# Patient Record
Sex: Female | Born: 1980 | Hispanic: No | Smoking: Never smoker
Health system: Southern US, Community
[De-identification: ages and names within clinical notes are randomized; demographics above are authoritative.]

## PROBLEM LIST (undated history)

## (undated) ENCOUNTER — Inpatient Hospital Stay (HOSPITAL_COMMUNITY): Payer: Self-pay

## (undated) DIAGNOSIS — O99119 Other diseases of the blood and blood-forming organs and certain disorders involving the immune mechanism complicating pregnancy, unspecified trimester: Secondary | ICD-10-CM

## (undated) DIAGNOSIS — D696 Thrombocytopenia, unspecified: Secondary | ICD-10-CM

## (undated) DIAGNOSIS — F419 Anxiety disorder, unspecified: Secondary | ICD-10-CM

## (undated) DIAGNOSIS — F32A Depression, unspecified: Secondary | ICD-10-CM

## (undated) DIAGNOSIS — F329 Major depressive disorder, single episode, unspecified: Secondary | ICD-10-CM

## (undated) DIAGNOSIS — D509 Iron deficiency anemia, unspecified: Secondary | ICD-10-CM

## (undated) DIAGNOSIS — O09529 Supervision of elderly multigravida, unspecified trimester: Secondary | ICD-10-CM

## (undated) DIAGNOSIS — O09299 Supervision of pregnancy with other poor reproductive or obstetric history, unspecified trimester: Secondary | ICD-10-CM

## (undated) DIAGNOSIS — Z22330 Carrier of Group B streptococcus: Secondary | ICD-10-CM

## (undated) DIAGNOSIS — Z3480 Encounter for supervision of other normal pregnancy, unspecified trimester: Secondary | ICD-10-CM

## (undated) DIAGNOSIS — K219 Gastro-esophageal reflux disease without esophagitis: Secondary | ICD-10-CM

## (undated) DIAGNOSIS — D649 Anemia, unspecified: Secondary | ICD-10-CM

## (undated) HISTORY — DX: Thrombocytopenia, unspecified: D69.6

## (undated) HISTORY — DX: Supervision of pregnancy with other poor reproductive or obstetric history, unspecified trimester: O09.299

## (undated) HISTORY — DX: Carrier of group B Streptococcus: Z22.330

## (undated) HISTORY — DX: Supervision of elderly multigravida, unspecified trimester: O09.529

## (undated) HISTORY — DX: Iron deficiency anemia, unspecified: D50.9

## (undated) HISTORY — DX: Encounter for supervision of other normal pregnancy, unspecified trimester: Z34.80

## (undated) HISTORY — DX: Other diseases of the blood and blood-forming organs and certain disorders involving the immune mechanism complicating pregnancy, unspecified trimester: O99.119

## (undated) HISTORY — PX: ESOPHAGUS SURGERY: SHX626

---

## 2002-09-03 ENCOUNTER — Encounter: Admission: RE | Admit: 2002-09-03 | Discharge: 2002-09-03 | Payer: Self-pay | Admitting: Family Medicine

## 2002-09-06 ENCOUNTER — Encounter: Admission: RE | Admit: 2002-09-06 | Discharge: 2002-09-06 | Payer: Self-pay | Admitting: Family Medicine

## 2002-09-24 ENCOUNTER — Encounter: Admission: RE | Admit: 2002-09-24 | Discharge: 2002-09-24 | Payer: Self-pay | Admitting: Family Medicine

## 2002-10-22 ENCOUNTER — Encounter: Admission: RE | Admit: 2002-10-22 | Discharge: 2002-10-22 | Payer: Self-pay | Admitting: Sports Medicine

## 2002-12-17 ENCOUNTER — Encounter: Admission: RE | Admit: 2002-12-17 | Discharge: 2002-12-17 | Payer: Self-pay | Admitting: Family Medicine

## 2003-01-25 ENCOUNTER — Ambulatory Visit (HOSPITAL_COMMUNITY): Admission: RE | Admit: 2003-01-25 | Discharge: 2003-01-25 | Payer: Self-pay | Admitting: Gastroenterology

## 2003-03-07 ENCOUNTER — Encounter: Admission: RE | Admit: 2003-03-07 | Discharge: 2003-03-07 | Payer: Self-pay | Admitting: Gastroenterology

## 2003-04-23 ENCOUNTER — Ambulatory Visit (HOSPITAL_COMMUNITY): Admission: RE | Admit: 2003-04-23 | Discharge: 2003-04-23 | Payer: Self-pay | Admitting: Gastroenterology

## 2003-04-26 ENCOUNTER — Encounter: Admission: RE | Admit: 2003-04-26 | Discharge: 2003-04-26 | Payer: Self-pay | Admitting: Family Medicine

## 2003-04-29 ENCOUNTER — Encounter: Admission: RE | Admit: 2003-04-29 | Discharge: 2003-04-29 | Payer: Self-pay | Admitting: Family Medicine

## 2003-05-10 ENCOUNTER — Ambulatory Visit (HOSPITAL_COMMUNITY): Admission: RE | Admit: 2003-05-10 | Discharge: 2003-05-10 | Payer: Self-pay | Admitting: Gastroenterology

## 2003-09-27 ENCOUNTER — Inpatient Hospital Stay (HOSPITAL_COMMUNITY): Admission: RE | Admit: 2003-09-27 | Discharge: 2003-09-29 | Payer: Self-pay | Admitting: General Surgery

## 2004-02-10 ENCOUNTER — Ambulatory Visit: Payer: Self-pay | Admitting: Family Medicine

## 2004-03-18 ENCOUNTER — Ambulatory Visit: Payer: Self-pay | Admitting: Family Medicine

## 2004-03-24 ENCOUNTER — Encounter: Admission: RE | Admit: 2004-03-24 | Discharge: 2004-03-24 | Payer: Self-pay | Admitting: Family Medicine

## 2004-09-07 ENCOUNTER — Emergency Department (HOSPITAL_COMMUNITY): Admission: EM | Admit: 2004-09-07 | Discharge: 2004-09-08 | Payer: Self-pay | Admitting: Emergency Medicine

## 2004-09-18 ENCOUNTER — Encounter: Admission: RE | Admit: 2004-09-18 | Discharge: 2004-09-18 | Payer: Self-pay | Admitting: Family Medicine

## 2005-03-01 ENCOUNTER — Ambulatory Visit: Payer: Self-pay | Admitting: Family Medicine

## 2005-03-23 ENCOUNTER — Encounter: Admission: RE | Admit: 2005-03-23 | Discharge: 2005-03-23 | Payer: Self-pay | Admitting: Internal Medicine

## 2005-04-05 ENCOUNTER — Ambulatory Visit: Payer: Self-pay | Admitting: Family Medicine

## 2006-05-12 DIAGNOSIS — N6029 Fibroadenosis of unspecified breast: Secondary | ICD-10-CM | POA: Insufficient documentation

## 2006-05-12 DIAGNOSIS — K219 Gastro-esophageal reflux disease without esophagitis: Secondary | ICD-10-CM | POA: Insufficient documentation

## 2008-02-05 ENCOUNTER — Ambulatory Visit: Payer: Self-pay | Admitting: Family Medicine

## 2008-02-06 ENCOUNTER — Telehealth: Payer: Self-pay | Admitting: Family Medicine

## 2009-04-24 ENCOUNTER — Ambulatory Visit (HOSPITAL_COMMUNITY): Admission: RE | Admit: 2009-04-24 | Discharge: 2009-04-24 | Payer: Self-pay | Admitting: General Surgery

## 2009-05-07 ENCOUNTER — Ambulatory Visit (HOSPITAL_COMMUNITY): Admission: RE | Admit: 2009-05-07 | Discharge: 2009-05-07 | Payer: Self-pay | Admitting: Gastroenterology

## 2009-05-26 ENCOUNTER — Encounter: Admission: RE | Admit: 2009-05-26 | Discharge: 2009-05-26 | Payer: Self-pay | Admitting: Gastroenterology

## 2009-09-21 ENCOUNTER — Emergency Department (HOSPITAL_COMMUNITY): Admission: EM | Admit: 2009-09-21 | Discharge: 2009-09-22 | Payer: Self-pay | Admitting: Emergency Medicine

## 2010-07-31 NOTE — Op Note (Signed)
[name], [name]                         ACCOUNT NO.:  [id number]   MEDICAL RECORD NO.:  [id number]                   PATIENT TYPE:  OBV   LOCATION:  0442                                 FACILITY:  [facility]   PHYSICIAN:  Sandria Bales. Ezzard Standing, M.D.               DATE OF BIRTH:  1980-03-31   DATE OF PROCEDURE:  09/26/2003  DATE OF DISCHARGE:                                 OPERATIVE REPORT   PREOPERATIVE DIAGNOSIS:  Achalasia, status post Heller myotomy.   POSTOPERATIVE DIAGNOSIS:  Achalasia, status post Heller myotomy.   PROCEDURE:  Esophagogastroscopy.   SURGEON:  Sandria Bales. Ezzard Standing, M.D.   ANESTHESIA:  General endotracheal.   INDICATIONS FOR PROCEDURE:  Hannah Orozco is a 30 year old Hispanic female  who is a patient of Dr. Avel Peace.  He has done a laparoscopic Heller  myotomy.  I am doing an upper endoscopy to document the adequacy of a  myotomy and to make sure there is no mucosal injury or leak.  With Dr.  Abbey Chatters manning the endoscope with patient under general anesthesia under  laparoscopy, I passed a flexible Olympus endoscope without difficulty.  I  found the Z line at about 31-32 cm.  The mucosal divided in the stomach and  appears to be approximately 2 cm beyond the Z line down to about 34 cm.  Dr.  Abbey Chatters did some of the dissection while I was doing the endoscopy.  At  the end, I then insufflated the esophagus and stomach while he flooded the  upper abdomen, and there was no bubbling or evidence of leak.   Her stomach was otherwise normal.  Her distal esophagus actually looked  fairly normal with minimal, if any, dilatation.  With the anastomosis wide  open, I did take some photos of this.   Dr. Abbey Chatters will dictated the laparoscopic Heller myotomy.                                               Sandria Bales. Ezzard Standing, M.D.    DHN/MEDQ  D:  09/26/2003  T:  09/26/2003  Job:  [id number]

## 2010-07-31 NOTE — Op Note (Signed)
NAME:  Hannah Orozco, FETTEROLF                         ACCOUNT NO.:  [id number]   MEDICAL RECORD NO.:  [id number]                   PATIENT TYPE:   LOCATION:                                       FACILITY:   PHYSICIAN:  John C. Madilyn Fireman, M.D.                 DATE OF BIRTH:   DATE OF PROCEDURE:  01/25/2003  DATE OF DISCHARGE:                                 OPERATIVE REPORT   PROCEDURE:  Esophagogastroduodenoscopy with esophageal dilatation.   INDICATIONS:  Solid food __________ and liquid dysphagia.   DESCRIPTION OF PROCEDURE:  The patient was placed in the left lateral  decubitus position and placed on the pulse monitor with continuous low-flow  oxygen delivered by nasal cannula.  She was sedated with 75 mcg IV fentanyl  and 8 mg IV Versed.  The video endoscope was advanced under direct vision  into the oropharynx and esophagus.  The esophagus was straight and of normal  caliber, the squamocolumnar line at 38 cm. The GE junction area did not  yield readily to gentle pressure with the scope and there was some  resistance to passage of the scope beyond it.  But eventual increased force  of the scope popped through the GE junction into the stomach.  I withdrew  back into the distal esophagus and carefully inspected the area but did not  see any abnormalities.  There was no friability or visible ring or  stricture.  The stomach was re-entered and a small amount of liquid  secretions were suctioned from the fundus.  Retroflexed view of the cardia  was unremarkable.  The fundus, body, antrum, and pylorus all appeared  normal.  Duodenum was entered and both bulb and second portion were well  inspected and appeared to be within normal limits.   Savary guide wire was placed through the endoscope channel and the scope  withdrawn.  Savary dilators of 16 and 17 mm were passed over the guide wire  with mild resistance to the second dilator and no blood seen on either  dilator.  The last dilators were  removed together with wire.  The patient  returned to the recovery room in stable condition.  She tolerated the  procedure well and there were no immediate complications.   IMPRESSION:  Possible increased resistance at the gastroesophageal junction,  significance unclear, status post empiric dilatation to 17 mm.   PLAN:  Advance diet and observe response to dilatation.                                               John C. Madilyn Fireman, M.D.    JCH/MEDQ  D:  01/25/2003  T:  01/25/2003  Job:  [id number]   cc:   Deniece Portela A. Sheffield Slider, M.D.  [address]  [place]  [place] [address]  Fax: [phone]

## 2010-07-31 NOTE — Op Note (Signed)
Hannah Orozco, Hannah Orozco                         ACCOUNT NO.:  [id number]   MEDICAL RECORD NO.:  [id number]                   PATIENT TYPE:  OBV   LOCATION:  0442                                 FACILITY:  [facility]   PHYSICIAN:  Adolph Pollack, M.D.            DATE OF BIRTH:  03/26/1980   DATE OF PROCEDURE:  09/26/2003  DATE OF DISCHARGE:                                 OPERATIVE REPORT   PREOPERATIVE DIAGNOSIS:  Achalasia.   POSTOPERATIVE DIAGNOSIS:  Achalasia.   PROCEDURES:  1. Laparoscopic Heller myotomy.  2. Endoscopy, Dr. Ezzard Standing.   SURGEON:  Adolph Pollack, M.D.   ASSISTANT:  Dr. Ezzard Standing.   ANESTHESIA:  General.   INDICATIONS:  This is a 30 year old female who has been having problems with  swallowing and weight loss.  She had a barium swallow suggestive of  achalasia as well as abnormal manometry suggesting achalasia.  She has been  dilated here but has not responded well to that.  She regurgitates food.  She was evaluated by Dr. Dorena Cookey, and she was sent to me to discuss  operative management, which she is interested in, and presents now.   TECHNIQUE:  She is seen in the holding area and brought to the operating  room, placed supine on the operating table, then a general anesthetic was  administered.  A Foley catheter was placed in her bladder, and her abdominal  wall was sterilely prepped and draped.  Dilute Marcaine solution was  infiltrated in the subumbilical region, and an incision was made through the  skin, subcutaneous tissue, and midline fascia until the peritoneal cavity  was entered.  A purse-string suture of 0 Vicryl was placed around the  vaginal edges.  A Hasson trocar was introduced into the peritoneal cavity,  and a pneumoperitoneum was created by insufflation of CO2 gas.  Next, the  laparoscope was introduced.  Under direct vision, a 5 mm trocar was placed  in the right upper quadrant region.  A subxiphoid incision was made, and a  Nathanson  liver retractor was placed into the abdominal cavity, and the left  lobe of the liver was retracted anteriorly, exposing the GE junction.  A 1  cm incision was made in the epigastric region just to the right of the  midline, and a 10 mm trocar placed through this.  Two 5 mm trocars were  placed in the left upper quadrant.  The gastrohepatic ligament was then  divided up towards the right cruciate and harmonic scalpel, and the  phrenoesophageal ligament divided over the anterior portion of the  esophagus, exposing the left ____________.  Using careful blunt dissection,  a retroesophageal window was created, and a Penrose drain wrapped around  this.  I then used blunt dissection to dissect around the esophagus, up into  the mediastinum.   Following this, I used a low setting of electrocautery and then divided  longitudinal fibers of the  esophagus from approximately 5 cm proximal to the  GE junction and 2 cm down onto the stomach.  I then identified the circular  fibers and then divided them in a similar fashion, using minimal, if any,  cautery here.  I then made sure that I dissected the muscle fibers back,  creating a 180 degree fundoplication.  No obvious mucosal injury was noted.  The anterior vagus nerve was preserved.   Following this, Dr. Ezzard Standing performed an upper endoscopy.  He identified the  Z line and noted that the gastroesophageal junction was widely patent.  The  myotomy extended 2 cm down into the stomach.  I had him injury air, and I  injected fluid over the myotomy site, and no leak was noted.  The gas was  then evacuated from the stomach, and the scope removed.  I inspected the  area, and hemostasis was adequate.  I then evacuated as much of the  irrigation fluid as possible.  I removed the [facility] liver retractor under  direct vision.  The remaining trocars were then removed under direct vision,  and a pneumoperitoneum was released.  The subumbilical fascial defect was   closed by tightening up and tying down the purse-string suture.  The skin  incisions were closed with 4-0 Monocryl subcuticular stitches.  Steri-Strips  and sterile dressings were applied.   She tolerated the procedure well without any apparent complications.  She  was subsequently taken to the recovery room extubated in satisfactory  condition.  She will be kept n.p.o. tonight, and we will do a Gastrografin  swallow in the morning.                                               Adolph Pollack, M.D.    Kari Baars  D:  09/26/2003  T:  09/26/2003  Job:  [id number]   cc:   Everardo All. Madilyn Fireman, M.D.  [address], [address]  [place]  [place] [address]  Fax: [phone]

## 2010-07-31 NOTE — Op Note (Signed)
NAME:  Hannah Orozco, AGUADO                         ACCOUNT NO.:  [id number]   MEDICAL RECORD NO.:  [id number]                   PATIENT TYPE:  AMB   LOCATION:  ENDO                                 FACILITY:  [facility]   PHYSICIAN:  John C. Madilyn Fireman, M.D.                 DATE OF BIRTH:  06/26/1980   DATE OF PROCEDURE:  05/10/2003  DATE OF DISCHARGE:                                 OPERATIVE REPORT   PROCEDURE:  Esophagogastroduodenoscopy with injection of botulinum toxin.   INDICATIONS:  Dysphagia with manometry showing probable achalasia.   DESCRIPTION OF PROCEDURE:  The patient was placed in the left lateral  decubitus position and placed on the pulse monitor with continuous low-flow  oxygen delivered by nasal cannula.  She was sedated with 50 mcg IV fentanyl  and 7 mg IV Versed.  The Olympus video endoscope was advanced under direct  vision into the oropharynx and esophagus.  The esophagus was straight and of  normal caliber with the squamocolumnar line at 38 cm.  The LES was not seen  to relax during the procedure but there was no apparent stricture.  The  scope was passed into the stomach with slight resistance.  Retroflexed view  of the cardia was unremarkable.  The fundus, body, antrum, and pylorus all  appeared normal.  The duodenum was entered and both the bulb and second  portion were well inspected and appeared to be within normal limits.  The  scope was withdrawn back to the GE junction and the LES was injected with  four 25 unit aliquots of botulinum toxin.  The scope has been withdrawn and  the patient returned to the recovery room in stable condition.  She  tolerated the procedure well and there were no immediate complications.   IMPRESSION:  Normal endoscopy with findings consistent with achalasia,  status post botulinum toxin injection.   PLAN:  Advance diet and observe response to injection therapy.                                               John C. Madilyn Fireman, M.D.    JCH/MEDQ  D:  05/10/2003  T:  05/10/2003  Job:  [id number]   cc:   [employer]

## 2010-07-31 NOTE — Op Note (Signed)
NAME:  AADVIKA, KONEN                       ACCOUNT NO.:  [id number]   MEDICAL RECORD NO.:  [id number]                   PATIENT TYPE:  AMB   LOCATION:  ENDO                                 FACILITY:  [facility]   PHYSICIAN:  John C. Madilyn Fireman, M.D.                 DATE OF BIRTH:  11/08/1980   DATE OF PROCEDURE:  04/23/2003  DATE OF DISCHARGE:  04/23/2003                                 OPERATIVE REPORT   PROCEDURE:  Esophageal motility study.   INDICATIONS FOR PROCEDURE:  Persistent dysphagia with abnormal barium  swallow.  No response to esophageal dilatation.   RESULTS:  A. Upper esophageal sphincter:  Not interpreted.  B. Esophageal body:  90% simultaneous or retrograde contractions after     administration of wet swallows with generally low amplitude contractions.  C. Lower esophageal sphincter:  Normal pressure at 35 mm, abnormally high     residual pressure and abnormal relaxation at 42%.   IMPRESSION:  Severe diffuse esophageal spasm with probable transition to  achalasia.   PLAN:  Will discuss possible treatment options of Botox injections,  pneumatic dilatation, or Heller myotomy.                                               John C. Madilyn Fireman, M.D.    JCH/MEDQ  D:  04/28/2003  T:  04/28/2003  Job:  [id number]

## 2012-09-07 ENCOUNTER — Other Ambulatory Visit: Payer: Self-pay

## 2012-09-07 DIAGNOSIS — Z331 Pregnant state, incidental: Secondary | ICD-10-CM

## 2012-09-07 LAB — HIV ANTIBODY (ROUTINE TESTING W REFLEX): HIV: NONREACTIVE

## 2012-09-07 NOTE — Progress Notes (Signed)
OB LABS DONE TODAY Starletta Houchin 

## 2012-09-08 LAB — CULTURE, OB URINE
Colony Count: NO GROWTH
Organism ID, Bacteria: NO GROWTH

## 2012-09-08 LAB — OBSTETRIC PANEL
Antibody Screen: NEGATIVE
Basophils Absolute: 0 10*3/uL (ref 0.0–0.1)
Basophils Relative: 0 % (ref 0–1)
Eosinophils Absolute: 0.1 10*3/uL (ref 0.0–0.7)
Eosinophils Relative: 1 % (ref 0–5)
HCT: 33 % — ABNORMAL LOW (ref 36.0–46.0)
Hemoglobin: 10.9 g/dL — ABNORMAL LOW (ref 12.0–15.0)
Hepatitis B Surface Ag: NEGATIVE
Lymphocytes Relative: 24 % (ref 12–46)
Lymphs Abs: 1.2 10*3/uL (ref 0.7–4.0)
MCH: 29.8 pg (ref 26.0–34.0)
MCHC: 33 g/dL (ref 30.0–36.0)
MCV: 90.2 fL (ref 78.0–100.0)
Monocytes Absolute: 0.5 10*3/uL (ref 0.1–1.0)
Monocytes Relative: 9 % (ref 3–12)
Neutro Abs: 3.5 10*3/uL (ref 1.7–7.7)
Neutrophils Relative %: 66 % (ref 43–77)
Platelets: 173 10*3/uL (ref 150–400)
RBC: 3.66 MIL/uL — ABNORMAL LOW (ref 3.87–5.11)
RDW: 15.2 % (ref 11.5–15.5)
Rh Type: POSITIVE
Rubella: 7.92 Index — ABNORMAL HIGH (ref <0.90)
WBC: 5.3 10*3/uL (ref 4.0–10.5)

## 2012-09-08 LAB — SICKLE CELL SCREEN: Sickle Cell Screen: NEGATIVE

## 2012-09-14 ENCOUNTER — Ambulatory Visit (INDEPENDENT_AMBULATORY_CARE_PROVIDER_SITE_OTHER): Payer: Self-pay | Admitting: Family Medicine

## 2012-09-14 ENCOUNTER — Encounter: Payer: Self-pay | Admitting: Family Medicine

## 2012-09-14 VITALS — BP 94/56 | Temp 98.1°F | Wt 87.2 lb

## 2012-09-14 DIAGNOSIS — Z3400 Encounter for supervision of normal first pregnancy, unspecified trimester: Secondary | ICD-10-CM

## 2012-09-14 DIAGNOSIS — D649 Anemia, unspecified: Secondary | ICD-10-CM

## 2012-09-14 DIAGNOSIS — Z3401 Encounter for supervision of normal first pregnancy, first trimester: Secondary | ICD-10-CM

## 2012-09-14 MED ORDER — PRENATAL FORTE PO TABS: 1.0000 | ORAL_TABLET | Freq: Every day | ORAL | Status: DC

## 2012-09-14 NOTE — Patient Instructions (Addendum)
MUCHAS FELICIDADES! Ha sido un placer verle hoy. - Por favor tome las medicinas como se le han recetado. - Yo le comunicare si los resultados de sus analisis estan [place], de lo contrario lo conversaremos en su proxima cita. - Haga su proxima cita en 4 semanas o antes si lo necesita.

## 2012-09-14 NOTE — Progress Notes (Signed)
First Prenatal Visit. HPI Visit conducted in [place].  32 y/o F G1P0 with LMP 07/13/12 for a GA of 9.0 weeks today that comes for her first OB visit. Her menses are reported to be regular so her LMP and dating is reliable.  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 Her only symptom described is occasional heartburn for which she takes PPI PRN. After being pregnant she was switched to Zantac which she takes as needed.  She also reports hx of anemia for  the past 3 years with no treatment or cause identified. Pregnancy was desired but not planned. 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  Pt denies genetic testing.  Physical exam: Gen:  NAD HEENT: Moist mucous membranes. Neck supple. No JVD. No adenopathies. No masses. Breasts: normal breast tissue, no masses. Normal nipple/areola anatomy. No discharge. CV: Regular rate and rhythm, no murmurs rubs or gallops PULM: Clear to auscultation bilaterally. No wheezes/rales/rhonchi ABD: Soft, non tender, non distended, normal bowel sounds EXT: No edema Neuro: Alert and oriented x3. No focalization  Psych: Normal mood, normal affect. Normal speech. Normal though process. No hallucinations/ delusions. No agitation. GYN: Vulva and perianal area: Normal            Speculum: Vagina and cervix of normal appearance, no friability, no discharge.           Bimanual exam: Uterus anteverted increased in size corresponding with ~9 weeks pregnancy no adnexal masses. No cervical motion           tenderness. A/P Labs reviewed. Anemia of 10.9. Pt brings labs done last month with Hb in 11.0 and MCV 92. Will order vit B12, Folate levels as well as iron studies. Continue with Fortified Prenatal Tab. Discussed about balanced diet. GERD: continue with current regimen. Pap smear obtained today. GC and Chlamydia pending.  Early GTT  on her next visit. Anemia panel ( Iron , Folate and B12) F/u every 4 weeks until 28 weeks.

## 2012-09-15 ENCOUNTER — Encounter: Payer: Self-pay | Admitting: Family Medicine

## 2012-09-15 DIAGNOSIS — Z3400 Encounter for supervision of normal first pregnancy, unspecified trimester: Secondary | ICD-10-CM | POA: Insufficient documentation

## 2012-10-04 NOTE — Progress Notes (Signed)
Note reviewed and I agree with Dr Willaim Rayas assessment and plan as documented in this note. JB

## 2012-10-12 ENCOUNTER — Encounter: Payer: Self-pay | Admitting: Family Medicine

## 2012-10-19 ENCOUNTER — Ambulatory Visit (INDEPENDENT_AMBULATORY_CARE_PROVIDER_SITE_OTHER): Payer: Self-pay | Admitting: Family Medicine

## 2012-10-19 DIAGNOSIS — D649 Anemia, unspecified: Secondary | ICD-10-CM

## 2012-10-19 DIAGNOSIS — Z3400 Encounter for supervision of normal first pregnancy, unspecified trimester: Secondary | ICD-10-CM

## 2012-10-19 DIAGNOSIS — Z3401 Encounter for supervision of normal first pregnancy, first trimester: Secondary | ICD-10-CM

## 2012-10-19 LAB — GLUCOSE, CAPILLARY
Comment 1: 1
Glucose-Capillary: 102 mg/dL — ABNORMAL HIGH (ref 70–99)

## 2012-10-19 MED ORDER — VITAMIN B-6 25 MG PO TABS: 25.0000 mg | ORAL_TABLET | Freq: Every day | ORAL | Status: DC

## 2012-10-19 MED ORDER — DOXYLAMINE SUCCINATE 5 MG PO CHEW: 2.0000 | CHEWABLE_TABLET | Freq: Every day | ORAL | Status: DC

## 2012-10-19 NOTE — Patient Instructions (Addendum)
Hiperemesis gravídica   (Hyperemesis Gravidarum)  La hiperemesis gravídica es una forma grave de náuseas y vómitos que ocurren durante el embarazo Es peor que las náuseas matutinas. Puede hacer que una mujer sufra náuseas o vómitos todo el día, durante varios días. Hace que evite comer y beber la cantidad que necesita de alimentos y líquidos Generalmente ocurre durante la primera mitad (las primeras 20 semanas) de embarazo. En general mejora en la segunda mitad del embarazo. Pero en algunos casos continua durante todo el embarazo.   CAUSAS  Las causas no se conocen pero se cree que se produce debido a las modificaciones en las hormonas del organismo durante el embarazo, y en un aumento del estrógeno.   SÍNTOMAS   · Náuseas y vómitos intensos.  · Náuseas no mejoran.  · Vómitos que le impiden retener los alimentos.  · Pérdida de peso y de líquidos corporales (deshidratación).  · No tener deseos de comer ni tener agrado por los alimentos que antes disfrutaba.  DIAGNÓSTICO  Su médico le preguntará cuáles son sus síntomas. También le indicará análisis de sangre y de orina para asegurarse que no hay otra causa del problema.   TRATAMIENTO  Posiblemente sólo sea necesario que tome algunos medicamentos para controlar el problema. Si los medicamentos no controlan los síntomas, será tratada en el hospital para prevenir la deshidratación, la acidosis, la pérdida de peso y las modificaciones electrolíticas en el organismo que podrían perjudicar al bebé. Probablemente le administren líquidos y medicamentos por vía intravenosa para tratar el problema.   INSTRUCCIONES PARA EL CUIDADO DOMICILIARIO  · Tome todos los medicamentos que le indicó su médico.  · Trate de comer algunas galletitas secas o tostadas durante la mañana, antes de levantarse de la cama.  · Evite los alimentos y los olores que le desagradan.  · Evite los alimentos muy condimentados. Coma 5 o 6 comidas pequeñas por día.  · No beba mientras come, beba entre las  comidas.  · Para las colaciones, consuma alimentos ricos en proteínas, como queso. Coma o succione alimentos que contengan jengibre. El jengibre mejora las náuseas.  · Evite preparar los alimentos. El olor de la comida puede quitarle el apetito.  · Evite los suplementos de hierro y las multivitaminas que contengan hierro hasta después del 3° o 4° mes de embarazo.  SOLICITE ATENCIÓN MÉDICA SI:  · El dolor abdominal aumenta desde la última vez que concurrió al médico.  · Sufre una cefalea grave.  · Tiene problemas visuales.  · Siente que está perdiendo peso.  SOLICITE ATENCIÓN MÉDICA DE INMEDIATO SI:   · No puede retener líquidos.  · Vomita sangre.  · Las náuseas y los vómitos persisten.  · Tiene fiebre.  · Presenta debilidad excesiva, mareos, lipotimia o sed extrema.  ESTÉ SEGURO QUE:   · Comprende las instrucciones para el alta médica.  · Controlará su enfermedad.  · Solicitará atención médica de inmediato según las indicaciones.  Document Released: 03/01/2005 Document Revised: 05/24/2011  [employer]® Patient Information ©2014 [employer].

## 2012-10-19 NOTE — Progress Notes (Signed)
[name] is a 32 y.o. G1P0 at [redacted]w[redacted]d for routine follow up.  She reports pain on her right wrist worse at night. Also continue to have nausea in the morning that resolves during the rest of the day. No active vomiting. No other symptoms See flow sheet for details.  A/P: Pregnancy at [redacted]w[redacted]d.  Doing well.   Pregnancy issues include: PHx positive for Esophago-Gastric junction inflammation s/p surgery. Anemia and low pregravid weight. Carpal tunnel syndrome. Hb was 10.3 and normocytic. Anemia studies drawn today as well as 1hGTT. Negative pap smear and GC/CT.  Anatomy ultrasound ordered to be scheduled at 20- weeks Pt  is not interested in genetic screening. Wrist splint at night. Pyridoxine and Doxylamine. Will check UA at  Her next appointment if no improvement with current therapy. Bleeding and pain precautions reviewed. Follow up 4 weeks or sooner pending on anemia work up. Continue Iron fortified prenatal tabs. Will send pt to [facility] clinic after her next appointment.

## 2012-10-20 LAB — FOLATE: Folate: >20 ng/mL

## 2012-10-20 LAB — IRON AND TIBC
%SAT: 16 % — ABNORMAL LOW (ref 20–55)
Iron: 62 ug/dL (ref 42–145)
TIBC: 388 ug/dL (ref 250–470)
UIBC: 326 ug/dL (ref 125–400)

## 2012-10-20 LAB — VITAMIN B12: Vitamin B-12: 574 pg/mL (ref 211–911)

## 2012-10-20 LAB — FERRITIN: Ferritin: 12 ng/mL (ref 10–291)

## 2012-10-25 ENCOUNTER — Telehealth: Payer: Self-pay | Admitting: Family Medicine

## 2012-10-25 NOTE — Telephone Encounter (Signed)
Pt called and stated that is having problem finding the medication from the last visit, pt been shopping around but it been unsuccessful pt will like Dr change RX.  Thank You  Marines

## 2012-11-16 ENCOUNTER — Ambulatory Visit (INDEPENDENT_AMBULATORY_CARE_PROVIDER_SITE_OTHER): Payer: Self-pay | Admitting: Family Medicine

## 2012-11-16 VITALS — BP 90/54 | Temp 98.6°F | Wt 94.7 lb

## 2012-11-16 DIAGNOSIS — K219 Gastro-esophageal reflux disease without esophagitis: Secondary | ICD-10-CM

## 2012-11-16 DIAGNOSIS — Z3401 Encounter for supervision of normal first pregnancy, first trimester: Secondary | ICD-10-CM

## 2012-11-16 DIAGNOSIS — Z3400 Encounter for supervision of normal first pregnancy, unspecified trimester: Secondary | ICD-10-CM

## 2012-11-16 MED ORDER — PRENATAL FORTE PO TABS: 1.0000 | ORAL_TABLET | Freq: Every day | ORAL | Status: DC

## 2012-11-16 MED ORDER — RANITIDINE HCL 150 MG PO TABS: 150.0000 mg | ORAL_TABLET | Freq: Two times a day (BID) | ORAL | Status: DC

## 2012-11-16 NOTE — Patient Instructions (Signed)
Continua tomando las tabletas prenatales. Se te va a coodinar una cita para Ulrasonido con Programme researcher, broadcasting/film/video. Si necesitas cambiarla puedes llamar a su oficina. Haz un a cita de seguimiento conmigo en 4 semanas.

## 2012-11-16 NOTE — Progress Notes (Signed)
Hannah Orozco is a 32 y.o. G1P0 at [redacted]w[redacted]d for routine follow up.  She reports no complaints See flow sheet for details.  A/P: Pregnancy at [redacted]w[redacted]d.  Doing well.   Pregnancy issues include GERD and Anemia. Anemia workup only positive for low Iron saturation. Normal B12 and Folate levels. Anatomy ultrasound ordered today Continue with Iron fortified prenatal tab. Bleeding and pain precautions reviewed. Follow up 4 weeks.

## 2012-12-18 ENCOUNTER — Encounter: Payer: Self-pay | Admitting: Family Medicine

## 2012-12-20 ENCOUNTER — Ambulatory Visit (INDEPENDENT_AMBULATORY_CARE_PROVIDER_SITE_OTHER): Payer: Self-pay | Admitting: Family Medicine

## 2012-12-20 VITALS — BP 95/56 | Temp 98.1°F | Wt 100.2 lb

## 2012-12-20 DIAGNOSIS — Z3400 Encounter for supervision of normal first pregnancy, unspecified trimester: Secondary | ICD-10-CM

## 2012-12-20 DIAGNOSIS — Z3402 Encounter for supervision of normal first pregnancy, second trimester: Secondary | ICD-10-CM

## 2012-12-20 NOTE — Patient Instructions (Signed)
Usted y su beb estn bien.  Siga tomando sus vitaminas prenatales.  Si usted tiene [place] pregunta o inquietud, no Paramedic.  Por favor, el seguimiento en 4 semanas. Evaluacin de los movimientos fetales  (Fetal Movement Counts) Nombre del paciente: __________________________________________________ Micheline Chapman estimada: ____________________ Caroleen Hamman de los movimientos fetales es muy recomendable en los embarazos de alto riesgo, pero tambin es una buena idea que lo hagan todas las [place]. El Firefighter que comience a contarlos a las 28 semanas de [place]. Los movimientos fetales suelen aumentar:   Despus de Animator.  Despus de la actividad fsica.  Despus de comer o beber [employer] o fro.  En reposo. Preste atencin cuando sienta que el beb est ms activo. Esto le ayudar a notar un patrn de ciclos de vigilia y sueo de su beb y cules son los factores que contribuyen a un aumento de los movimientos fetales. Es importante llevar a cabo un recuento de movimientos fetales, al mismo tiempo cada da, cuando el beb normalmente est ms activo.  CMO CONTAR LOS MOVIMIENTOS FETALES 1. Busque un lugar tranquilo y cmodo para sentarse o recostarse sobre el lado izquierdo. Al recostarse sobre su lado izquierdo, le proporciona una mejor circulacin de [place] y oxgeno al beb. 2. Anote el da y la hora en una hoja de papel o en un diario. 3. Comience contando las pataditas, revoloteos, chasquidos, vueltas o pinchazos en un perodo de 2 horas. Debe sentir al menos 10 movimientos en 2 horas. 4. Si no siente 10 movimientos en 2 horas, espere 2  3 horas y cuente de nuevo. Busque cambios en el patrn o si no cuenta lo suficiente en 2 horas. SOLICITE ATENCIN MDICA SI:   Siente menos de 10 pataditas en 2 horas, en dos intentos.  No hay movimientos durante una hora.  El patrn se modifica o le lleva ms tiempo Art gallery manager las 10 pataditas.  Siente  que el beb no se mueve como lo hace habitualmente. Fecha: ____________ Movimientos: ____________ Stevan Born inicio: ____________ Stevan Born finalizacin: ____________  Franco Nones: ____________ Movimientos: ____________ Stevan Born inicio: ____________ Stevan Born finalizacin: ____________  Franco Nones: ____________ Movimientos: ____________ Stevan Born inicio: ____________ Stevan Born finalizacin: ____________  Franco Nones: ____________ Movimientos: ____________ Stevan Born inicio: ____________ Stevan Born finalizacin: ____________  Franco Nones: ____________ Movimientos: ____________ Stevan Born inicio: ____________ Mammie Russian de finalizacin: ____________  Franco Nones: ____________ Movimientos: ____________ Mammie Russian de inicio: ____________ Mammie Russian de finalizacin: ____________  Franco Nones: ____________ Movimientos: ____________ Mammie Russian de inicio: ____________ Mammie Russian de finalizacin: ____________  Franco Nones: ____________ Movimientos: ____________ Mammie Russian de inicio: ____________ Mammie Russian de finalizacin: ____________  Franco Nones: ____________ Movimientos: ____________ Mammie Russian de inicio: ____________ Mammie Russian de finalizacin: ____________  Franco Nones: ____________ Movimientos: ____________ Mammie Russian de inicio: ____________ Mammie Russian de finalizacin: ____________  Franco Nones: ____________ Movimientos: ____________ Mammie Russian de inicio: ____________ Mammie Russian de finalizacin: ____________  Franco Nones: ____________ Movimientos: ____________ Mammie Russian de inicio: ____________ Mammie Russian de finalizacin: ____________  Franco Nones: ____________ Movimientos: ____________ Mammie Russian de inicio: ____________ Mammie Russian de finalizacin: ____________  Franco Nones: ____________ Movimientos: ____________ Mammie Russian de inicio: ____________ Mammie Russian de finalizacin: ____________  Franco Nones: ____________ Movimientos: ____________ Mammie Russian de inicio: ____________ Mammie Russian de finalizacin: ____________  Franco Nones: ____________ Movimientos: ____________ Mammie Russian de inicio: ____________ Mammie Russian de finalizacin: ____________  Franco Nones: ____________ Movimientos: ____________ Mammie Russian de inicio: ____________ Mammie Russian de finalizacin:  ____________  Franco Nones: ____________ Movimientos: ____________ Stevan Born inicio: ____________ Stevan Born finalizacin: ____________  Franco Nones: ____________ Movimientos: ____________ Stevan Born inicio: ____________ Stevan Born finalizacin: ____________  Franco Nones: ____________ Movimientos: ____________  Hora de inicio: ____________ Stevan Born finalizacin: ____________  Franco Nones: ____________ Movimientos: ____________ Stevan Born inicio: ____________ Stevan Born finalizacin: ____________  Franco Nones: ____________ Movimientos: ____________ Stevan Born inicio: ____________ Stevan Born finalizacin: ____________  Franco Nones: ____________ Movimientos: ____________ Stevan Born inicio: ____________ Stevan Born finalizacin: ____________  Franco Nones: ____________ Movimientos: ____________ Stevan Born inicio: ____________ Stevan Born finalizacin: ____________  Franco Nones: ____________ Movimientos: ____________ Stevan Born inicio: ____________ Stevan Born finalizacin: ____________  Franco Nones: ____________ Movimientos: ____________ Stevan Born inicio: ____________ Mammie Russian de finalizacin: ____________  Franco Nones: ____________ Movimientos: ____________ Mammie Russian de inicio: ____________ Mammie Russian de finalizacin: ____________  Franco Nones: ____________ Movimientos: ____________ Mammie Russian de inicio: ____________ Mammie Russian de finalizacin: ____________  Franco Nones: ____________ Movimientos: ____________ Mammie Russian de inicio: ____________ Mammie Russian de finalizacin: ____________  Franco Nones: ____________ Movimientos: ____________ Mammie Russian de inicio: ____________ Mammie Russian de finalizacin: ____________  Franco Nones: ____________ Movimientos: ____________ Mammie Russian de inicio: ____________ Mammie Russian de finalizacin: ____________  Franco Nones: ____________ Movimientos: ____________ Mammie Russian de inicio: ____________ Mammie Russian de finalizacin: ____________  Franco Nones: ____________ Movimientos: ____________ Mammie Russian de inicio: ____________ Mammie Russian de finalizacin: ____________  Franco Nones: ____________ Movimientos: ____________ Mammie Russian de inicio: ____________ Mammie Russian de finalizacin: ____________  Franco Nones: ____________  Movimientos: ____________ Mammie Russian de inicio: ____________ Mammie Russian de finalizacin: ____________  Franco Nones: ____________ Movimientos: ____________ Mammie Russian de inicio: ____________ Mammie Russian de finalizacin: ____________  Franco Nones: ____________ Movimientos: ____________ Mammie Russian de inicio: ____________ Mammie Russian de finalizacin: ____________  Franco Nones: ____________ Movimientos: ____________ Mammie Russian de inicio: ____________ Mammie Russian de finalizacin: ____________  Franco Nones: ____________ Movimientos: ____________ Mammie Russian de inicio: ____________ Mammie Russian de finalizacin: ____________  Franco Nones: ____________ Movimientos: ____________ Mammie Russian de inicio: ____________ Mammie Russian de finalizacin: ____________  Franco Nones: ____________ Movimientos: ____________ Mammie Russian de inicio: ____________ Mammie Russian de finalizacin: ____________  Franco Nones: ____________ Movimientos: ____________ Mammie Russian de inicio: ____________ Mammie Russian de finalizacin: ____________  Franco Nones: ____________ Movimientos: ____________ Mammie Russian de inicio: ____________ Mammie Russian de finalizacin: ____________  Franco Nones: ____________ Movimientos: ____________ Mammie Russian de inicio: ____________ Mammie Russian de finalizacin: ____________  Franco Nones: ____________ Movimientos: ____________ Mammie Russian de inicio: ____________ Mammie Russian de finalizacin: ____________  Franco Nones: ____________ Movimientos: ____________ Mammie Russian de inicio: ____________ Mammie Russian de finalizacin: ____________  Franco Nones: ____________ Movimientos: ____________ Mammie Russian de inicio: ____________ Mammie Russian de finalizacin: ____________  Franco Nones: ____________ Movimientos: ____________ Mammie Russian de inicio: ____________ Mammie Russian de finalizacin: ____________  Franco Nones: ____________ Movimientos: ____________ Mammie Russian de inicio: ____________ Mammie Russian de finalizacin: ____________  Franco Nones: ____________ Movimientos: ____________ Mammie Russian de inicio: ____________ Mammie Russian de finalizacin: ____________  Franco Nones: ____________ Movimientos: ____________ Mammie Russian de inicio: ____________ Mammie Russian de finalizacin: ____________  Franco Nones: ____________ Movimientos: ____________ Mammie Russian de inicio:  ____________ Mammie Russian de finalizacin: ____________  Franco Nones: ____________ Movimientos: ____________ Mammie Russian de inicio: ____________ Mammie Russian de finalizacin: ____________  Franco Nones: ____________ Movimientos: ____________ Mammie Russian de inicio: ____________ Mammie Russian de finalizacin: ____________  Franco Nones: ____________ Movimientos: ____________ Mammie Russian de inicio: ____________ Mammie Russian de finalizacin: ____________  Franco Nones: ____________ Movimientos: ____________ Mammie Russian de inicio: ____________ Mammie Russian de finalizacin: ____________  Document Released: 06/08/2007 Document Revised: 02/16/2012 ExitCare Patient Information 2014 [employer].

## 2012-12-20 NOTE — Progress Notes (Signed)
Hannah Orozco is a 32 y.o. G1P0 at [redacted]w[redacted]d for routine follow up.  She reports some mid back pain.  Otherwise, she has no complaints.  She denies dysuria, vaginal bleeding, loss of fluid. She reports good fetal movement.    See flow sheet for details.  A/P: Pregnancy at [redacted]w[redacted]d.  Doing well.   Pregnancy issues include - Anemia. Patient to continue PNV.  Can consider additional Iron supplementation if needed.  Anatomy scan completed. No images/radiologist report noted in EPIC yet.  Preterm labor precautions reviewed. Follow up 4 weeks.

## 2013-01-10 ENCOUNTER — Telehealth: Payer: Self-pay | Admitting: Family Medicine

## 2013-01-10 DIAGNOSIS — D696 Thrombocytopenia, unspecified: Secondary | ICD-10-CM

## 2013-01-10 NOTE — Telephone Encounter (Signed)
Pt will like to know is she can have a dental cleaning.  [place]

## 2013-01-10 NOTE — Telephone Encounter (Signed)
Please advise.I believe patient doesn't speech English.If she has the orange card please explain orange card process takes up to six months.thank you so much. Hannah Orozco, [name]

## 2013-01-18 ENCOUNTER — Ambulatory Visit: Payer: Self-pay | Admitting: Family Medicine

## 2013-01-23 ENCOUNTER — Ambulatory Visit (INDEPENDENT_AMBULATORY_CARE_PROVIDER_SITE_OTHER): Payer: Self-pay | Admitting: Family Medicine

## 2013-01-23 VITALS — BP 94/58 | Temp 98.1°F | Wt 108.0 lb

## 2013-01-23 DIAGNOSIS — Z3480 Encounter for supervision of other normal pregnancy, unspecified trimester: Secondary | ICD-10-CM

## 2013-01-23 DIAGNOSIS — Z3482 Encounter for supervision of other normal pregnancy, second trimester: Secondary | ICD-10-CM

## 2013-01-23 DIAGNOSIS — Z23 Encounter for immunization: Secondary | ICD-10-CM

## 2013-01-23 NOTE — Patient Instructions (Signed)
Please make appointment for labwork for Thursday, Friday or Monday.  Follow up in 2 weeks.

## 2013-01-25 ENCOUNTER — Other Ambulatory Visit (INDEPENDENT_AMBULATORY_CARE_PROVIDER_SITE_OTHER): Payer: Self-pay

## 2013-01-25 DIAGNOSIS — Z3482 Encounter for supervision of other normal pregnancy, second trimester: Secondary | ICD-10-CM

## 2013-01-25 DIAGNOSIS — Z3480 Encounter for supervision of other normal pregnancy, unspecified trimester: Secondary | ICD-10-CM

## 2013-01-25 LAB — CBC
HCT: 33.3 % — ABNORMAL LOW (ref 36.0–46.0)
MCH: 32.9 pg (ref 26.0–34.0)
MCHC: 34.2 g/dL (ref 30.0–36.0)
MCV: 96 fL (ref 78.0–100.0)
RDW: 14 % (ref 11.5–15.5)

## 2013-01-25 LAB — GLUCOSE, CAPILLARY: Glucose-Capillary: 110 mg/dL — ABNORMAL HIGH (ref 70–99)

## 2013-01-25 LAB — HIV ANTIBODY (ROUTINE TESTING W REFLEX): HIV: NONREACTIVE

## 2013-01-25 NOTE — Progress Notes (Signed)
S: doing well O: see flowsheet A/P: 32 yo G1P0 at 71.5 wga who presents for routine follow up - patient to return for lab visit for 1hr glucola, HIV, RPR and CBC - follow up in 2 weeks.

## 2013-01-25 NOTE — Progress Notes (Signed)
CBC, HIV, RPR, 1 HOUR GTT DONE TODAY.  MUHAMMAD KHUWAJA.

## 2013-01-26 LAB — RPR

## 2013-02-03 NOTE — Telephone Encounter (Signed)
Called pt to inform labs results per her OB  doctor's request. Pt's phone have been disconnected; I called her sister asking to contact pt to let her know she needs to come to our clinic in order to get labs done on Monday 24th. This call was done in Spanish pt's  sister stated that she will go to pt's house to inform above. Labs have been ordered. Signed: D. [place] Rolene Arbour, MD Family Medicine  PGY-3

## 2013-02-05 ENCOUNTER — Other Ambulatory Visit (INDEPENDENT_AMBULATORY_CARE_PROVIDER_SITE_OTHER): Payer: Self-pay

## 2013-02-05 DIAGNOSIS — D696 Thrombocytopenia, unspecified: Secondary | ICD-10-CM

## 2013-02-05 LAB — POCT URINALYSIS DIPSTICK
Bilirubin, UA: NEGATIVE
Blood, UA: NEGATIVE
Ketones, UA: NEGATIVE
Spec Grav, UA: >=1.03
Urobilinogen, UA: 0.2
pH, UA: 5.5

## 2013-02-05 LAB — COMPREHENSIVE METABOLIC PANEL
ALT: 10 U/L (ref 0–35)
AST: 21 U/L (ref 0–37)
Albumin: 3.1 g/dL — ABNORMAL LOW (ref 3.5–5.2)
Alkaline Phosphatase: 115 U/L (ref 39–117)
Creat: 0.43 mg/dL — ABNORMAL LOW (ref 0.50–1.10)
Glucose, Bld: 86 mg/dL (ref 70–99)
Total Bilirubin: 0.3 mg/dL (ref 0.3–1.2)

## 2013-02-05 LAB — CBC
Hemoglobin: 11.4 g/dL — ABNORMAL LOW (ref 12.0–15.0)
MCH: 33.3 pg (ref 26.0–34.0)
MCHC: 35 g/dL (ref 30.0–36.0)
MCV: 95.3 fL (ref 78.0–100.0)
Platelets: 127 10*3/uL — ABNORMAL LOW (ref 150–400)

## 2013-02-05 LAB — POCT UA - MICROSCOPIC ONLY

## 2013-02-05 NOTE — Progress Notes (Signed)
CBC,CMP,UA AND PROTEIN/CREAT RATIO DONE TODAY Jaloni Sorber

## 2013-02-06 LAB — PROTEIN / CREATININE RATIO, URINE: Protein Creatinine Ratio: 0.09 (ref <0.15)

## 2013-02-12 ENCOUNTER — Ambulatory Visit (INDEPENDENT_AMBULATORY_CARE_PROVIDER_SITE_OTHER): Payer: Self-pay | Admitting: Family Medicine

## 2013-02-12 VITALS — BP 99/64 | Wt 110.0 lb

## 2013-02-12 DIAGNOSIS — K219 Gastro-esophageal reflux disease without esophagitis: Secondary | ICD-10-CM

## 2013-02-12 MED ORDER — DOXYLAMINE SUCCINATE 5 MG PO CHEW: 2.0000 | CHEWABLE_TABLET | Freq: Every day | ORAL | Status: DC

## 2013-02-12 MED ORDER — RANITIDINE HCL 150 MG PO TABS: 150.0000 mg | ORAL_TABLET | Freq: Two times a day (BID) | ORAL | Status: DC

## 2013-02-12 MED ORDER — VITAMIN B-6 25 MG PO TABS: 25.0000 mg | ORAL_TABLET | Freq: Three times a day (TID) | ORAL | Status: DC | PRN

## 2013-02-12 NOTE — Patient Instructions (Signed)
Please follow up with the OB clinic in 2 weeks and then with me in 4 weeks.   Tercer trimestre del Psychiatrist  (Third Trimester of Pregnancy) El tercer trimestre del embarazo abarca desde la semana 29 hasta la semana 42, desde el 7 mes hasta el 9. En este trimestre el feto se desarrolla muy rpidamente. Hacia el final del noveno mes, el beb que an no ha nacido mide alrededor de 20 pulgadas (45 cm) de largo y pesa entre 6 y 10 libras (2,700 y 4,500 kg).  CAMBIOS CORPORALES  Su organismo atravesar numerosos cambios durante el [place]. Los cambios varan de una mujer a Educational psychologist.   Seguir [employer]. Es esperable que aumente entre 25 y 35 libras (11 16 kg) hacia el final del [place].  Podrn aparecer las primeras estras en las caderas, abdomen y [place].  Tendr necesidad de Geographical information systems officer con ms frecuencia porque el feto baja hacia la pelvis y presiona en la vejiga.  Como consecuencia del Psychiatrist, podr sentir Actor.  Podr estar constipada ya que ciertas hormonas hacen que los msculos que hacen progresar los desechos a travs de los intestinos trabajen ms lentamente.  Pueden aparecer hemorroides o abultarse e hincharse las venas (venas varicosas).  Podr sentir dolor plvico debido al [employer] de peso ya que las hormonas del embarazo relajan las articulaciones entre los huesos de la pelvis. El dolor de espalda puede ser consecuencia de la exigencia de los msculos que soportan la [place].  Sus mamas seguirn desarrollndose y estarn ms sensibles. A veces sale Veterinary surgeon de las [place], que se llama Product manager.  El ombligo puede salir hacia afuera.  Podr sentir [employer] falta el aire debido a que se expande el tero.  Podr notar que el feto "baja" o que se siente ms bajo en el abdomen.  Podr tener una prdida de secrecin mucosa con sangre. Esto suele ocurrir [employer] unos [address] y Neomia Dear semana antes del [place].  El cuello se vuelve delgado y blando  (se borra) cerca de la fecha de [place]. QU DEBE ESPERAR EN LAS CONSULTAS PRENATALES  Le harn exmenes prenatales cada 2 semanas hasta la semana 36. A partir de ese momento le harn exmenes semanales. Durante una visita prenatal de rutina:   La pesarn para verificar que usted y el feto se encuentran dentro de los lmites normales.  Le tomarn la presin arterial.  Le medirn el abdomen para verificar el desarrollo del beb.  Escucharn los latidos fetales.  Se evaluarn los resultados de los estudios solicitados en visitas anteriores.  Le controlarn el cuello del tero cuando est prxima la fecha de parto para ver si se ha borrado. Alrededor de la semana 36 el mdico controlar el cuello del tero. Al mismo tiempo realizar un anlisis de las secreciones del tejido vaginal. Este examen es para determinar si hay un tipo de bacteria, estreptococo Grupo B. El mdico le explicar esto con ms detalle.  El mdico podr preguntarle:   DIRECTV gustara que fuera el [place].  Cmo se siente.  Si siente los movimientos del beb.  Si tiene sntomas anormales, como prdida de lquido, [place], dolores de cabeza intenso o clicos abdominales.  Si tiene [place] duda. Otros estudios que podrn realizarse durante el tercer trimestre son:   Anlisis de sangre para controlar sus niveles de hierro (anemia).  Controles fetales para determinar su salud, el nivel de [place] y su desarrollo. Si tiene [place] enfermedad o si tuvo problemas durante el  embarazo, Forensic scientist. FALSO TRABAJO DE PARTO  Es posible que sienta contracciones pequeas e irregulares que finalmente desaparecen. Se llaman contracciones de [address] o falso trabajo de [place]. Las contracciones pueden durar horas, [address]  [address] o an Retail buyer antes de que el verdadero trabajo de parto se inicie. Si las contracciones tienen intervalos regulares, se intensifican o se hacen dolorosas, lo mejor es que la revise su mdico.  SIGNOS DE  TRABAJO DE PARTO   Espasmos del tipo menstrual.  Contracciones cada 5 minutos o menos.  Contracciones que comienzan en la parte superior del tero y se expanden hacia abajo, a la zona inferior del abdomen y la espalda.  Sensacin de presin que aumenta en la pelvis o dolor en la espalda.  Aparece una secrecin acuosa o sanguinolenta por la vagina. Si tiene alguno de estos signos antes de la semana 37 del embarazo, llame a su mdico inmediatamente. Debe concurrir al hospital para ser controlada inmediatamente.  INSTRUCCIONES PARA EL CUIDADO EN EL HOGAR   Evite fumar, consumir hierbas, beber alcohol y Chemical engineer frmacos que no le hayan recetado. Estas sustancias qumicas afectan la formacin y el desarrollo del beb.  Siga las indicaciones del profesional con respecto a Adult nurse. Durante el embarazo, hay medicamentos que son seguros y otros no lo son.  Realice actividad fsica slo segn las indicaciones del mdico. Sentir clicos uterinos es el mejor signo para Restaurant manager, fast food actividad fsica.  Contine haciendo comidas regulares y sanas.  Use un sostn que le brinde buen soporte si sus mamas estn sensibles.  No utilice la baera con agua caliente, baos turcos o saunas.  Colquese el cinturn de seguridad cuando conduzca.  Evite comer carne cruda queso sin cocinar y el contacto con los utensilios y desperdicios de los gatos. Estos elementos contienen grmenes que pueden causar defectos de nacimiento en el beb.  Tome las vitaminas indicadas para la etapa prenatal.  Pruebe un laxante (si el mdico la autoriza) si tiene constipacin. Consuma ms alimentos ricos en fibra, como vegetales y frutas frescos y cereales enteros. Beba gran cantidad de lquido para mantener la orina de tono claro o amarillo plido.  Tome baos de agua tibia para Primary school teacher o las molestias causadas por las hemorroides. Use una crema para las hemorroides si el mdico la autoriza.  Si tiene  venas varicosas, use medias de soporte. Eleve los pies durante 15 minutos, 3 4 veces por da. Limite el consumo de sal en su dieta.  Evite levantar objetos pesados, use zapatos de tacones bajos y [place].  Descanse con las piernas elevadas si tiene calambres o dolor de cintura.  Visite a su dentista si no lo ha Occupational hygienist. Use un cepillo de dientes blando para higienizarse los dientes y use suavemente el hilo dental.  Puede continuar su vida sexual excepto que el mdico le indique otra cosa.  No haga viajes largos excepto que sea absolutamente necesario y slo con la aprobacin de su mdico.  Tome clases prenatales para entender, Education administrator y hacer preguntas sobre el [place] de parto y el alumbramiento.  Haga un ensayo sobre la partida al hospital.  Prepare el bolso que llevar al hospital.  Prepare la habitacin del beb.  Contine concurriendo a todas las visitas prenatales segn las indicaciones de su mdico. SOLICITE ATENCIN MDICA SI:   No est segura si est en trabajo de parto o ha roto la bolsa de aguas.  Tiene mareos.  Siente clicos leves,  presin en la pelvis o dolor persistente en el abdomen.  Tiene nuseas o vmitos persistentes.  Brett Fairy secrecin vaginal con mal olor.  Siente dolor al [employer]. SOLICITE ATENCIN MDICA DE INMEDIATO SI:   Tiene fiebre.  Pierde lquido o sangre por la vagina.  Tiene sangrado o pequeas prdidas vaginales.  Siente dolor intenso o clicos en el abdomen.  Sube o baja de peso rpidamente.  Le falta el aire y le duele el pecho al respirar.  Sbitamente se le hincha el rostro, las manos, los tobillos, los pies o las piernas de [place].  No ha sentido los movimientos del beb durante Georgianne Fick.  Siente un dolor de cabeza intenso que no se alivia con medicamentos.  Su visin se modifica. Document Released: 12/09/2004 Document Revised: 11/01/2012 [facility] Patient Information 2014  [place], [place].

## 2013-02-12 NOTE — Progress Notes (Signed)
S: presents for routine follow up. Having some heart burn and some low back pain. Taking ranitidine for heartburn which helps. She also has some associated nausea and some vomiting of phlegm. Back pain is better with rest, worst with activity, not every day, not debilitating. No weakness in lower extremities.  She was found to have low platelets on routine cbc. She denies any new bruising or gum bleeding. She states that she bruises easily but that is not new from this pregnancy. No nose bleeds. No vaginal bleeding.  No vaginal discharge, no itching, no burning. Feeling fetal movement. No contractions.  O: see flowsheet A/P:  32 yo G1P0 at 30.4wga who presents for routine prenatal visit.  - thrombocytopenia: CMP and urine normal without evidence of HELLP. Normal BP as well. No evidence of active bleeding. She has not had trhombocytopenia in the past. She is in the third trimester which is consistent with thrombocytopenia of pregnancy. Will check CBC every 4 weeks. Reviewed red flags for return to care: gum bleeding, nose bleed, worsening bruising, vaginal bleeding (warrants going to the MAU).  - 28wga labs done and reviewed - received TDap - heart burn: refilled ranitidine - nausea: refilled B6 and doxylamine.  - back pain: tylenol - US done at Dr. Elsie Stain. Request for records made - phq9 done and score: 1. For appetite. Medical home form: normal - follow up in 2 weeks with Ob clinic, follow up in 4 weeks with me

## 2013-02-15 ENCOUNTER — Encounter: Payer: Self-pay | Admitting: Family Medicine

## 2013-03-01 ENCOUNTER — Ambulatory Visit (INDEPENDENT_AMBULATORY_CARE_PROVIDER_SITE_OTHER): Payer: Self-pay | Admitting: Family Medicine

## 2013-03-01 VITALS — BP 102/70 | Wt 114.0 lb

## 2013-03-01 DIAGNOSIS — Z3493 Encounter for supervision of normal pregnancy, unspecified, third trimester: Secondary | ICD-10-CM

## 2013-03-01 DIAGNOSIS — Z3480 Encounter for supervision of other normal pregnancy, unspecified trimester: Secondary | ICD-10-CM

## 2013-03-01 NOTE — Patient Instructions (Addendum)
Fue genial ver a ustedes hoy.  Tanto usted como su beb estn bien Utilice las frulas de [place] cada noche, y durante el da si quieres Use Tylenol para su dolor en la [place], y vamos a hablar de esto de nuevo en su prxima visita  Por favor traiga todos sus medicamentos para visitar cada mdicos Inscrbete Mi carta para tener fcil acceso a sus resultados laboratorios, y la comunicacin con su mdico de atencin primaria.  prxima cita Con PCP en 2 semanas  Espero poder hablar con usted de nuevo en nuestra prxima visita. Si tiene cualquier pregunta o preocupacin antes de esa fecha, por favor llame a la clnica al [phone].  [place],  Dr. Wenda Low

## 2013-03-01 NOTE — Progress Notes (Signed)
Hannah Orozco is a 32 y.o. G1P0 at [redacted]w[redacted]d for routine follow up.  She reports left wrist pain today. Pain is worst in the morning and associated with numbness. She has been using a wrist splint from [employer] for the past few days (daytime only) with some improvement. Pain also associated with morning stiffness in MCP and PIP joints greatest in 2nd and 3rd digits. Positive Phalen and Tinel signs. DDx Carpal tunnel syndrome vs arthritis (mother has arthritis; She doesn't have current signs of inflammation)  - See flow sheet for details.  A/P: Pregnancy at [redacted]w[redacted]d.  Doing well.   Pregnancy issues include wrist pain:   Infant feeding choice: Hopes to breast feed Contraception choice: Interested in [place], but considering options Infant circumcision desired : Unsure, considering  Tdap already received   Plan Preterm labor precautions reviewed. Follow up 2 weeks. - Wrist Pain: Most likely carpal tunnel; Advised wearing splint at night and using Tylenol for pain; Reassess at next visit  OB Attending Note: Kehinde Eniola,MD I  have seen and examined this patient, reviewed their chart. I have discussed this patient with the resident. I agree with the resident's findings, assessment and care plan. OB U/S done Sept 19th reviewed.

## 2013-03-03 ENCOUNTER — Inpatient Hospital Stay (HOSPITAL_COMMUNITY)
Admission: AD | Admit: 2013-03-03 | Discharge: 2013-03-03 | Disposition: A | Discharge status: Home / Self Care | Payer: Self-pay | Source: Ambulatory Visit | Attending: Obstetrics and Gynecology | Admitting: Obstetrics and Gynecology

## 2013-03-03 ENCOUNTER — Encounter (HOSPITAL_COMMUNITY): Payer: Self-pay | Admitting: *Deleted

## 2013-03-03 DIAGNOSIS — R509 Fever, unspecified: Secondary | ICD-10-CM | POA: Insufficient documentation

## 2013-03-03 DIAGNOSIS — R51 Headache: Secondary | ICD-10-CM | POA: Insufficient documentation

## 2013-03-03 DIAGNOSIS — O99891 Other specified diseases and conditions complicating pregnancy: Secondary | ICD-10-CM | POA: Insufficient documentation

## 2013-03-03 DIAGNOSIS — J069 Acute upper respiratory infection, unspecified: Secondary | ICD-10-CM

## 2013-03-03 LAB — URINALYSIS, ROUTINE W REFLEX MICROSCOPIC
Hgb urine dipstick: NEGATIVE
Nitrite: NEGATIVE
Specific Gravity, Urine: 1.02 (ref 1.005–1.030)
pH: 5.5 (ref 5.0–8.0)

## 2013-03-03 LAB — URINE MICROSCOPIC-ADD ON

## 2013-03-03 MED ORDER — PSEUDOEPHEDRINE HCL 30 MG PO TABS
60.0000 mg | ORAL_TABLET | Freq: Once | ORAL | Status: AC
  Administered 2013-03-03: 60 mg via ORAL
  Filled 2013-03-03: qty 2

## 2013-03-03 NOTE — MAU Note (Signed)
Pt present with complaints of fever, chills and body aches since yesterday. States her fever has been as high as 99. but she has been taking tylenol with the last tylenol being 2 hours ago

## 2013-03-03 NOTE — MAU Provider Note (Signed)
  History     CSN: [id number]  Arrival date and time: 03/03/13 4098   First Provider Initiated Contact with Patient 03/03/13 [phone]      Chief Complaint  Patient presents with  . Fever  . Chills  . Generalized Body Aches   Fever     Hannah Orozco is a 32 y.o. G1P0 at [redacted]w[redacted]d who presents today with fever, headache and general malaise. She states that her highest temp at home has been 99.0, and she has not had any temps >100.4. She has been taking tylenol for her discomforts. Pt not feeling contractions, denies pain in her abdomen.   History reviewed. No pertinent past medical history.  Past Surgical History  Procedure Laterality Date  . Esophagus surgery      laparoscopic    History reviewed. No pertinent family history.  History  Substance Use Topics  . Smoking status: Never Smoker   . Smokeless tobacco: Not on file  . Alcohol Use: No    Allergies: No Known Allergies  Prescriptions prior to admission  Medication Sig Dispense Refill  . Doxylamine Succinate 5 MG CHEW Chew 2 tablets (10 mg total) by mouth daily.  112 each  2  . Prenatal Multivit-Min-Fe-FA (PRENATAL FORTE) TABS Take 1 tablet by mouth daily.  30 each  10  . ranitidine (ZANTAC) 150 MG tablet Take 1 tablet (150 mg total) by mouth 2 (two) times daily.  60 tablet  2  . vitamin B-6 (PYRIDOXINE) 25 MG tablet Take 1 tablet (25 mg total) by mouth 3 (three) times daily as needed.  60 tablet  2    Review of Systems  Constitutional: Positive for fever.   Physical Exam   Blood pressure 106/71, pulse 106, temperature 98.4 F (36.9 C), temperature source Oral, resp. rate 16, last menstrual period 07/13/2012.  Physical Exam  Nursing note and vitals reviewed. Constitutional: She is oriented to person, place, and time. She appears well-developed and well-nourished.  Afebrile   Cardiovascular: Normal rate.   Respiratory: Effort normal.  GI: Soft. There is no tenderness.  Neurological: She is alert and  oriented to person, place, and time.  Skin: Skin is warm and dry.  Psychiatric: She has a normal mood and affect.   FHT: 145, moderate  Toco: some UI  MAU Course  Procedures  0800: Care turned over to J. Rasch, NP  Dilation: Closed Effacement (%): Thick Exam by:: J Rasch NP  Assessment and Plan   1. Viral URI     -comfort measures reviewed -increase PO hydration, tylenol as needed, sudafed as needed, mucinex as needed  -Return to MAU if sx worsen or do not improve -Return to MAU if fever is greater than 100.5 -Kick counts   Follow-up Information   [facility] (as scheduled )    Contact information:   [address] [place] [address]        Tawnya Crook 03/03/2013, 7:13 AM   Iona Hansen Rasch, NP 03/03/2013 8:50 AM

## 2013-03-04 LAB — URINE CULTURE: Colony Count: NO GROWTH

## 2013-03-04 NOTE — MAU Provider Note (Signed)
Attestation of Attending Supervision of Advanced Practitioner: Evaluation and management procedures were performed by the PA/NP/CNM/OB Fellow under my supervision/collaboration. Chart reviewed and agree with management and plan.  Tilda Burrow 03/04/2013 7:57 PM

## 2013-03-15 NOTE — L&D Delivery Note (Signed)
Attestation of Attending Supervision of Advanced Practitioner (CNM/NP): Evaluation and management procedures were performed by the Advanced Practitioner under my supervision and collaboration.  I have reviewed the Advanced Practitioner's note and chart, and I agree with the management and plan.  HARRAWAY-SMITH, Shane Melby 5:49 PM

## 2013-03-15 NOTE — L&D Delivery Note (Signed)
Delivery Note At 11:03 PM a viable female was delivered via Vaginal, Spontaneous Delivery (Presentation: ROA;  ).  APGAR:8 , 9; weight: pending Placenta status: intact. Cord: no cord complications.  following complications: tight shoulders that responded to grasping of the posterior axilla.  Cord pH: NA  Anesthesia: Epidural  Lacerations: none Suture Repair: none Est. Blood Loss (mL): 100  Mom to AICU.  Baby to Couplet care / Skin to Skin.  Marena ChancyLOSQ, STEPHANIE 04/12/2013, 11:32 PM   I was here for delivery and agree with above.  CRESENZO-DISHMAN,Allianna Beaubien

## 2013-03-19 ENCOUNTER — Ambulatory Visit (INDEPENDENT_AMBULATORY_CARE_PROVIDER_SITE_OTHER): Payer: Medicaid Other | Admitting: Family Medicine

## 2013-03-19 VITALS — BP 96/61 | Wt 116.0 lb

## 2013-03-19 DIAGNOSIS — Z3400 Encounter for supervision of normal first pregnancy, unspecified trimester: Secondary | ICD-10-CM

## 2013-03-19 DIAGNOSIS — Z3402 Encounter for supervision of normal first pregnancy, second trimester: Secondary | ICD-10-CM

## 2013-03-19 LAB — CBC
HCT: 34.2 % — ABNORMAL LOW (ref 36.0–46.0)
Hemoglobin: 11.7 g/dL — ABNORMAL LOW (ref 12.0–15.0)
MCH: 31.7 pg (ref 26.0–34.0)
MCHC: 34.2 g/dL (ref 30.0–36.0)
MCV: 92.7 fL (ref 78.0–100.0)
Platelets: 108 10*3/uL — ABNORMAL LOW (ref 150–400)
RBC: 3.69 MIL/uL — AB (ref 3.87–5.11)
RDW: 13.5 % (ref 11.5–15.5)
WBC: 7.7 10*3/uL (ref 4.0–10.5)

## 2013-03-19 NOTE — Progress Notes (Signed)
S: cough and cold symptoms for 2 weeks. Went to MAU for evaluation 2 weeks ago after having fever. Symptoms have improved except that cough continues to linger. She has been taking tylenol but has not been taken anything else for fear that it isn't safe in pregnancy. Otherwise, no recent fever. No shortness of breath.  Denies vaginal bleeding. Has some white discharge unchanged from previous. No loss of fluid, no vaginal itching or burning. No contractions. Normal fetal movement O: see flowsheet A/P:  33 yo G1P0 at 35.4wga who presents for routine follow up.  - thrombocytopenia: likely from gestational thrombocytopenia. Labwork done did not show any evidence of associated HELLP. CBC today for monitoring. No active bleeding - cold symptoms: recommended robitussin for cough and congestion. Reviewed that cough can linger up to 4 weeks.  - plans on breastfeeding - weight gain: has not quite gained the expected 1lb/week. Recommended that she focus on eating things she like, as her appetite is not always especially good.  - follow up with OB clinic in 1 week.

## 2013-03-19 NOTE — Patient Instructions (Signed)
Please follow up with OB clinic in 1 week and with me in 2 weeks.   Tercer trimestre del Psychiatrist  (Third Trimester of Pregnancy) El tercer trimestre del embarazo abarca desde la semana 29 hasta la semana 42, desde el 7 mes hasta el 9. En este trimestre el feto se desarrolla muy rpidamente. Hacia el final del noveno mes, el beb que an no ha nacido mide alrededor de 20 pulgadas (45 cm) de largo y pesa entre 6 y 10 libras (2,700 y 4,500 kg).  CAMBIOS CORPORALES  Su organismo atravesar numerosos cambios durante el [place]. Los cambios varan de una mujer a Educational psychologist.   Seguir [employer]. Es esperable que aumente entre 25 y 35 libras (11 16 kg) hacia el final del [place].  Podrn aparecer las primeras estras en las caderas, abdomen y [place].  Tendr necesidad de Geographical information systems officer con ms frecuencia porque el feto baja hacia la pelvis y presiona en la vejiga.  Como consecuencia del Psychiatrist, podr sentir Actor.  Podr estar constipada ya que ciertas hormonas hacen que los msculos que hacen progresar los desechos a travs de los intestinos trabajen ms lentamente.  Pueden aparecer hemorroides o abultarse e hincharse las venas (venas varicosas).  Podr sentir dolor plvico debido al [employer] de peso ya que las hormonas del embarazo relajan las articulaciones entre los huesos de la pelvis. El dolor de espalda puede ser consecuencia de la exigencia de los msculos que soportan la [place].  Sus mamas seguirn desarrollndose y estarn ms sensibles. A veces sale Veterinary surgeon de las [place], que se llama Product manager.  El ombligo puede salir hacia afuera.  Podr sentir [employer] falta el aire debido a que se expande el tero.  Podr notar que el feto "baja" o que se siente ms bajo en el abdomen.  Podr tener una prdida de secrecin mucosa con sangre. Esto suele ocurrir [employer] unos [address] y Neomia Dear semana antes del [place].  El cuello se vuelve delgado y blando (se  borra) cerca de la fecha de [place]. QU DEBE ESPERAR EN LAS CONSULTAS PRENATALES  Le harn exmenes prenatales cada 2 semanas hasta la semana 36. A partir de ese momento le harn exmenes semanales. Durante una visita prenatal de rutina:   La pesarn para verificar que usted y el feto se encuentran dentro de los lmites normales.  Le tomarn la presin arterial.  Le medirn el abdomen para verificar el desarrollo del beb.  Escucharn los latidos fetales.  Se evaluarn los resultados de los estudios solicitados en visitas anteriores.  Le controlarn el cuello del tero cuando est prxima la fecha de parto para ver si se ha borrado. Alrededor de la semana 36 el mdico controlar el cuello del tero. Al mismo tiempo realizar un anlisis de las secreciones del tejido vaginal. Este examen es para determinar si hay un tipo de bacteria, estreptococo Grupo B. El mdico le explicar esto con ms detalle.  El mdico podr preguntarle:   DIRECTV gustara que fuera el [place].  Cmo se siente.  Si siente los movimientos del beb.  Si tiene sntomas anormales, como prdida de lquido, [place], dolores de cabeza intenso o clicos abdominales.  Si tiene [place] duda. Otros estudios que podrn realizarse durante el tercer trimestre son:   Anlisis de sangre para controlar sus niveles de hierro (anemia).  Controles fetales para determinar su salud, el nivel de [place] y su desarrollo. Si tiene [place] enfermedad o si tuvo problemas durante el [place], [place]  harn estudios. FALSO TRABAJO DE PARTO  Es posible que sienta contracciones pequeas e irregulares que finalmente desaparecen. Se llaman contracciones de [address] o falso trabajo de [place]. Las contracciones pueden durar horas, [address]  [address] o an Retail buyer antes de que el verdadero trabajo de parto se inicie. Si las contracciones tienen intervalos regulares, se intensifican o se hacen dolorosas, lo mejor es que la revise su mdico.  SIGNOS DE TRABAJO DE  PARTO   Espasmos del tipo menstrual.  Contracciones cada 5 minutos o menos.  Contracciones que comienzan en la parte superior del tero y se expanden hacia abajo, a la zona inferior del abdomen y la espalda.  Sensacin de presin que aumenta en la pelvis o dolor en la espalda.  Aparece una secrecin acuosa o sanguinolenta por la vagina. Si tiene alguno de estos signos antes de la semana 37 del embarazo, llame a su mdico inmediatamente. Debe concurrir al hospital para ser controlada inmediatamente.  INSTRUCCIONES PARA EL CUIDADO EN EL HOGAR   Evite fumar, consumir hierbas, beber alcohol y Chemical engineer frmacos que no le hayan recetado. Estas sustancias qumicas afectan la formacin y el desarrollo del beb.  Siga las indicaciones del profesional con respecto a Adult nurse. Durante el embarazo, hay medicamentos que son seguros y otros no lo son.  Realice actividad fsica slo segn las indicaciones del mdico. Sentir clicos uterinos es el mejor signo para Restaurant manager, fast food actividad fsica.  Contine haciendo comidas regulares y sanas.  Use un sostn que le brinde buen soporte si sus mamas estn sensibles.  No utilice la baera con agua caliente, baos turcos o saunas.  Colquese el cinturn de seguridad cuando conduzca.  Evite comer carne cruda queso sin cocinar y el contacto con los utensilios y desperdicios de los gatos. Estos elementos contienen grmenes que pueden causar defectos de nacimiento en el beb.  Tome las vitaminas indicadas para la etapa prenatal.  Pruebe un laxante (si el mdico la autoriza) si tiene constipacin. Consuma ms alimentos ricos en fibra, como vegetales y frutas frescos y cereales enteros. Beba gran cantidad de lquido para mantener la orina de tono claro o amarillo plido.  Tome baos de agua tibia para Primary school teacher o las molestias causadas por las hemorroides. Use una crema para las hemorroides si el mdico la autoriza.  Si tiene venas  varicosas, use medias de soporte. Eleve los pies durante 15 minutos, 3 4 veces por da. Limite el consumo de sal en su dieta.  Evite levantar objetos pesados, use zapatos de tacones bajos y [place].  Descanse con las piernas elevadas si tiene calambres o dolor de cintura.  Visite a su dentista si no lo ha Occupational hygienist. Use un cepillo de dientes blando para higienizarse los dientes y use suavemente el hilo dental.  Puede continuar su vida sexual excepto que el mdico le indique otra cosa.  No haga viajes largos excepto que sea absolutamente necesario y slo con la aprobacin de su mdico.  Tome clases prenatales para entender, Education administrator y hacer preguntas sobre el [place] de parto y el alumbramiento.  Haga un ensayo sobre la partida al hospital.  Prepare el bolso que llevar al hospital.  Prepare la habitacin del beb.  Contine concurriendo a todas las visitas prenatales segn las indicaciones de su mdico. SOLICITE ATENCIN MDICA SI:   No est segura si est en trabajo de parto o ha roto la bolsa de aguas.  Tiene mareos.  Siente clicos leves, presin en  la pelvis o dolor persistente en el abdomen.  Tiene nuseas o vmitos persistentes.  Brett Fairybserva una secrecin vaginal con mal olor.  Siente dolor al ConocoPhillipsorinar. SOLICITE ATENCIN MDICA DE INMEDIATO SI:   Tiene fiebre.  Pierde lquido o sangre por la vagina.  Tiene sangrado o pequeas prdidas vaginales.  Siente dolor intenso o clicos en el abdomen.  Sube o baja de peso rpidamente.  Le falta el aire y le duele el pecho al respirar.  Sbitamente se le hincha el rostro, las manos, los tobillos, los pies o las piernas de [place]era extrema.  No ha sentido los movimientos del beb durante Georgianne Fickuna hora.  Siente un dolor de cabeza intenso que no se alivia con medicamentos.  Su visin se modifica. Document Released: 12/09/2004 Document Revised: 11/01/2012 [facility] Patient Information 2014  [place]Care, [place].

## 2013-03-30 ENCOUNTER — Ambulatory Visit (INDEPENDENT_AMBULATORY_CARE_PROVIDER_SITE_OTHER): Payer: Medicaid Other | Admitting: Family Medicine

## 2013-03-30 VITALS — BP 104/58 | Wt 117.0 lb

## 2013-03-30 DIAGNOSIS — Z3400 Encounter for supervision of normal first pregnancy, unspecified trimester: Secondary | ICD-10-CM

## 2013-03-30 NOTE — Patient Instructions (Signed)
Hannah Orozco:  Pager: [phone] Cell phone: [phone]   Tercer trimestre del Psychiatristembarazo  (Third Trimester of Pregnancy) El tercer trimestre del embarazo abarca desde la semana 29 hasta la semana 42, desde el 7 mes hasta el 9. En este trimestre el feto se desarrolla muy rpidamente. Hacia el final del noveno mes, el beb que an no ha nacido mide alrededor de 20 pulgadas (45 cm) de largo y pesa entre 6 y 10 libras (2,700 y 4,500 kg).  CAMBIOS CORPORALES  Su organismo atravesar numerosos cambios durante el [place]. Los cambios varan de una mujer a Educational psychologistotra.   Seguir American Standard Companiesaumentando de peso. Es esperable que aumente entre 25 y 35 libras (11 16 kg) hacia el final del [place].  Podrn aparecer las primeras estras en las caderas, abdomen y [place].  Tendr necesidad de Geographical information systems officerorinar con ms frecuencia porque el feto baja hacia la pelvis y presiona en la vejiga.  Como consecuencia del Psychiatristembarazo, podr sentir Actoracidez estomacal continuamente.  Podr estar constipada ya que ciertas hormonas hacen que los msculos que hacen progresar los desechos a travs de los intestinos trabajen ms lentamente.  Pueden aparecer hemorroides o abultarse e hincharse las venas (venas varicosas).  Podr sentir dolor plvico debido al Citigroupaumento de peso ya que las hormonas del embarazo relajan las articulaciones entre los huesos de la pelvis. El dolor de espalda puede ser consecuencia de la exigencia de los msculos que soportan la [place]postura.  Sus mamas seguirn desarrollndose y estarn ms sensibles. A veces sale Veterinary surgeonuna secrecin amarilla de las [place], que se llama Product managercalostro.  El ombligo puede salir hacia afuera.  Podr sentir Wal-Martque le falta el aire debido a que se expande el tero.  Podr notar que el feto "baja" o que se siente ms bajo en el abdomen.  Podr tener una prdida de secrecin mucosa con sangre. Esto suele ocurrir General Electricentre unos [address]os [address] y Neomia Dearuna semana antes del [place].  El cuello se vuelve delgado y blando (se borra)  cerca de la fecha de [place]. QU DEBE ESPERAR EN LAS CONSULTAS PRENATALES  Le harn exmenes prenatales cada 2 semanas hasta la semana 36. A partir de ese momento le harn exmenes semanales. Durante una visita prenatal de rutina:   La pesarn para verificar que usted y el feto se encuentran dentro de los lmites normales.  Le tomarn la presin arterial.  Le medirn el abdomen para verificar el desarrollo del beb.  Escucharn los latidos fetales.  Se evaluarn los resultados de los estudios solicitados en visitas anteriores.  Le controlarn el cuello del tero cuando est prxima la fecha de parto para ver si se ha borrado. Alrededor de la semana 36 el mdico controlar el cuello del tero. Al mismo tiempo realizar un anlisis de las secreciones del tejido vaginal. Este examen es para determinar si hay un tipo de bacteria, estreptococo Grupo B. El mdico le explicar esto con ms detalle.  El mdico podr preguntarle:   DIRECTVComo le gustara que fuera el [place].  Cmo se siente.  Si siente los movimientos del beb.  Si tiene sntomas anormales, como prdida de lquido, [place] Parksangrado, dolores de cabeza intenso o clicos abdominales.  Si tiene [place]una duda. Otros estudios que podrn realizarse durante el tercer trimestre son:   Anlisis de sangre para controlar sus niveles de hierro (anemia).  Controles fetales para determinar su salud, el nivel de [place] the Grenadinesactividad y su desarrollo. Si tiene [place]alguna enfermedad o si tuvo problemas durante el [place]embarazo, le harn estudios. FALSO  TRABAJO DE PARTO  Es posible que sienta contracciones pequeas e irregulares que finalmente desaparecen. Se llaman contracciones de [address] o falso trabajo de [place]. Las contracciones pueden durar horas, [address]  [address] o an Retail buyer antes de que el verdadero trabajo de parto se inicie. Si las contracciones tienen intervalos regulares, se intensifican o se hacen dolorosas, lo mejor es que la revise su mdico.  SIGNOS DE TRABAJO DE PARTO    Espasmos del tipo menstrual.  Contracciones cada 5 minutos o menos.  Contracciones que comienzan en la parte superior del tero y se expanden hacia abajo, a la zona inferior del abdomen y la espalda.  Sensacin de presin que aumenta en la pelvis o dolor en la espalda.  Aparece una secrecin acuosa o sanguinolenta por la vagina. Si tiene alguno de estos signos antes de la semana 37 del embarazo, llame a su mdico inmediatamente. Debe concurrir al hospital para ser controlada inmediatamente.  INSTRUCCIONES PARA EL CUIDADO EN EL HOGAR   Evite fumar, consumir hierbas, beber alcohol y Chemical engineer frmacos que no le hayan recetado. Estas sustancias qumicas afectan la formacin y el desarrollo del beb.  Siga las indicaciones del profesional con respecto a Adult nurse. Durante el embarazo, hay medicamentos que son seguros y otros no lo son.  Realice actividad fsica slo segn las indicaciones del mdico. Sentir clicos uterinos es el mejor signo para Restaurant manager, fast food actividad fsica.  Contine haciendo comidas regulares y sanas.  Use un sostn que le brinde buen soporte si sus mamas estn sensibles.  No utilice la baera con agua caliente, baos turcos o saunas.  Colquese el cinturn de seguridad cuando conduzca.  Evite comer carne cruda queso sin cocinar y el contacto con los utensilios y desperdicios de los gatos. Estos elementos contienen grmenes que pueden causar defectos de nacimiento en el beb.  Tome las vitaminas indicadas para la etapa prenatal.  Pruebe un laxante (si el mdico la autoriza) si tiene constipacin. Consuma ms alimentos ricos en fibra, como vegetales y frutas frescos y cereales enteros. Beba gran cantidad de lquido para mantener la orina de tono claro o amarillo plido.  Tome baos de agua tibia para Primary school teacher o las molestias causadas por las hemorroides. Use una crema para las hemorroides si el mdico la autoriza.  Si tiene venas varicosas,  use medias de soporte. Eleve los pies durante 15 minutos, 3 4 veces por da. Limite el consumo de sal en su dieta.  Evite levantar objetos pesados, use zapatos de tacones bajos y [place].  Descanse con las piernas elevadas si tiene calambres o dolor de cintura.  Visite a su dentista si no lo ha Occupational hygienist. Use un cepillo de dientes blando para higienizarse los dientes y use suavemente el hilo dental.  Puede continuar su vida sexual excepto que el mdico le indique otra cosa.  No haga viajes largos excepto que sea absolutamente necesario y slo con la aprobacin de su mdico.  Tome clases prenatales para entender, Education administrator y hacer preguntas sobre el [place] de parto y el alumbramiento.  Haga un ensayo sobre la partida al hospital.  Prepare el bolso que llevar al hospital.  Prepare la habitacin del beb.  Contine concurriendo a todas las visitas prenatales segn las indicaciones de su mdico. SOLICITE ATENCIN MDICA SI:   No est segura si est en trabajo de parto o ha roto la bolsa de aguas.  Tiene mareos.  Siente clicos leves, presin en la pelvis o  dolor persistente en el abdomen.  Tiene nuseas o vmitos persistentes.  Brett Fairy secrecin vaginal con mal olor.  Siente dolor al [employer]. SOLICITE ATENCIN MDICA DE INMEDIATO SI:   Tiene fiebre.  Pierde lquido o sangre por la vagina.  Tiene sangrado o pequeas prdidas vaginales.  Siente dolor intenso o clicos en el abdomen.  Sube o baja de peso rpidamente.  Le falta el aire y le duele el pecho al respirar.  Sbitamente se le hincha el rostro, las manos, los tobillos, los pies o las piernas de [place].  No ha sentido los movimientos del beb durante Georgianne Fick.  Siente un dolor de cabeza intenso que no se alivia con medicamentos.  Su visin se modifica. Document Released: 12/09/2004 Document Revised: 11/01/2012 [facility] Patient Information 2014 [place],  [place].

## 2013-03-31 NOTE — Progress Notes (Signed)
S: having some tightening of the abdomen 2 times per day and sometimes at night, lasts for 20 minutes. Also tightening of the vagina with walking. She had an episode of nausea and emesis of yellow sputum last Tuesday. Has not had any problems since. Feeling fetal movement.  O: see flowsheet Pelvic exam: normal external genitalia, vulva, vagina, cervix, gravid uterus Vertex position confirmed on bedside ultrasound A/P:  33 yo G1P0 at 7437.1 wga who presents for routine prenatal visit. - GC/Chl and GBS obtained today - recent CBC showing platelet count of 108. Spoke with Dr. Shawnie PonsPratt who agrees that this is likely gestational thrombocytopenia. She recommends continued observation and getting CBC when patient is admitted for labor to assess platelet count in setting of getting epidural.  - plans to breastfeed - follow up with OB clinic next week

## 2013-04-01 LAB — STREP B DNA PROBE: GBSP: NEGATIVE

## 2013-04-05 ENCOUNTER — Ambulatory Visit (INDEPENDENT_AMBULATORY_CARE_PROVIDER_SITE_OTHER): Payer: Medicaid Other | Admitting: Family Medicine

## 2013-04-05 VITALS — BP 110/71 | Temp 98.4°F | Wt 116.0 lb

## 2013-04-05 DIAGNOSIS — Z3493 Encounter for supervision of normal pregnancy, unspecified, third trimester: Secondary | ICD-10-CM

## 2013-04-05 DIAGNOSIS — Z3480 Encounter for supervision of other normal pregnancy, unspecified trimester: Secondary | ICD-10-CM

## 2013-04-05 NOTE — Patient Instructions (Signed)
Información sobre el parto prematuro   (Preterm Labor Information)   Se llama parto prematuro cuando se inicia antes de las 37 semanas de embarazo. La duración de un embarazo normal es de 39 a 41 semanas.   CAUSAS   Generalmente las causas del parto prematuro no se conocen. La causa más frecuente conocida es una infección.   FACTORES DE RIESGO   · Historia previa de parto prematuro.  · Romper la bolsa de aguas antes de tiempo.  · La placenta cubre la abertura del cuello.  · La placenta se despega del útero.  · El cuello es demasiado débil para contener al bebé en el útero.  · Hay mucho líquido en el saco amniótico.  · Consumo de drogas o hábito de fumar durante el embarazo.  · No aumentar de peso lo suficiente durante el embarazo.  · Mujeres menores de 18 años o mayores de 35 años.  · Tener bajos ingresos.  · Pertenecer a la raza afroamericana.  SÍNTOMAS   · Cólicos similares a los menstruales, dolor en el vientre (abdominal) o dolor en la espalda.  · Contracciones regulares, tan frecuentes como seis en una hora. Pueden ser suaves o dolorosas.  · Contracciones que comienzan en la parte superior del vientre. Luego bajan hacia la zona inferior del vientre y hacia la espalda.  · Presión en la zona inferior del vientre que parece empeorar.  · Sangrado que proviene de la vagina.  · Pérdida de líquido por la vagina.  TRATAMIENTO   El tratamiento depende de:   · Su estado.  · El estado del bebé.  · Cuántas semanas tiene de embarazo.  El médico podrá indicarle:   · Medicamentos para detener las contracciones.  · Que permanezca en la cama excepto para ir al baño (reposo en cama).  · Que permanezca en el hospital.  ¿QUÉ DEBE HACER SI PIENSA QUE ESTÁ EN TRABAJO DE PARTO PREMATURO?   Comuníquese con su médico de inmediato. Debe concurrir al hospital para ser controlada inmediatamente.   ¿CÓMO PUEDE EVITAR EL TRABAJO DE PARTO PREMATURO EN FUTUROS EMBARAZOS?   · Si fuma, abandone el hábito.  · Mantenga un aumento de peso  saludable.  · Notome drogas ni manipule sustancias químicas que no necesita.  · Informe a su médico si piensa que tiene una infección.  · Informe a su médico si tuvo un trabajo de parto prematuro anteriormente.  Document Released: 04/03/2010 Document Revised: 11/01/2012  ExitCare® Patient Information ©2014 ExitCare, LLC.

## 2013-04-05 NOTE — Progress Notes (Signed)
33 Y/O F  G1P0 at 6738 w GA here for routine prenatal follow up,denies any issue at the moment, this morning,she fell dull pain in her lower abdomen for about 5 min which resolved completely by itself. She denies any vaginal discharge or bleeding, baby moves adequately. Feels well about pregnancy so far, her only concern is about her platelet which is low.  Exam: Check flow sheet.  U/S bedside: Cephalic presentation with visualization of fetal cardiac pulsation. PHQ2= 0  A/P: 33 Y/O F @ 38 w GA         Pregnancy progressing well.         + Gestational thrombocytopenia         As discussed with patient,plan to check CBC at delivery to assess her platelet and bleeding risk.         Labor precaution instruction given.         Fetal kick count encouraged.         She plans to BF.         Depo for contraception after delivery.         F/U in 1 wk with Dr Gwenlyn SaranLosq.

## 2013-04-09 ENCOUNTER — Encounter (HOSPITAL_COMMUNITY): Payer: Self-pay | Admitting: *Deleted

## 2013-04-09 ENCOUNTER — Inpatient Hospital Stay (HOSPITAL_COMMUNITY)
Admission: AD | Admit: 2013-04-09 | Discharge: 2013-04-09 | Disposition: A | Discharge status: Home / Self Care | Payer: Medicaid Other | Source: Ambulatory Visit | Attending: Obstetrics & Gynecology | Admitting: Obstetrics & Gynecology

## 2013-04-09 ENCOUNTER — Telehealth: Payer: Self-pay | Admitting: *Deleted

## 2013-04-09 DIAGNOSIS — O26859 Spotting complicating pregnancy, unspecified trimester: Secondary | ICD-10-CM | POA: Insufficient documentation

## 2013-04-09 DIAGNOSIS — R109 Unspecified abdominal pain: Secondary | ICD-10-CM | POA: Insufficient documentation

## 2013-04-09 DIAGNOSIS — Z3400 Encounter for supervision of normal first pregnancy, unspecified trimester: Secondary | ICD-10-CM

## 2013-04-09 DIAGNOSIS — O479 False labor, unspecified: Secondary | ICD-10-CM

## 2013-04-09 DIAGNOSIS — M549 Dorsalgia, unspecified: Secondary | ICD-10-CM | POA: Insufficient documentation

## 2013-04-09 HISTORY — DX: Anxiety disorder, unspecified: F41.9

## 2013-04-09 HISTORY — DX: Anemia, unspecified: D64.9

## 2013-04-09 NOTE — Discharge Instructions (Signed)
Hemorragia vaginal durante el embarazo (tercer trimestre) (Vaginal Bleeding During Pregnancy, Third Trimester) Durante el embarazo es relativamente frecuente que se presente una pequea hemorragia (manchas). Hay diversos factores que pueden causar hemorragia o Games developermanchas en el embarazo. A veces, la hemorragia es normal y no es un problema. No obstante, si esto sucede durante el tercer trimestre, puede ser un signo de afeccin grave de la madre y el beb. Debe informar a su mdico de inmediato si tiene alguna hemorragia vaginal.  Algunas causas posibles de hemorragia vaginal durante el tercer trimestre incluyen:   La placenta puede estar cubriendo la apertura del tero de [place] total o parcial (placenta previa).  La placenta puede haberse separado del tero (abrupcin placentaria).  Puede haber infeccin o una masa en el cuello del tero.  Puede ser que se est iniciando el trabajo de parto, entonces se denomina "prdida del tapn mucoso".  La placenta puede desarrollarse en la capa muscular del tero (placenta acreta). INSTRUCCIONES PARA EL CUIDADO EN EL HOGAR  Controle su afeccin para ver si hay cambios. Las siguientes indicaciones ayudarn a Psychologist, educationalaliviar cualquier Longs Drug Storesmolestia que pueda sentir:   Siga las indicaciones del mdico para restringir su actividad. Si el mdico le indica descanso en la cama, debe quedarse en la cama y levantarse solo para ir al bao. No obstante, el mdico puede permitirle que contine realizando tareas livianas.  Si es necesario, organcese para que alguien le ayude con las actividades y responsabilidades cotidianas mientras est en cama.  Lleve un registro de la cantidad y Scientist, forensicel grado de saturacin de las toallas higinicas que [name] cada da. Anote este dato.  No use tampones.No se haga duchas vaginales.  No tenga relaciones sexuales u orgasmos hasta que el mdico la autorice.  Siga los consejos del mdico acerca de levantar objetos pesados, conducir y Education officer, environmentalrealizar  actividad fsica.  Si elimina tejido por la vagina, gurdelo para mostrrselo al American Expressmdico.  [place] solo medicamentos de venta libre o recetados, segn las indicaciones del mdico.  No  tome aspirina, ya que puede causar hemorragias.  Cumpla con todas las visitas de control, segn le indique su mdico. SOLICITE ATENCIN MDICA SI:  Tiene un sangrado vaginal en cualquier momento del embarazo.  Tiene calambres o dolores de [place]part[place]. SOLICITE ATENCIN MDICA DE INMEDIATO SI:   Siente calambres intensos o dolor en la espalda o en el vientre (abdomen).  Tiene fiebre y no puede bajarla con medicamentos.  Tiene escalofros.  Tiene una prdida de lquido por la vagina.  Elimina cogulos grandes o tejido por la vagina.  La hemorragia aumenta.  Se siente mareada o dbil.  Se desmaya.  Siente que el beb se mueve menos o no se mueve en absoluto. ASEGRESE DE QUE:  Comprende estas instrucciones.  Controlar su afeccin.  Recibir ayuda de inmediato si no mejora o si empeora. Document Released: 12/09/2004 Document Revised: 12/20/2012 [facility] Patient Information 2014 [place], [place].

## 2013-04-09 NOTE — MAU Note (Signed)
Some increase in discomfort. Signs of labor explained by CNM

## 2013-04-09 NOTE — MAU Note (Signed)
Scant lt brown smear noted on pad, that pt wore in. No active bleeding noted prior to exam.  Pt had denied any hx of low lying or placenta previa.  This is first episode of seeing any blood.

## 2013-04-09 NOTE — MAU Provider Note (Signed)
History    CSN: [id number]  Arrival date and time: 04/09/13 1122   First Provider Initiated Contact with Patient 04/09/13 1437      Chief Complaint  Patient presents with  . Back Pain  . Vaginal Bleeding   HPI  33 y.o. G1P0 at 38+4 presents with vaginal bleeding and abdominal pain.  She noticed vaginal blood going into to the toilet as she was urinating.  Characterized as brown, not bright red. One episode earlier today.  Abd pain is described as mild, crampy, intermittent, epigastric and left upper quadrant where fetus is resting currently.  Pain has not moved, radiated.  Somewhat worse today than last night, when pain started.  No GI symptoms. No trauma.   Past Medical History  Diagnosis Date  . Anxiety     no meds, doing ok  . Anemia     Past Surgical History  Procedure Laterality Date  . Esophagus surgery  ~2004    laparoscopic, ? opened up esophagus so she could eat/swallow    Family History  Problem Relation Age of Onset  . Hearing loss Neg Hx     History  Substance Use Topics  . Smoking status: Never Smoker   . Smokeless tobacco: Never Used  . Alcohol Use: No    Allergies: No Known Allergies  Prescriptions prior to admission  Medication Sig Dispense Refill  . Prenatal Vit-Fe Fumarate-FA (PRENATAL MULTIVITAMIN) TABS tablet Take 1 tablet by mouth daily at 12 noon.      . ranitidine (ZANTAC) 150 MG tablet Take 1 tablet (150 mg total) by mouth 2 (two) times daily.  60 tablet  2    Review of Systems  Constitutional: Negative for fever.  Eyes: Negative for blurred vision and double vision.  Respiratory: Negative for cough and shortness of breath.   Cardiovascular: Negative for chest pain.  Gastrointestinal: Negative for vomiting and diarrhea.  Neurological: Negative for dizziness and headaches.   Physical Exam   Blood pressure 99/63, pulse 74, temperature 98.3 F (36.8 C), temperature source Oral, resp. rate 16, height 4\' 9"  (1.448 m), weight 53.706 kg  (118 lb 6.4 oz), last menstrual period 07/13/2012.  Physical Exam  Constitutional: She is oriented to person, place, and time. She appears well-developed and well-nourished. No distress.  HENT:  Head: Normocephalic and atraumatic.  Eyes: Conjunctivae are normal.  Cardiovascular: Normal rate, regular rhythm, normal heart sounds and intact distal pulses.   Respiratory: Effort normal and breath sounds normal.  GI:  Gravid uterus, nontender abdomen  Genitourinary: Vagina normal and uterus normal.  Cervix not visualized on speculum exam.  Patient tolerated exam well.  Brown mucous discharge present. No bright red blood.  Neurological: She is alert and oriented to person, place, and time.  Skin: Skin is warm and dry. She is not diaphoretic.  Psychiatric: She has a normal mood and affect. Her behavior is normal. Thought content normal.      MAU Course  Procedures - Cvx long thick closed per nurse.   - Fetal heart tracing reassuring for gestational age.Baseline 135. Pos accels. Neg for decels. Irregular contractions. Cat. 1.    Assessment and Plan   Threatened Labor, Third Trimester Vaginal spotting - Slight light brown mucous likely due to mucous/ cervical change.  - Discharge home - Provided patient education including red flags and reasons to return to the MAU. Spanish Language Interpreter present and facilitated conversation.  All questions answered.   Quincy Simmonds 04/09/2013, 2:58 PM   I  have seen this patient and agree with the above resident's note with the following additions.  No recent intercourse, cervical exams, or anything per vagina in 24 hours.  Cervix visualized and closed per my speculum exam immediately following resident.  Scant mucus tinged with brown noted in vaginal vault and on glove.  No bright red blood visualized.  Category 1 FHR tracing and pt reports good fetal movement.  Labor precautions/warning signs given.  F/U as scheduled with Family Practice.    LEFTWICH-KIRBY, Vasilia Dise Certified Nurse-Midwife

## 2013-04-09 NOTE — Telephone Encounter (Signed)
Patient walked into clinic this AM complaining of sharp/cramp pain in lower back every half hour since 9 AM. She also noticed small amount of brown discharged per interpreter Porfirio MylarCarmen (ID # [phone]).  Pt is 38.[redacted] weeks pregnant. Spoke with Dr. Gwenlyn SaranLosq, [facility]. Clovis PuMartin, Nyashia Raney L, RN

## 2013-04-09 NOTE — MAU Note (Signed)
Pain in low back started yesterday, comes and goes.  abd has been getting hard/ tight.  No loss of fluid, noted blood when went to the bathroom this morning

## 2013-04-11 ENCOUNTER — Inpatient Hospital Stay (HOSPITAL_COMMUNITY)
Admission: AD | Admit: 2013-04-11 | Discharge: 2013-04-15 | DRG: 774 | Disposition: A | Discharge status: Home / Self Care | Payer: Medicaid Other | Source: Ambulatory Visit | Attending: Obstetrics & Gynecology | Admitting: Obstetrics & Gynecology

## 2013-04-11 ENCOUNTER — Encounter (HOSPITAL_COMMUNITY): Payer: Self-pay | Admitting: *Deleted

## 2013-04-11 ENCOUNTER — Ambulatory Visit (INDEPENDENT_AMBULATORY_CARE_PROVIDER_SITE_OTHER): Payer: Medicaid Other | Admitting: Family Medicine

## 2013-04-11 VITALS — BP 96/71 | Temp 98.2°F | Wt 117.0 lb

## 2013-04-11 DIAGNOSIS — O4290 Premature rupture of membranes, unspecified as to length of time between rupture and onset of labor, unspecified weeks of gestation: Secondary | ICD-10-CM | POA: Diagnosis present

## 2013-04-11 DIAGNOSIS — F411 Generalized anxiety disorder: Secondary | ICD-10-CM | POA: Diagnosis present

## 2013-04-11 DIAGNOSIS — O1414 Severe pre-eclampsia complicating childbirth: Principal | ICD-10-CM | POA: Diagnosis present

## 2013-04-11 DIAGNOSIS — Z331 Pregnant state, incidental: Secondary | ICD-10-CM

## 2013-04-11 DIAGNOSIS — D689 Coagulation defect, unspecified: Secondary | ICD-10-CM | POA: Diagnosis present

## 2013-04-11 DIAGNOSIS — O9912 Other diseases of the blood and blood-forming organs and certain disorders involving the immune mechanism complicating childbirth: Secondary | ICD-10-CM

## 2013-04-11 DIAGNOSIS — D696 Thrombocytopenia, unspecified: Secondary | ICD-10-CM | POA: Diagnosis present

## 2013-04-11 DIAGNOSIS — O99344 Other mental disorders complicating childbirth: Secondary | ICD-10-CM | POA: Diagnosis present

## 2013-04-11 HISTORY — DX: Thrombocytopenia, unspecified: D69.6

## 2013-04-11 HISTORY — DX: Gastro-esophageal reflux disease without esophagitis: K21.9

## 2013-04-11 LAB — CBC
HCT: 36.3 % (ref 36.0–46.0)
Hemoglobin: 12.5 g/dL (ref 12.0–15.0)
MCH: 32.2 pg (ref 26.0–34.0)
MCHC: 34.4 g/dL (ref 30.0–36.0)
MCV: 93.6 fL (ref 78.0–100.0)
PLATELETS: 101 10*3/uL — AB (ref 150–400)
RBC: 3.88 MIL/uL (ref 3.87–5.11)
RDW: 13.4 % (ref 11.5–15.5)
WBC: 9.3 10*3/uL (ref 4.0–10.5)

## 2013-04-11 LAB — POCT FERNING: Ferning, POC: NEGATIVE

## 2013-04-11 LAB — AMNISURE RUPTURE OF MEMBRANE (ROM) NOT AT ARMC: Amnisure ROM: POSITIVE

## 2013-04-11 LAB — POCT FERN TEST: POCT FERN TEST: NEGATIVE

## 2013-04-11 LAB — ABO/RH: ABO/RH(D): O POS

## 2013-04-11 LAB — RPR: RPR: NONREACTIVE

## 2013-04-11 LAB — TYPE AND SCREEN
ABO/RH(D): O POS
Antibody Screen: NEGATIVE

## 2013-04-11 MED ORDER — LIDOCAINE HCL (PF) 1 % IJ SOLN
30.0000 mL | INTRAMUSCULAR | Status: DC | PRN
Start: 2013-04-11 — End: 2013-04-12

## 2013-04-11 MED ORDER — CITRIC ACID-SODIUM CITRATE 334-500 MG/5ML PO SOLN
30.0000 mL | ORAL | Status: DC | PRN
  Administered 2013-04-12: 30 mL via ORAL
  Filled 2013-04-11: qty 15

## 2013-04-11 MED ORDER — OXYTOCIN BOLUS FROM INFUSION
500.0000 mL | INTRAVENOUS | Status: DC
  Administered 2013-04-12: 500 mL via INTRAVENOUS

## 2013-04-11 MED ORDER — ACETAMINOPHEN 325 MG PO TABS: 650.0000 mg | ORAL_TABLET | ORAL | Status: DC | PRN

## 2013-04-11 MED ORDER — TERBUTALINE SULFATE 1 MG/ML IJ SOLN: 0.2500 mg | Freq: Once | INTRAMUSCULAR | Status: AC | PRN

## 2013-04-11 MED ORDER — LACTATED RINGERS IV SOLN
INTRAVENOUS | Status: DC
  Administered 2013-04-11 – 2013-04-12 (×3): via INTRAVENOUS
  Administered 2013-04-12: 1000 mL via INTRAVENOUS
  Administered 2013-04-12: 20:00:00 via INTRAVENOUS

## 2013-04-11 MED ORDER — ONDANSETRON HCL 4 MG/2ML IJ SOLN
4.0000 mg | Freq: Four times a day (QID) | INTRAMUSCULAR | Status: DC | PRN
  Administered 2013-04-12 (×2): 4 mg via INTRAVENOUS
  Filled 2013-04-11 (×2): qty 2

## 2013-04-11 MED ORDER — LACTATED RINGERS IV SOLN
500.0000 mL | INTRAVENOUS | Status: DC | PRN
  Administered 2013-04-12: 500 mL via INTRAVENOUS

## 2013-04-11 MED ORDER — OXYCODONE-ACETAMINOPHEN 5-325 MG PO TABS: 1.0000 | ORAL_TABLET | ORAL | Status: DC | PRN

## 2013-04-11 MED ORDER — OXYTOCIN 40 UNITS IN LACTATED RINGERS INFUSION - SIMPLE MED
62.5000 mL/h | INTRAVENOUS | Status: DC
  Administered 2013-04-12: 62.5 mL/h via INTRAVENOUS
  Filled 2013-04-11: qty 1000

## 2013-04-11 MED ORDER — MISOPROSTOL 25 MCG QUARTER TABLET
25.0000 ug | ORAL_TABLET | ORAL | Status: DC | PRN
  Administered 2013-04-11 – 2013-04-12 (×2): 25 ug via VAGINAL
  Filled 2013-04-11 (×3): qty 0.25

## 2013-04-11 MED ORDER — IBUPROFEN 600 MG PO TABS: 600.0000 mg | ORAL_TABLET | Freq: Four times a day (QID) | ORAL | Status: DC | PRN

## 2013-04-11 MED ORDER — FENTANYL CITRATE 0.05 MG/ML IJ SOLN
100.0000 ug | INTRAMUSCULAR | Status: DC | PRN
  Administered 2013-04-11 – 2013-04-12 (×4): 100 ug via INTRAVENOUS
  Filled 2013-04-11 (×4): qty 2

## 2013-04-11 NOTE — Progress Notes (Signed)
S: was seen at MAU for evaluation of labor and was not found to be in active labor. Since then, she continues to have low back pain as well as tightening of the abdomen every 30 minutes or so, lasting 5minutes. She reports feeling clear liquid mixed with blood earlier this morning at 7am. She has not had anymore occurrence of this. She has been having some bloody show. Last time of intercourse was Saturday. Feeling fetal movement O: see flowsheet Speculum exam suspicious for pooling. Cervix appears closed on inspection.  nitrazine difficult to interpret due to blood.  Fern test: one area suspicious for ferning A/P:  33 yo G1P0 at 38.6wga who presents for prenatal visit.  - questionable rupture of membranes: suspicious for rupture, but no test was frankly positive. Did not have amniosure test available in clinic. Unfortunately, a speculum exam was done which will likely increase the rate of false positive of the amniosure. Told patient to go to MAU for further confirmatory testing.  - gestational thrombocytopenia: will need to get CBC checked when admitted for labor.

## 2013-04-11 NOTE — Patient Instructions (Signed)
I would [facility] to get [facility] for membrane rupture. Here in the clinic, the testing was negative, but on exam it did look suspicious for fluid. [facility] able [facility] a test that will be more precise.   If everything is ok, please follow up with me in 1 week.

## 2013-04-11 NOTE — MAU Provider Note (Signed)
History     CSN: [id number]  Arrival date and time: 04/11/13 1347   First Provider Initiated Contact with Patient 04/11/13 1527      Chief Complaint  Patient presents with  . Rupture of Membranes   HPI Comments: Hannah Orozco is a 33 yo G1P0 at [redacted]w[redacted]d, reporting to the MAU for evaluation of labor and possible ROM. This morning, around 7am, the patient experience a small gush of clear fluid along with vaginal bleeding.  The patient has occasional contractions that she describes as moderate in strength. She was seen in family practice today and they were not able to give an definite dx of ROM. The patient was sent to MAU for confirmation of ROM and labor evaluation. She was also seen in the MAU two days ago for blood show, back pain, and contractions. She was found not to be in active labor.     Past Medical History  Diagnosis Date  . Anxiety     no meds, doing ok  . Anemia   . Thrombocytopenia     Past Surgical History  Procedure Laterality Date  . Esophagus surgery  ~2004    laparoscopic, ? opened up esophagus so she could eat/swallow    Family History  Problem Relation Age of Onset  . Hearing loss Neg Hx     History  Substance Use Topics  . Smoking status: Never Smoker   . Smokeless tobacco: Never Used  . Alcohol Use: No    Allergies: No Known Allergies  Prescriptions prior to admission  Medication Sig Dispense Refill  . Prenatal Vit-Fe Fumarate-FA (PRENATAL MULTIVITAMIN) TABS tablet Take 1 tablet by mouth daily at 12 noon.      . ranitidine (ZANTAC) 150 MG tablet Take 1 tablet (150 mg total) by mouth 2 (two) times daily.  60 tablet  2    Review of Systems  Constitutional: Negative.   HENT: Negative.   Eyes: Negative.   Respiratory: Negative.   Cardiovascular: Negative.   Gastrointestinal: Positive for abdominal pain.  Genitourinary:       Leaking of fluid, vaginal bleeding  Musculoskeletal: Negative.   Skin: Negative.   Neurological: Negative.    Endo/Heme/Allergies: Negative.   Psychiatric/Behavioral: Negative.    Physical Exam   Blood pressure 117/70, pulse 91, temperature 98.6 F (37 C), temperature source Oral, resp. rate 16, height 4' 8.5" (1.435 m), weight 53.887 kg (118 lb 12.8 oz), last menstrual period 07/13/2012, SpO2 99.00%.  Physical Exam  Constitutional: She is oriented to person, place, and time. She appears well-developed and well-nourished.  HENT:  Head: Normocephalic and atraumatic.  Eyes: Conjunctivae are normal.  Neck: Normal range of motion. Neck supple.  Cardiovascular: Normal rate, regular rhythm and normal heart sounds.   Respiratory: Effort normal and breath sounds normal.  GI: Soft.  Genitourinary:  Gravid uterus, vaginal bleeding  Musculoskeletal: Normal range of motion.  Neurological: She is alert and oriented to person, place, and time. She has normal reflexes.  Skin: Skin is warm and dry.  Psychiatric: She has a normal mood and affect. Her behavior is normal. Judgment and thought content normal.    Dilation: Closed Effacement (%): Thick Station:  (high) Exam by:: Susann Givens CNM student  EFM: Baseline 135, Moderate variability, Accels Present, No Decels   MAU Course  Procedures  MDM SVE >> Negative for pooling Wet prep >>Negative for ferning Amniosure   Assessment and Plan  SIUP at 38.6w PROM  P: Admit to [facility] for Induction  of Labor due to PROM   Selena LesserBraimah, Tina 04/11/2013, 3:46 PM  Evaluation and management procedures were performed by SNM under my supervision/collaboration. Chart reviewed, patient examined by me and I agree with management and plan. Also see note from Dr. Gwenlyn SaranLosq earlier today. Amnisure positive. PMD Dr. Gwenlyn SaranLosq notified> admit for cervical ripening with cytotec Danae Orleanseirdre C Samaia Iwata, CNM 04/11/2013 6:49 PM

## 2013-04-11 NOTE — H&P (Signed)
History     CSN: [id number]  Arrival date and time: 04/11/13 1347   First Provider Initiated Contact with Patient 04/11/13 1527      Chief Complaint  Patient presents with  . Rupture of Membranes   HPI Comments: Mrs. Hannah Orozco is a 33 yo G1P0 at 3432w6d, reporting to the MAU for evaluation of labor and possible ROM. This morning, around 7am, the patient experience a small gush of clear fluid along with vaginal bleeding.  The patient has occasional contractions that she describes as moderate in strength. She was seen in family practice today and they were not able to give an definite dx of ROM. The patient was sent to MAU for confirmation of ROM and labor evaluation. She was also seen in the MAU two days ago for blood show, back pain, and contractions. She was found not to be in active labor.     Past Medical History  Diagnosis Date  . Anxiety     no meds, doing ok  . Anemia   . Thrombocytopenia     Past Surgical History  Procedure Laterality Date  . Esophagus surgery  ~2004    laparoscopic, ? opened up esophagus so she could eat/swallow    Family History  Problem Relation Age of Onset  . Hearing loss Neg Hx     History  Substance Use Topics  . Smoking status: Never Smoker   . Smokeless tobacco: Never Used  . Alcohol Use: No    Allergies: No Known Allergies  Prescriptions prior to admission  Medication Sig Dispense Refill  . Prenatal Vit-Fe Fumarate-FA (PRENATAL MULTIVITAMIN) TABS tablet Take 1 tablet by mouth daily at 12 noon.      . ranitidine (ZANTAC) 150 MG tablet Take 1 tablet (150 mg total) by mouth 2 (two) times daily.  60 tablet  2    Review of Systems  Constitutional: Negative.   HENT: Negative.   Eyes: Negative.   Respiratory: Negative.   Cardiovascular: Negative.   Gastrointestinal: Positive for abdominal pain.  Genitourinary:       Leaking of fluid, vaginal bleeding  Musculoskeletal: Negative.   Skin: Negative.   Neurological: Negative.    Endo/Heme/Allergies: Negative.   Psychiatric/Behavioral: Negative.    Physical Exam   Blood pressure 117/70, pulse 91, temperature 98.6 F (37 C), temperature source Oral, resp. rate 16, height 4' 8.5" (1.435 m), weight 53.887 kg (118 lb 12.8 oz), last menstrual period 07/13/2012, SpO2 99.00%.  Physical Exam  Constitutional: She is oriented to person, place, and time. She appears well-developed and well-nourished.  HENT:  Head: Normocephalic and atraumatic.  Eyes: Conjunctivae are normal.  Neck: Normal range of motion. Neck supple.  Cardiovascular: Normal rate, regular rhythm and normal heart sounds.   Respiratory: Effort normal and breath sounds normal.  GI: Soft.  Genitourinary:  Gravid uterus, vaginal bleeding  Musculoskeletal: Normal range of motion.  Neurological: She is alert and oriented to person, place, and time. She has normal reflexes.  Skin: Skin is warm and dry.  Psychiatric: She has a normal mood and affect. Her behavior is normal. Judgment and thought content normal.    Dilation: Closed Effacement (%): Thick Station:  (high) Exam by:: Susann Givens. Braimah CNM student  EFM: Baseline 135, Moderate variability, Accels Present, No Decels   MAU Course  Procedures  MDM SVE >> Negative for pooling Wet prep >> Negative for ferning Amniosure >> Positive   Assessment and Plan  SIUP at 38.6w PROM  [facility] of [facility] PROM Induce labor with cytotec and add pitocin if necessary   Selena Lesser 04/11/2013, 3:46 PM   Evaluation and management procedures were performed by SNM under my supervision/collaboration. Chart reviewed, patient examined by me and I agree with management and plan. Notified PMD, Marena Chancy, of planned admission. Danae Orleans, CNM 04/12/2013 1:22 PM

## 2013-04-11 NOTE — MAU Note (Signed)
[facility]. States she started leaking bloody fluid at 0700. Was not clearly negative for amniotic fluid at clinic.No active leaking at this time. Reports good fetal movement. Reports contractions every 30 minutes.

## 2013-04-12 ENCOUNTER — Encounter (HOSPITAL_COMMUNITY): Payer: Medicaid Other | Admitting: Anesthesiology

## 2013-04-12 ENCOUNTER — Inpatient Hospital Stay (HOSPITAL_COMMUNITY): Payer: Medicaid Other | Admitting: Anesthesiology

## 2013-04-12 DIAGNOSIS — O9912 Other diseases of the blood and blood-forming organs and certain disorders involving the immune mechanism complicating childbirth: Secondary | ICD-10-CM

## 2013-04-12 DIAGNOSIS — D696 Thrombocytopenia, unspecified: Secondary | ICD-10-CM

## 2013-04-12 DIAGNOSIS — O09299 Supervision of pregnancy with other poor reproductive or obstetric history, unspecified trimester: Secondary | ICD-10-CM

## 2013-04-12 DIAGNOSIS — D689 Coagulation defect, unspecified: Secondary | ICD-10-CM

## 2013-04-12 DIAGNOSIS — O4290 Premature rupture of membranes, unspecified as to length of time between rupture and onset of labor, unspecified weeks of gestation: Secondary | ICD-10-CM

## 2013-04-12 DIAGNOSIS — O1414 Severe pre-eclampsia complicating childbirth: Secondary | ICD-10-CM

## 2013-04-12 HISTORY — DX: Supervision of pregnancy with other poor reproductive or obstetric history, unspecified trimester: O09.299

## 2013-04-12 LAB — COMPREHENSIVE METABOLIC PANEL
ALK PHOS: 289 U/L — AB (ref 39–117)
ALT: 10 U/L (ref 0–35)
AST: 22 U/L (ref 0–37)
Albumin: 2.8 g/dL — ABNORMAL LOW (ref 3.5–5.2)
BUN: 8 mg/dL (ref 6–23)
CO2: 21 meq/L (ref 19–32)
Calcium: 9 mg/dL (ref 8.4–10.5)
Chloride: 101 mEq/L (ref 96–112)
Creatinine, Ser: 0.61 mg/dL (ref 0.50–1.10)
GLUCOSE: 111 mg/dL — AB (ref 70–99)
POTASSIUM: 3.8 meq/L (ref 3.7–5.3)
Sodium: 139 mEq/L (ref 137–147)
TOTAL PROTEIN: 6.6 g/dL (ref 6.0–8.3)
Total Bilirubin: 0.4 mg/dL (ref 0.3–1.2)

## 2013-04-12 LAB — CBC
HEMATOCRIT: 35.1 % — AB (ref 36.0–46.0)
HEMATOCRIT: 36.9 % (ref 36.0–46.0)
HEMATOCRIT: 34.7 % — AB (ref 36.0–46.0)
HEMOGLOBIN: 11.9 g/dL — AB (ref 12.0–15.0)
Hemoglobin: 12.2 g/dL (ref 12.0–15.0)
Hemoglobin: 12.7 g/dL (ref 12.0–15.0)
MCH: 32.4 pg (ref 26.0–34.0)
MCH: 32 pg (ref 26.0–34.0)
MCH: 32 pg (ref 26.0–34.0)
MCHC: 34.8 g/dL (ref 30.0–36.0)
MCHC: 34.4 g/dL (ref 30.0–36.0)
MCHC: 34.3 g/dL (ref 30.0–36.0)
MCV: 93.1 fL (ref 78.0–100.0)
MCV: 92.9 fL (ref 78.0–100.0)
MCV: 93.3 fL (ref 78.0–100.0)
Platelets: 88 10*3/uL — ABNORMAL LOW (ref 150–400)
Platelets: 93 10*3/uL — ABNORMAL LOW (ref 150–400)
Platelets: 97 10*3/uL — ABNORMAL LOW (ref 150–400)
RBC: 3.77 MIL/uL — ABNORMAL LOW (ref 3.87–5.11)
RBC: 3.97 MIL/uL (ref 3.87–5.11)
RBC: 3.72 MIL/uL — AB (ref 3.87–5.11)
RDW: 13.3 % (ref 11.5–15.5)
RDW: 13.3 % (ref 11.5–15.5)
RDW: 13.6 % (ref 11.5–15.5)
WBC: 12.7 10*3/uL — ABNORMAL HIGH (ref 4.0–10.5)
WBC: 15.5 10*3/uL — ABNORMAL HIGH (ref 4.0–10.5)
WBC: 24.1 10*3/uL — ABNORMAL HIGH (ref 4.0–10.5)

## 2013-04-12 LAB — URIC ACID: URIC ACID, SERUM: 4.2 mg/dL (ref 2.4–7.0)

## 2013-04-12 LAB — LACTATE DEHYDROGENASE: LDH: 209 U/L (ref 94–250)

## 2013-04-12 MED ORDER — PHENYLEPHRINE 40 MCG/ML (10ML) SYRINGE FOR IV PUSH (FOR BLOOD PRESSURE SUPPORT): 80.0000 ug | PREFILLED_SYRINGE | INTRAVENOUS | Status: DC | PRN

## 2013-04-12 MED ORDER — FENTANYL 2.5 MCG/ML BUPIVACAINE 1/10 % EPIDURAL INFUSION (WH - ANES)
14.0000 mL/h | INTRAMUSCULAR | Status: DC | PRN
  Administered 2013-04-12 (×2): 14 mL/h via EPIDURAL
  Filled 2013-04-12: qty 125

## 2013-04-12 MED ORDER — TERBUTALINE SULFATE 1 MG/ML IJ SOLN: 0.2500 mg | Freq: Once | INTRAMUSCULAR | Status: DC | PRN

## 2013-04-12 MED ORDER — FENTANYL 2.5 MCG/ML BUPIVACAINE 1/10 % EPIDURAL INFUSION (WH - ANES)
INTRAMUSCULAR | Status: AC
  Filled 2013-04-12: qty 125

## 2013-04-12 MED ORDER — LIDOCAINE HCL (PF) 1 % IJ SOLN
INTRAMUSCULAR | Status: DC | PRN
  Administered 2013-04-12 (×2): 5 mL

## 2013-04-12 MED ORDER — OXYTOCIN 40 UNITS IN LACTATED RINGERS INFUSION - SIMPLE MED
1.0000 m[IU]/min | INTRAVENOUS | Status: DC
  Administered 2013-04-12: 2 m[IU]/min via INTRAVENOUS
  Filled 2013-04-12: qty 1000

## 2013-04-12 MED ORDER — EPHEDRINE 5 MG/ML INJ: 10.0000 mg | INTRAVENOUS | Status: DC | PRN

## 2013-04-12 MED ORDER — MAGNESIUM SULFATE BOLUS VIA INFUSION
4.0000 g | Freq: Once | INTRAVENOUS | Status: DC
  Filled 2013-04-12: qty 500

## 2013-04-12 MED ORDER — PHENYLEPHRINE 40 MCG/ML (10ML) SYRINGE FOR IV PUSH (FOR BLOOD PRESSURE SUPPORT)
PREFILLED_SYRINGE | INTRAVENOUS | Status: AC
  Filled 2013-04-12: qty 10

## 2013-04-12 MED ORDER — EPHEDRINE 5 MG/ML INJ
INTRAVENOUS | Status: AC
  Filled 2013-04-12: qty 4

## 2013-04-12 MED ORDER — MAGNESIUM SULFATE 40 MG/ML IJ SOLN: 2.0000 g | Freq: Once | INTRAMUSCULAR | Status: DC

## 2013-04-12 MED ORDER — NALBUPHINE HCL 10 MG/ML IJ SOLN
10.0000 mg | INTRAMUSCULAR | Status: DC | PRN
  Administered 2013-04-12 (×2): 10 mg via INTRAMUSCULAR
  Filled 2013-04-12 (×2): qty 1

## 2013-04-12 MED ORDER — MAGNESIUM SULFATE 40 G IN LACTATED RINGERS - SIMPLE
2.0000 g/h | INTRAVENOUS | Status: DC
  Administered 2013-04-12: 2 g/h via INTRAVENOUS
  Filled 2013-04-12: qty 500

## 2013-04-12 MED ORDER — LACTATED RINGERS IV SOLN
500.0000 mL | Freq: Once | INTRAVENOUS | Status: AC
  Administered 2013-04-12: 500 mL via INTRAVENOUS

## 2013-04-12 MED ORDER — MAGNESIUM SULFATE 40 G IN LACTATED RINGERS - SIMPLE
2.0000 g/h | INTRAVENOUS | Status: DC
  Filled 2013-04-12: qty 500

## 2013-04-12 MED ORDER — NALBUPHINE HCL 10 MG/ML IJ SOLN
10.0000 mg | INTRAMUSCULAR | Status: DC | PRN
  Administered 2013-04-12 (×2): 10 mg via INTRAVENOUS
  Filled 2013-04-12 (×2): qty 1

## 2013-04-12 MED ORDER — DIPHENHYDRAMINE HCL 50 MG/ML IJ SOLN: 12.5000 mg | INTRAMUSCULAR | Status: DC | PRN

## 2013-04-12 MED ORDER — PROMETHAZINE HCL 25 MG/ML IJ SOLN
25.0000 mg | Freq: Four times a day (QID) | INTRAMUSCULAR | Status: DC | PRN
  Administered 2013-04-12: 25 mg via INTRAVENOUS
  Filled 2013-04-12: qty 1

## 2013-04-12 NOTE — Progress Notes (Signed)
Patient ID: [name], female   DOB: 09/09/1980, 33 y.o.   MRN: [id number] Waldemar DickensMaria L Rios-Leon is a 33 y.o. G1P0 at 1427w0d by admitted for induction of labor due to Spontaneous rupture of BOW.  Subjective:   Objective: BP 120/73  Pulse 89  Temp(Src) 99 F (37.2 C) (Oral)  Resp 18  Ht 4' 8.5" (1.435 m)  Wt 53.524 kg (118 lb)  BMI 25.99 kg/m2  SpO2 99%  LMP 07/13/2012      FHT:  FHR: 130 bpm, variability: moderate,  accelerations:  Present,  decelerations:  Absent UC:   regular, every 1 minute SVE:   Dilation: Fingertip Effacement (%): 50 Station: -2 Exam by:: Pincus BadderK. Shaw CNM  Labs: Lab Results  Component Value Date   WBC 9.3 04/11/2013   HGB 12.5 04/11/2013   HCT 36.3 04/11/2013   MCV 93.6 04/11/2013   PLT 101* 04/11/2013    Assessment / Plan: IOL process cervix unfavorable  2nd cytotec placed  Matea Stanard 04/12/2013, 1:16 AM

## 2013-04-12 NOTE — Anesthesia Preprocedure Evaluation (Signed)
Anesthesia Evaluation  Patient identified by MRN, date of birth, ID band Patient awake    Reviewed: Allergy & Precautions, H&P , Patient's Chart, lab work & pertinent test results  Airway Mallampati: II TM Distance: >3 FB Neck ROM: full    Dental   Pulmonary  breath sounds clear to auscultation        Cardiovascular Rhythm:regular Rate:Normal     Neuro/Psych    GI/Hepatic   Endo/Other    Renal/GU      Musculoskeletal   Abdominal   Peds  Hematology   Anesthesia Other Findings Thrombocytopenia  Reproductive/Obstetrics (+) Pregnancy                           Anesthesia Physical Anesthesia Plan  ASA: III  Anesthesia Plan: Epidural   Post-op Pain Management:    Induction:   Airway Management Planned:   Additional Equipment:   Intra-op Plan:   Post-operative Plan:   Informed Consent: I have reviewed the patients History and Physical, chart, labs and discussed the procedure including the risks, benefits and alternatives for the proposed anesthesia with the patient or authorized representative who has indicated his/her understanding and acceptance.     Plan Discussed with:   Anesthesia Plan Comments:         Anesthesia Quick Evaluation

## 2013-04-12 NOTE — Anesthesia Procedure Notes (Signed)
Epidural Patient location during procedure: OB Start time: 04/12/2013 9:00 AM  Staffing Anesthesiologist: Brayton CavesJACKSON, Omya Winfield Performed by: anesthesiologist   Preanesthetic Checklist Completed: patient identified, site marked, surgical consent, pre-op evaluation, timeout performed, IV checked, risks and benefits discussed and monitors and equipment checked  Epidural Patient position: sitting Prep: site prepped and draped and DuraPrep Patient monitoring: continuous pulse ox and blood pressure Approach: midline Injection technique: LOR air  Needle:  Needle type: Tuohy  Needle gauge: 17 G Needle length: 9 cm and 9 Needle insertion depth: 4 cm Catheter type: closed end flexible Catheter size: 19 Gauge Catheter at skin depth: 10 cm Test dose: negative  Assessment Events: blood not aspirated, injection not painful, no injection resistance, negative IV test and no paresthesia  Additional Notes Patient identified.  Risk benefits discussed including failed block, incomplete pain control, headache, nerve damage, paralysis, blood pressure changes, nausea, vomiting, reactions to medication both toxic or allergic, and postpartum back pain.  Patient expressed understanding and wished to proceed.  All questions were answered.  Sterile technique used throughout procedure and epidural site dressed with sterile barrier dressing. No paresthesia or other complications noted.The patient did not experience any signs of intravascular injection such as tinnitus or metallic taste in mouth nor signs of intrathecal spread such as rapid motor block. Please see nursing notes for vital signs.

## 2013-04-12 NOTE — Progress Notes (Signed)
Hannah DickensMaria L Orozco is a 33 y.o. G1P0 at 1825w0d by LMP admitted for induction of labor due to Spontaneous rupture of membranes  Subjective:  Had episode of emesis and is currently nauseous. Feeling sleepy.   Objective: BP 118/59  Pulse 112  Temp(Src) 99.4 F (37.4 C) (Oral)  Resp 16  Ht 4' 8.5" (1.435 m)  Wt 118 lb (53.524 kg)  BMI 25.99 kg/m2  SpO2 97%  LMP 07/13/2012   Total I/O In: 400 [P.O.:300; IV Piggyback:100] Out: 0   FHT:  FHR: 140 bpm, variability: minimal ,  accelerations:  Present,  decelerations:  Absent UC:   regular, every 3-4 minutes SVE:   Dilation: 8.5 Effacement (%): 80 Station: 0 Exam by:: Nevada Craneonna Herr RN  Gen: alert and oriented x3, sleepy Pulm: CTA bilaterally, normal work of breathing Neuro: +2 biceps and brachioradialis reflexes bilaterally Labs: Lab Results  Component Value Date   WBC 15.5* 04/12/2013   HGB 12.7 04/12/2013   HCT 36.9 04/12/2013   MCV 92.9 04/12/2013   PLT 93* 04/12/2013    Assessment / Plan: Induction of labor due to PROM. S/p cytotec x2, foley bulb. Pitocin started.   Labor: pitocin recently started. IUPC placed to monitor contraction strength.  Preeclampsia:  on magnesium sulfate, BP's stable Fetal Wellbeing:  Category II variability fluctuates between minimal and moderate. Likely from magnesium Pain Control:  Epidural I/D:  GBS neg Anticipated MOD:  NSVD  Marena ChancyLOSQ, Micharl Helmes 04/12/2013, 2:24 PM Family Medicine Resident, PGY-3

## 2013-04-12 NOTE — Progress Notes (Signed)
Waldemar DickensMaria L Rios-Leon is a 33 y.o. G1P0 at 1785w0d by LMP admitted for induction of labor due to spontaneous membrane rupture.  Subjective: No pain. Still feeling sleepy.   Objective: BP 132/71  Pulse 120  Temp(Src) 99.4 F (37.4 C) (Axillary)  Resp 20  Ht 4' 8.5" (1.435 m)  Wt 118 lb (53.524 kg)  BMI 25.99 kg/m2  SpO2 97%  LMP 07/13/2012   Total I/O In: 560 [P.O.:460; IV Piggyback:100] Out: 220 [Urine:220]  FHT:  FHR: 145 bpm, variability: minimal ,  accelerations:  Present,  decelerations:  Absent:  UC:   regular, every 3 minutes SVE:   Dilation: Lip/rim Effacement (%): 90 Station: +1 Exam by:: Dr Gwenlyn SaranLosq  General: sleepy but interacts with conversation when prompted, alert and oriented x3 Pulm: clear to auscultation bilaterally on anterior exam Extr: no edema Neuro: 2+ bicipital and brachioradialis bilaterally Labs: Lab Results  Component Value Date   WBC 15.5* 04/12/2013   HGB 12.7 04/12/2013   HCT 36.9 04/12/2013   MCV 92.9 04/12/2013   PLT 93* 04/12/2013    Assessment / Plan: Induction of labor due to membrane rupture. S/p cytotec x2. Foley bulb placed at 6:30am. Pitocin then started.   Labor: Progressing on Pitocin, will continue to titrate. IUPC in place. MVU adequate Preeclampsia:  on magnesium sulfate, no evidence of toxicity Fetal Wellbeing:  Category II Pain Control:  Epidural I/D:  n/a, GBS positive Anticipated MOD:  NSVD  Arrietty Dercole 04/12/2013, 5:02 PM Family Medicine Resident, PGY-3

## 2013-04-12 NOTE — Progress Notes (Signed)
I have seen and examined this patient and I agree with the above. Hannah Orozco 1:30 AM 04/12/2013

## 2013-04-12 NOTE — Progress Notes (Addendum)
Hannah DickensMaria L Orozco is a 33 y.o. G1P0 at 2458w0d by LMP admitted for rupture of membranes  Subjective: Feeling sleepy. Denies any pain with epidural. Concerned that legs are feeling numb.   Objective: BP 134/54  Pulse 117  Temp(Src) 98.7 F (37.1 C) (Axillary)  Resp 16  Ht 4' 8.5" (1.435 m)  Wt 118 lb (53.524 kg)  BMI 25.99 kg/m2  SpO2 98%  LMP 07/13/2012   Total I/O In: 280 [P.O.:180; IV Piggyback:100] Out: 0   FHT:  FHR: 150 bpm, variability: minimal,  accelerations:  Present,  decelerations:  Absent UC:   irregular, every 2-3 minutes SVE:   Dilation: 8.5 Effacement (%): 80 Station: 0 Exam by:: Dr Durward FortesLusk  General: alert and oriented x3, drowsy but coherent in conversation CV: S1S2, regular rhythm, tachycardic to 110 Pulm: normal work of breathing, clear on anterior exam Neuro: +2 bicipital and brachioradialis reflexes bilaterally  Labs: Lab Results  Component Value Date   WBC 15.5* 04/12/2013   HGB 12.7 04/12/2013   HCT 36.9 04/12/2013   MCV 92.9 04/12/2013   PLT 93* 04/12/2013    Assessment / Plan: Induction of labor due to membrane rupture. S/p cytotec x2. Foley bulb placed at 6:30am.   Labor: progressing without pitocin. Forebag ruptured at 11:45am. meconium stained fluid.  Preeclampsia:  on magnesium sulfate. Drowsy but no evidence of toxicity Fetal Wellbeing:  Category II, minimal variability likely from recent IV fentanyl and IV Mag Pain Control:  Epidural I/D:   GBS negative Anticipated MOD:  NSVD  Marena ChancyLOSQ, Seab Axel 04/12/2013, 11:53 AM Family Medicine Resident, PGY-3

## 2013-04-12 NOTE — Progress Notes (Signed)
Hannah DickensMaria L Orozco is a 33 y.o. G1P0 at 7439w0d by LMP admitted for rupture of membranes  Subjective:   Objective: BP 128/68  Pulse 114  Temp(Src) 98.9 F (37.2 C) (Axillary)  Resp 20  Ht 4' 8.5" (1.435 m)  Wt 118 lb (53.524 kg)  BMI 25.99 kg/m2  SpO2 95%  LMP 07/13/2012 I/O last 3 completed shifts: In: 1000 [P.O.:900; IV Piggyback:100] Out: 400 [Urine:400]    FHT:  FHR: 130 bpm, variability: moderate,  accelerations:  Present,  decelerations:  Absent UC:   regular, every 5-6 minutes SVE:   Dilation: Lip/rim (Rim on pt"s R side) Effacement (%): 90 Station: +1 Exam by:: Dr Gwenlyn SaranLosq  Labs: Lab Results  Component Value Date   WBC 15.5* 04/12/2013   HGB 12.7 04/12/2013   HCT 36.9 04/12/2013   MCV 92.9 04/12/2013   PLT 93* 04/12/2013    Assessment / Plan: Induction of labor due to membrane rupture,  progressing well on pitocin  Labor: will increase pitocin dose to get contractions closer together. anterior cervical lip hindering progression. WIll try to reduce it once contractions closer together Preeclampsia:  on magnesium sulfate Fetal Wellbeing:  Category I Pain Control:  Epidural I/D:  n/a GBS negative Anticipated MOD:  NSVD  Hannah Orozco 04/12/2013, 7:32 PM Marena ChancyStephanie Jequan Shahin, PGY-3

## 2013-04-12 NOTE — Progress Notes (Signed)
Hannah Orozco Hannah Orozco is a 33 y.o. G1P0 at 7054w0d admitted for PROM  Subjective: Complaining of pain, asking for a c-section, rec'd epidural with plat >90. Pt meets criteria for severe Prex based on BP and plt.  Objective: BP 151/96  Pulse 100  Temp(Src) 98.5 F (36.9 C) (Axillary)  Resp 20  Ht 4' 8.5" (1.435 m)  Wt 53.524 kg (118 lb)  BMI 25.99 kg/m2  SpO2 99%  LMP 07/13/2012      FHT:  FHR: 130 bpm, variability: moderate,  accelerations:  Present,  decelerations:  Absent UC:   regular, every 1-2 minutes SVE:   Dilation: 5 Effacement (%): 70 Station: 0 Exam by:: D herr rn  Labs: Lab Results  Component Value Date   WBC 15.5* 04/12/2013   HGB 12.7 04/12/2013   HCT 36.9 04/12/2013   MCV 92.9 04/12/2013   PLT 93* 04/12/2013    Assessment / Plan: Induction due to prom. S/p cytotec x2. Foley bulb placed at 6:30.  Labor: Progressing normally, s/p FB, contracting frequently.  Preeclampsia:  Meets criteria of severe preX based on plt and BPs, starting mag 4/2 now and tx Bp 160/105 with labetalol PRN Fetal Wellbeing:  Category I Pain Control:  Improved thrombocytopenia, epidural placed, recehck plt prior to removal I/D:  n/a GBS neg Anticipated MOD:  NSVD, continuity delivery of dr. Gwenlyn Orozco. Will notify once >6cm.  Hannah Orozco, Hannah Orozco 04/12/2013, 9:18 AM

## 2013-04-12 NOTE — Progress Notes (Signed)
Hannah DickensMaria L Orozco is a 33 y.o. G1P0 at 6438w0d admitted for PROM  Subjective: Complaining of pain, asking for a c-section, unable to get epidural due to thrombocytopenia. Very drowsy from pain medicine.   Objective: BP 135/99  Pulse 110  Temp(Src) 98.6 F (37 C) (Axillary)  Resp 18  Ht 4' 8.5" (1.435 m)  Wt 53.524 kg (118 lb)  BMI 25.99 kg/m2  SpO2 99%  LMP 07/13/2012      FHT:  FHR: 130 bpm, variability: moderate,  accelerations:  Present,  decelerations:  Absent UC:   regular, every 1-2 minutes SVE:   Dilation: 2 Effacement (%): 90 Station: 0 Exam by:: Dr. Richarda BladeAdamo  Labs: Lab Results  Component Value Date   WBC 12.7* 04/12/2013   HGB 12.2 04/12/2013   HCT 35.1* 04/12/2013   MCV 93.1 04/12/2013   PLT 88* 04/12/2013    Assessment / Plan: Induction due to prom. S/p cytotec x2. Foley bulb placed at 6:30.  Labor: Progressing normally, foley bulb in place, contracting often. Preeclampsia:  no signs or symptoms of toxicity Fetal Wellbeing:  Category I Pain Control:  Fentanyl and not tolerating pain well, unable to get epidural 2/2 thrombocytopenia I/D:  n/a Anticipated MOD:  NSVD  Hannah Orozco, Hannah Orozco 04/12/2013, 6:59 AM

## 2013-04-13 ENCOUNTER — Encounter (HOSPITAL_COMMUNITY): Payer: Self-pay | Admitting: *Deleted

## 2013-04-13 DIAGNOSIS — D696 Thrombocytopenia, unspecified: Secondary | ICD-10-CM

## 2013-04-13 DIAGNOSIS — O4290 Premature rupture of membranes, unspecified as to length of time between rupture and onset of labor, unspecified weeks of gestation: Secondary | ICD-10-CM

## 2013-04-13 LAB — COMPREHENSIVE METABOLIC PANEL
ALBUMIN: 1.9 g/dL — AB (ref 3.5–5.2)
ALT: 11 U/L (ref 0–35)
AST: 35 U/L (ref 0–37)
Alkaline Phosphatase: 202 U/L — ABNORMAL HIGH (ref 39–117)
BUN: 14 mg/dL (ref 6–23)
CO2: 20 mEq/L (ref 19–32)
Calcium: 8.4 mg/dL (ref 8.4–10.5)
Chloride: 94 mEq/L — ABNORMAL LOW (ref 96–112)
Creatinine, Ser: 1.21 mg/dL — ABNORMAL HIGH (ref 0.50–1.10)
GFR, EST AFRICAN AMERICAN: 67 mL/min — AB (ref >90)
GFR, EST NON AFRICAN AMERICAN: 58 mL/min — AB (ref >90)
Glucose, Bld: 261 mg/dL — ABNORMAL HIGH (ref 70–99)
Potassium: 3.4 mEq/L — ABNORMAL LOW (ref 3.7–5.3)
Sodium: 132 mEq/L — ABNORMAL LOW (ref 137–147)
Total Bilirubin: 0.4 mg/dL (ref 0.3–1.2)
Total Protein: 5 g/dL — ABNORMAL LOW (ref 6.0–8.3)

## 2013-04-13 LAB — CBC
HCT: 31.2 % — ABNORMAL LOW (ref 36.0–46.0)
HEMATOCRIT: 33 % — AB (ref 36.0–46.0)
Hemoglobin: 11.7 g/dL — ABNORMAL LOW (ref 12.0–15.0)
Hemoglobin: 10.8 g/dL — ABNORMAL LOW (ref 12.0–15.0)
MCH: 32.7 pg (ref 26.0–34.0)
MCH: 32.1 pg (ref 26.0–34.0)
MCHC: 35.5 g/dL (ref 30.0–36.0)
MCHC: 34.6 g/dL (ref 30.0–36.0)
MCV: 92.2 fL (ref 78.0–100.0)
MCV: 92.9 fL (ref 78.0–100.0)
PLATELETS: 91 10*3/uL — AB (ref 150–400)
Platelets: 90 10*3/uL — ABNORMAL LOW (ref 150–400)
RBC: 3.58 MIL/uL — ABNORMAL LOW (ref 3.87–5.11)
RBC: 3.36 MIL/uL — AB (ref 3.87–5.11)
RDW: 13.5 % (ref 11.5–15.5)
RDW: 13.6 % (ref 11.5–15.5)
WBC: 26.5 10*3/uL — ABNORMAL HIGH (ref 4.0–10.5)
WBC: 26.9 10*3/uL — ABNORMAL HIGH (ref 4.0–10.5)

## 2013-04-13 LAB — MAGNESIUM
MAGNESIUM: 6.4 mg/dL — AB (ref 1.5–2.5)
Magnesium: 8.3 mg/dL (ref 1.5–2.5)

## 2013-04-13 LAB — MRSA PCR SCREENING: MRSA by PCR: NEGATIVE

## 2013-04-13 MED ORDER — MAGNESIUM SULFATE 40 G IN LACTATED RINGERS - SIMPLE
2.0000 g/h | INTRAVENOUS | Status: DC
  Filled 2013-04-13: qty 500

## 2013-04-13 MED ORDER — FAMOTIDINE 20 MG PO TABS
20.0000 mg | ORAL_TABLET | Freq: Two times a day (BID) | ORAL | Status: DC
  Administered 2013-04-13 – 2013-04-15 (×5): 20 mg via ORAL
  Filled 2013-04-13 (×5): qty 1

## 2013-04-13 MED ORDER — BENZOCAINE-MENTHOL 20-0.5 % EX AERO
1.0000 "application " | INHALATION_SPRAY | CUTANEOUS | Status: DC | PRN
  Administered 2013-04-13: 1 via TOPICAL
  Filled 2013-04-13: qty 56

## 2013-04-13 MED ORDER — LACTATED RINGERS IV SOLN: INTRAVENOUS | Status: DC

## 2013-04-13 MED ORDER — PRENATAL MULTIVITAMIN CH
1.0000 | ORAL_TABLET | Freq: Every day | ORAL | Status: DC
  Administered 2013-04-13 – 2013-04-15 (×3): 1 via ORAL
  Filled 2013-04-13 (×3): qty 1

## 2013-04-13 MED ORDER — MAGNESIUM SULFATE 40 G IN LACTATED RINGERS - SIMPLE
1.0000 g/h | INTRAVENOUS | Status: AC
  Filled 2013-04-13: qty 500

## 2013-04-13 MED ORDER — OXYCODONE-ACETAMINOPHEN 5-325 MG PO TABS: 1.0000 | ORAL_TABLET | ORAL | Status: DC | PRN

## 2013-04-13 MED ORDER — TETANUS-DIPHTH-ACELL PERTUSSIS 5-2.5-18.5 LF-MCG/0.5 IM SUSP
0.5000 mL | Freq: Once | INTRAMUSCULAR | Status: AC
  Administered 2013-04-14: 0.5 mL via INTRAMUSCULAR
  Filled 2013-04-13: qty 0.5

## 2013-04-13 MED ORDER — SODIUM CHLORIDE 0.9 % IV SOLN
INTRAVENOUS | Status: DC
  Administered 2013-04-13: 11:00:00 via INTRAVENOUS

## 2013-04-13 MED ORDER — SODIUM CHLORIDE 0.45 % IV SOLN
INTRAVENOUS | Status: DC
  Administered 2013-04-13: 04:00:00 via INTRAVENOUS

## 2013-04-13 MED ORDER — IBUPROFEN 600 MG PO TABS: 600.0000 mg | ORAL_TABLET | Freq: Four times a day (QID) | ORAL | Status: DC

## 2013-04-13 MED ORDER — SENNOSIDES-DOCUSATE SODIUM 8.6-50 MG PO TABS
2.0000 | ORAL_TABLET | ORAL | Status: DC
  Administered 2013-04-13 – 2013-04-15 (×2): 2 via ORAL
  Filled 2013-04-13 (×2): qty 2

## 2013-04-13 MED ORDER — SODIUM CHLORIDE 0.9 % IJ SOLN: 3.0000 mL | INTRAMUSCULAR | Status: DC | PRN

## 2013-04-13 MED ORDER — DIBUCAINE 1 % RE OINT: 1.0000 "application " | TOPICAL_OINTMENT | RECTAL | Status: DC | PRN

## 2013-04-13 MED ORDER — IBUPROFEN 600 MG PO TABS
600.0000 mg | ORAL_TABLET | Freq: Four times a day (QID) | ORAL | Status: DC | PRN
  Administered 2013-04-13 – 2013-04-15 (×4): 600 mg via ORAL
  Filled 2013-04-13 (×5): qty 1

## 2013-04-13 MED ORDER — DIPHENHYDRAMINE HCL 25 MG PO CAPS
25.0000 mg | ORAL_CAPSULE | Freq: Four times a day (QID) | ORAL | Status: DC | PRN
Start: 2013-04-13 — End: 2013-04-15

## 2013-04-13 MED ORDER — MAGNESIUM SULFATE 40 G IN LACTATED RINGERS - SIMPLE
2.0000 g/h | INTRAVENOUS | Status: DC
  Administered 2013-04-13: 2 g/h via INTRAVENOUS
  Filled 2013-04-13: qty 500

## 2013-04-13 MED ORDER — SIMETHICONE 80 MG PO CHEW: 80.0000 mg | CHEWABLE_TABLET | ORAL | Status: DC | PRN

## 2013-04-13 MED ORDER — LANOLIN HYDROUS EX OINT: TOPICAL_OINTMENT | CUTANEOUS | Status: DC | PRN

## 2013-04-13 MED ORDER — ONDANSETRON HCL 4 MG/2ML IJ SOLN
4.0000 mg | INTRAMUSCULAR | Status: DC | PRN
Start: 2013-04-13 — End: 2013-04-15

## 2013-04-13 MED ORDER — ONDANSETRON HCL 4 MG PO TABS: 4.0000 mg | ORAL_TABLET | ORAL | Status: DC | PRN

## 2013-04-13 MED ORDER — WITCH HAZEL-GLYCERIN EX PADS: 1.0000 "application " | MEDICATED_PAD | CUTANEOUS | Status: DC | PRN

## 2013-04-13 MED ORDER — ACETAMINOPHEN 500 MG PO TABS: 1000.0000 mg | ORAL_TABLET | Freq: Four times a day (QID) | ORAL | Status: DC | PRN

## 2013-04-13 MED ORDER — ZOLPIDEM TARTRATE 5 MG PO TABS
5.0000 mg | ORAL_TABLET | Freq: Every evening | ORAL | Status: DC | PRN
Start: 2013-04-13 — End: 2013-04-15

## 2013-04-13 MED ORDER — SODIUM CHLORIDE 0.9 % IJ SOLN
3.0000 mL | Freq: Two times a day (BID) | INTRAMUSCULAR | Status: DC
  Administered 2013-04-13 – 2013-04-14 (×2): 3 mL via INTRAVENOUS

## 2013-04-13 MED ORDER — DIPHENHYDRAMINE HCL 25 MG PO CAPS: 25.0000 mg | ORAL_CAPSULE | Freq: Four times a day (QID) | ORAL | Status: DC | PRN

## 2013-04-13 NOTE — Progress Notes (Signed)
04/13/13 1059  Provider Notification  Reason for Communication Review Case (critical magnesium level=6.4 rec'd, Na+, K+, & UOP)  [facility]  Provider Role Attending physician  Method of Communication Call  Response See orders

## 2013-04-13 NOTE — Progress Notes (Signed)
UR chart review completed.  

## 2013-04-13 NOTE — Progress Notes (Signed)
Post Partum Day 1 Subjective: Patient is Spanish-speaking only, Spanish interpreter present for this encounter. Complains of feeling dizzy, having blurry vision and fatigue.  She is tolerating PO and trying to breastfeed, but also wants to bottle feed.  Baby doing well, in the room with Mom.  On magnesium sulfate until 2300 tonight. Denies any HA, scotomata, RUQ pain.  Objective: Blood pressure 121/83, pulse 96, temperature 98.4 F (36.9 C), temperature source Oral, resp. rate 18, height 4\' 8"  (1.422 m), weight 117 lb 3.2 oz (53.162 kg), last menstrual period 07/13/2012, SpO2 100.00%, unknown if currently breastfeeding. Temp:  [98.4 F (36.9 C)-99.4 F (37.4 C)] 98.4 F (36.9 C) (01/30 0130) Pulse Rate:  [87-148] 96 (01/30 0700) Resp:  [16-24] 18 (01/30 0700) BP: (103-160)/(54-126) 121/83 mmHg (01/30 0700) SpO2:  [77 %-100 %] 100 % (01/30 0700) Weight:  [117 lb 3.2 oz (53.162 kg)] 117 lb 3.2 oz (53.162 kg) (01/30 0130)  Physical Exam:  General: alert and no distress Lungs: CTAB Heart: RRR Abdomen: soft, NT, ND Uterine Fundus: firm Lochia: appropriate Ext: No C/C/E, 2+ DTRs DVT Evaluation: No evidence of DVT seen on physical exam. Negative Homan's sign.   Results for orders placed during the hospital encounter of 04/11/13 (from the past 24 hour(s))  CBC     Status: Abnormal   Collection Time    04/12/13  8:05 AM      Result Value Range   WBC 15.5 (*) 4.0 - 10.5 K/uL   RBC 3.97  3.87 - 5.11 MIL/uL   Hemoglobin 12.7  12.0 - 15.0 g/dL   HCT 16.1  09.6 - 04.5 %   MCV 92.9  78.0 - 100.0 fL   MCH 32.0  26.0 - 34.0 pg   MCHC 34.4  30.0 - 36.0 g/dL   RDW 40.9  81.1 - 91.4 %   Platelets 93 (*) 150 - 400 K/uL  COMPREHENSIVE METABOLIC PANEL     Status: Abnormal   Collection Time    04/12/13  8:05 AM      Result Value Range   Sodium 139  137 - 147 mEq/L   Potassium 3.8  3.7 - 5.3 mEq/L   Chloride 101  96 - 112 mEq/L   CO2 21  19 - 32 mEq/L   Glucose, Bld 111 (*) 70 - 99 mg/dL   BUN 8  6 - 23 mg/dL   Creatinine, Ser 7.82  0.50 - 1.10 mg/dL   Calcium 9.0  8.4 - 95.6 mg/dL   Total Protein 6.6  6.0 - 8.3 g/dL   Albumin 2.8 (*) 3.5 - 5.2 g/dL   AST 22  0 - 37 U/L   ALT 10  0 - 35 U/L   Alkaline Phosphatase 289 (*) 39 - 117 U/L   Total Bilirubin 0.4  0.3 - 1.2 mg/dL   GFR calc non Af Amer >90  >90 mL/min   GFR calc Af Amer >90  >90 mL/min  URIC ACID     Status: None   Collection Time    04/12/13  8:05 AM      Result Value Range   Uric Acid, Serum 4.2  2.4 - 7.0 mg/dL  LACTATE DEHYDROGENASE     Status: None   Collection Time    04/12/13  8:05 AM      Result Value Range   LDH 209  94 - 250 U/L  CBC     Status: Abnormal   Collection Time    04/12/13  9:15  PM      Result Value Range   WBC 24.1 (*) 4.0 - 10.5 K/uL   RBC 3.72 (*) 3.87 - 5.11 MIL/uL   Hemoglobin 11.9 (*) 12.0 - 15.0 g/dL   HCT 16.134.7 (*) 09.636.0 - 04.546.0 %   MCV 93.3  78.0 - 100.0 fL   MCH 32.0  26.0 - 34.0 pg   MCHC 34.3  30.0 - 36.0 g/dL   RDW 40.913.6  81.111.5 - 91.415.5 %   Platelets 97 (*) 150 - 400 K/uL  MRSA PCR SCREENING     Status: None   Collection Time    04/13/13  1:50 AM      Result Value Range   MRSA by PCR NEGATIVE  NEGATIVE  CBC     Status: Abnormal   Collection Time    04/13/13  2:30 AM      Result Value Range   WBC 26.5 (*) 4.0 - 10.5 K/uL   RBC 3.58 (*) 3.87 - 5.11 MIL/uL   Hemoglobin 11.7 (*) 12.0 - 15.0 g/dL   HCT 78.233.0 (*) 95.636.0 - 21.346.0 %   MCV 92.2  78.0 - 100.0 fL   MCH 32.7  26.0 - 34.0 pg   MCHC 35.5  30.0 - 36.0 g/dL   RDW 08.613.5  57.811.5 - 46.915.5 %   Platelets 90 (*) 150 - 400 K/uL  CBC     Status: Abnormal   Collection Time    04/13/13  5:05 AM      Result Value Range   WBC 26.9 (*) 4.0 - 10.5 K/uL   RBC 3.36 (*) 3.87 - 5.11 MIL/uL   Hemoglobin 10.8 (*) 12.0 - 15.0 g/dL   HCT 62.931.2 (*) 52.836.0 - 41.346.0 %   MCV 92.9  78.0 - 100.0 fL   MCH 32.1  26.0 - 34.0 pg   MCHC 34.6  30.0 - 36.0 g/dL   RDW 24.413.6  01.011.5 - 27.215.5 %   Platelets 91 (*) 150 - 400 K/uL    Assessment/Plan: No  signs of worsening symptoms, stable BP.  Patient assured her symptoms were 2/2 magnesium sulfate.  Will follow up CMET and magnesium levels.  Stable platelets and hemoglobin. Breast and bottle feeding Contraception: undecided Continue magnesium sulfate for 24 hr postpartum Routine postpartum care   LOS: 2 days    Tereso NewcomerANYANWU,Makeda Peeks A, M.D. 04/13/2013, 7:32 AM

## 2013-04-13 NOTE — Progress Notes (Signed)
Patient was referred for history of depression/anxiety.  * Referral screened out by Clinical Social Worker because none of the following criteria appear to apply:  ~ History of anxiety/depression during this pregnancy, or of post-partum depression.  ~ Diagnosis of anxiety and/or depression within last 3 years, per pt.  ~ History of depression due to pregnancy loss/loss of child  OR  * Patient's symptoms currently being treated with medication and/or therapy.  Please contact the Clinical Social Worker if needs arise, or by the patient's request.

## 2013-04-13 NOTE — Lactation Note (Signed)
This note was copied from the chart of Hannah Orozco. Lactation Consultation Note Inital consult with mom and baby, now 13 hours post partum. The baby has been sleepy, and has not been breast feeding for mom. I showed mom how to hand express, and collected 1 ml of of colostrum, which I cup fed to the baby. I then assisted mom with latching, and he latched well with strong suckles and visible swallows, for 15 minutes. Breast feeding teaching and the avoidance of formula,done with the Spanish interpreter.  Mom knows to try skin to skin and hand expression if baby not cuing, and to have mom's nurse for l;actation if she again has trouble getting him to latch. Lactation services also reviewed with mom.  Patient Name: Hannah Orozco UUVOZ'DToday's Date: 04/13/2013 Reason for consult: Initial assessment   Maternal Data Formula Feeding for Exclusion: Yes Reason for exclusion: Mother's choice to formula and breast feed on admission;Admission to Intensive Care Unit (ICU) post-partum Infant to breast within first hour of birth: Yes Has patient been taught Hand Expression?: Yes Does the patient have breastfeeding experience prior to this delivery?: No  Feeding Feeding Type: Breast Milk Nipple Type: Slow - flow Length of feed: 0 min (baby too tired)  LATCH Score/Interventions Latch: Repeated attempts needed to sustain latch, nipple held in mouth throughout feeding, stimulation needed to elicit sucking reflex. Intervention(s): Adjust position;Assist with latch;Breast massage;Breast compression  Audible Swallowing: A few with stimulation Intervention(s): Hand expression  Type of Nipple: Everted at rest and after stimulation  Comfort (Breast/Nipple): Soft / non-tender     Hold (Positioning): Assistance needed to correctly position infant at breast and maintain latch. Intervention(s): Breastfeeding basics reviewed;Support Pillows;Position options;Skin to skin  LATCH Score: 7  Lactation Tools  Discussed/Used WIC Program: Yes   Consult Status Consult Status: Follow-up Date: 04/13/13 Follow-up type: In-patient    [name], [name] 04/13/2013, 12:55 PM

## 2013-04-13 NOTE — Progress Notes (Signed)
04/13/13 0815  Provider Notification  Reason for Communication Review Case (critical magnesium level=8.3)  Provider Name Anyanwu  Provider Role Attending physician  Method of Communication Call  Response See orders (hold magnesium x 1 hr)

## 2013-04-13 NOTE — Anesthesia Postprocedure Evaluation (Signed)
Anesthesia Post Note  Patient: Hannah DickensMaria L Orozco  Procedure(s) Performed: * No procedures listed *  Anesthesia type: Epidural  Patient location: Mother/Baby  Post pain: Pain level controlled  Post assessment: Post-op Vital signs reviewed  Last Vitals:  Filed Vitals:   04/13/13 0915  BP:   Pulse: 94  Temp:   Resp: 16    Post vital signs: Reviewed  Level of consciousness:alert  Complications: No apparent anesthesia complications

## 2013-04-14 LAB — BASIC METABOLIC PANEL
BUN: 18 mg/dL (ref 6–23)
CALCIUM: 8.7 mg/dL (ref 8.4–10.5)
CO2: 27 mEq/L (ref 19–32)
Chloride: 101 mEq/L (ref 96–112)
Creatinine, Ser: 0.9 mg/dL (ref 0.50–1.10)
GFR calc Af Amer: >90 mL/min (ref >90)
GFR calc non Af Amer: 83 mL/min — ABNORMAL LOW (ref >90)
Glucose, Bld: 88 mg/dL (ref 70–99)
Potassium: 3.5 mEq/L — ABNORMAL LOW (ref 3.7–5.3)
Sodium: 138 mEq/L (ref 137–147)

## 2013-04-14 NOTE — Progress Notes (Signed)
Post Partum Day 1 Subjective: no complaints, up ad lib, voiding, tolerating PO and + flatus  Objective: Blood pressure 96/48, pulse 80, temperature 98.3 F (36.8 C), temperature source Oral, resp. rate 18, height 4\' 8"  (1.422 m), weight 117 lb 3.2 oz (53.162 kg), last menstrual period 07/13/2012, SpO2 98.00%, unknown if currently breastfeeding.  Physical Exam:  General: alert, cooperative and no distress Lochia: appropriate Uterine Fundus: firm Incision: no incision DVT Evaluation: No evidence of DVT seen on physical exam. Negative Homan's sign.   Recent Labs  04/13/13 0230 04/13/13 0505  HGB 11.7* 10.8*  HCT 33.0* 31.2*   CMP     Component Value Date/Time   NA 138 04/14/2013 0530   K 3.5* 04/14/2013 0530   CL 101 04/14/2013 0530   CO2 27 04/14/2013 0530   GLUCOSE 88 04/14/2013 0530   BUN 18 04/14/2013 0530   CREATININE 0.90 04/14/2013 0530   CREATININE 0.43* 02/05/2013 0854   CALCIUM 8.7 04/14/2013 0530   PROT 5.0* 04/13/2013 0505   ALBUMIN 1.9* 04/13/2013 0505   AST 35 04/13/2013 0505   ALT 11 04/13/2013 0505   ALKPHOS 202* 04/13/2013 0505   BILITOT 0.4 04/13/2013 0505   GFRNONAA 83* 04/14/2013 0530   GFRAA >90 04/14/2013 0530      Assessment/Plan: Plan for discharge tomorrow and Breastfeeding Creatinine improved Normotensive Transfer to floor   LOS: 3 days   LEGGETT,KELLY H. 04/14/2013, 6:35 AM

## 2013-04-15 LAB — COMPREHENSIVE METABOLIC PANEL
ALT: 15 U/L (ref 0–35)
AST: 29 U/L (ref 0–37)
Albumin: 2 g/dL — ABNORMAL LOW (ref 3.5–5.2)
Alkaline Phosphatase: 151 U/L — ABNORMAL HIGH (ref 39–117)
BUN: 15 mg/dL (ref 6–23)
CALCIUM: 8.9 mg/dL (ref 8.4–10.5)
CO2: 26 mEq/L (ref 19–32)
CREATININE: 0.7 mg/dL (ref 0.50–1.10)
Chloride: 102 mEq/L (ref 96–112)
GFR calc non Af Amer: >90 mL/min (ref >90)
GLUCOSE: 80 mg/dL (ref 70–99)
Potassium: 3.9 mEq/L (ref 3.7–5.3)
SODIUM: 138 meq/L (ref 137–147)
TOTAL PROTEIN: 5.4 g/dL — AB (ref 6.0–8.3)
Total Bilirubin: 0.3 mg/dL (ref 0.3–1.2)

## 2013-04-15 MED ORDER — IBUPROFEN 600 MG PO TABS: 600.0000 mg | ORAL_TABLET | Freq: Four times a day (QID) | ORAL | Status: DC | PRN

## 2013-04-15 NOTE — Discharge Summary (Signed)
Obstetric Discharge Summary Reason for Admission: induction of labor Prenatal Procedures: none Intrapartum Procedures: spontaneous vaginal delivery Postpartum Procedures: none Complications-Operative and Postpartum: none Hemoglobin  Date Value Range Status  04/13/2013 10.8* 12.0 - 15.0 g/dL Final     HCT  Date Value Range Status  04/13/2013 31.2* 36.0 - 46.0 % Final    Physical Exam:  General: alert, cooperative and no distress Lochia: appropriate Uterine Fundus: firm Incision: no significant drainage, no significant erythema DVT Evaluation: No evidence of DVT seen on physical exam. No cords or calf tenderness. No significant calf/ankle edema.  Discharge Diagnoses: Term [facility] course: Pt was admitted for PROM. Induction was started. She later developed elevated pressures in the mild range, these resolved after she received her epidural. Given her thrombocytopenia to 88, she was diagnosed with severe pre-eclampsia. Pt cr elevated PP as well meeting criteria for Preeclampsia. Her pressures remained normal postpartum, UOP appropriate and Cr improved PP with platelets stable at 90. Discharge Information: Date: 04/15/2013 Activity: pelvic rest Diet: routine Medications: PNV and Ibuprofen Condition: stable Instructions: refer to practice specific booklet Discharge to: home Contraception: patient remains undecided, counseled about LARC and others  Follow-up Information   Follow up with Marena Chancy, MD. Schedule an appointment as soon as possible for a visit in 4 weeks.   Specialty:  Family Medicine   Contact information:   [address] [place] [address] [phone]      Results for AMANDY, CHUBBUCK (MRN [id number]) as of 04/15/2013 11:05  Ref. Range 04/13/2013 05:05 04/13/2013 10:20 04/14/2013 05:30 04/15/2013 06:01  Sodium Latest Range: 137-147 mEq/L 132 (L)  138 138  Potassium Latest Range: 3.7-5.3 mEq/L 3.4 (L)  3.5 (L) 3.9  Chloride Latest  Range: 96-112 mEq/L 94 (L)  101 102  CO2 Latest Range: 19-32 mEq/L 20  27 26   BUN Latest Range: 6-23 mg/dL 14  18 15   Creatinine Latest Range: 0.50-1.10 mg/dL 2.13 (H)  0.86 5.78  Calcium Latest Range: 8.4-10.5 mg/dL 8.4  8.7 8.9  GFR calc non Af Amer Latest Range: >90 mL/min 58 (L)  83 (L) >90  GFR calc Af Amer Latest Range: >90 mL/min 67 (L)  >90 >90  Glucose Latest Range: 70-99 mg/dL 469 (H)  88 80  Magnesium Latest Range: 1.5-2.5 mg/dL 8.3 (HH) 6.4 (HH)    Alkaline Phosphatase Latest Range: 39-117 U/L 202 (H)   151 (H)  Albumin Latest Range: 3.5-5.2 g/dL 1.9 (L)   2.0 (L)  AST Latest Range: 0-37 U/L 35   29  ALT Latest Range: 0-35 U/L 11   15  Total Protein Latest Range: 6.0-8.3 g/dL 5.0 (L)   5.4 (L)  Total Bilirubin Latest Range: 0.3-1.2 mg/dL 0.4   0.3  Results for SUMMIT, BORCHARDT (MRN [id number]) as of 04/15/2013 11:05  Ref. Range 04/12/2013 21:15 04/13/2013 01:50 04/13/2013 02:30 04/13/2013 05:05  WBC Latest Range: 4.0-10.5 K/uL 24.1 (H)  26.5 (H) 26.9 (H)  RBC Latest Range: 3.87-5.11 MIL/uL 3.72 (L)  3.58 (L) 3.36 (L)  Hemoglobin Latest Range: 12.0-15.0 g/dL 24.4 (L)  01.0 (L) 27.2 (L)  HCT Latest Range: 36.0-46.0 % 34.7 (L)  33.0 (L) 31.2 (L)  MCV Latest Range: 78.0-100.0 fL 93.3  92.2 92.9  MCH Latest Range: 26.0-34.0 pg 32.0  32.7 32.1  MCHC Latest Range: 30.0-36.0 g/dL 53.6  64.4 03.4  RDW Latest Range: 11.5-15.5 % 13.6  13.5 13.6  Platelets Latest Range: 150-400 K/uL 97 (L)  90 (L) 91 (L)  Newborn Data: Live born female  Birth Weight: 7 lb 3.3 oz (3269 g) APGAR: 8, 9  Home with mother.  Beverely Lowdamo, Elena 04/15/2013, 7:41 AM  I spoke with and examined patient and agree with resident's note and plan of care.  Tawana ScaleMichael Ryan Arturo Sofranko, MD OB Fellow 04/15/2013 11:00 AM

## 2013-04-15 NOTE — Discharge Instructions (Signed)
Cuidados en el postparto luego de un parto vaginal  (Postpartum Care After Vaginal Delivery) Despus del parto (perodo de postparto), la estada normal en el hospital es de 24-72 horas. Si hubo problemas con el trabajo de parto o el parto, o si tiene otros problemas mdicos, es posible que Patent attorney en el hospital por ms [place].  Mientras est en el hospital, recibir [place] e instrucciones sobre cmo cuidar de usted misma y de su beb recin nacido durante el postparto.  Mientras est en el hospital:   Asegrese de decirle a las enfermeras si siente dolor o Tree surgeon, as como donde Designer, television/film set y Architect.  Si usted tuvo una incisin cerca de la vagina (episiotoma) o si ha tenido Education officer, museum, las enfermeras le pondrn hielo sobre la episiotoma o Psychiatrist. Las bolsas de hielo pueden ayudar a Dietitian y la hinchazn.  Si est amamantando, puede sentir contracciones dolorosas en el tero durante algunas semanas. Esto es normal. Las contracciones ayudan a que el tero vuelva a su tamao normal.  Es normal tener algo de sangrado despus del [place].  Durante los primeros 1-3 das despus del parto, el flujo es de color rojo y la cantidad puede ser similar a un perodo.  Es frecuente que el flujo se inicie y se Production assistant, radio.  En los primeros [place], puede eliminar algunos cogulos pequeos. Informe a las enfermeras si elimina cogulos grandes o aumenta el flujo.  No  elimine los cogulos de sangre por el inodoro antes de que la [employer] vea.  Durante los prximos 3 a [address] despus del parto, el flujo debe ser ms acuoso y rosado o Forensic psychologist.  Chancy Hurter a catorce [employer] del parto, el flujo debe ser una pequea cantidad de secrecin de color blanco amarillento.  La cantidad de flujo disminuir en las primeras semanas despus del parto. El flujo puede detenerse en 6-8 semanas. La mayora de las mujeres no tienen ms flujo a las 12 semanas  despus del [place].  Usted debe cambiar sus apsitos con frecuencia.  Lvese bien las manos con agua y jabn durante al menos 20 segundos despus de cambiar el apsito, usar el bao o antes de Nature conservation officer o Research scientist (life sciences) a su recin nacido.  Usted podr sentir como que tiene que vaciar la vejiga durante las primeras 6-8 horas despus del [place].  En caso de que sienta debilidad, mareo o [place], llame a la enfermera antes de levantarse de la cama por primera vez y antes de tomar una ducha por primera vez.  Dentro de los [employer] del parto, sus mamas pueden comenzar a estar sensibles y [place]. Esto se llama congestin. La sensibilidad en los senos por lo general desaparece dentro de las 48-72 horas despus de que ocurre la congestin. Tambin puede notar que la [place] se escapa de sus senos. Si no est amamantando no estimule sus pechos. La estimulacin de las mamas hace que sus senos produzcan ms [place].  Pasar tanto tiempo como le sea posible con el beb recin nacido es muy importante. Durante ese tiempo, usted y su beb deben sentirse cerca y conocerse uno al otro. Tener al beb en su habitacin (alojamiento conjunto) ayudar a fortalecer el vnculo con el beb recin nacido.Esto le dar tiempo para conocerlo y atenderlo de [place] cmoda.  Las hormonas se modifican despus del parto. A veces, los cambios hormonales pueden causar tristeza o ganas de llorar por un tiempo. Estos sentimientos  no deben durar ms de unos pocos das. Si duran ms que eso, debe hablar con su mdico.  Si lo desea, hable con su mdico acerca de los mtodos de planificacin familiar o mtodos anticonceptivos.  Hable con su mdico acerca de las vacunas. El mdico puede indicarle que se aplique las siguientes vacunas antes de salir del hospital:  Vacuna contra el ttanos, la difteria y la tos ferina (Tdap) o el ttanos y la difteria (Td). Es muy importante que usted y su familia (incluyendo a los abuelos) u otras  personas que cuidan al recin nacido estn al da con las vacunas Tdap o Td. Las vacunas Tdap o Td pueden ayudar a proteger al recin nacido de enfermedades.  Inmunizacin contra la rubola.  Inmunizacin contra la varicela.  Inmunizacin contra la gripe. Usted debe recibir esta vacunacin anual si no la ha recibido durante el embarazo. Document Released: 12/27/2006 Document Revised: 11/24/2011 ExitCare Patient Information 2014 ExitCare, LLC.  

## 2013-04-16 NOTE — H&P (Signed)
  Prenatal Transfer Tool  Maternal Diabetes: No Genetic Screening: Declined Maternal Ultrasounds/Referrals: Normal Fetal Ultrasounds or other Referrals:  None Maternal Substance Abuse:  No Significant Maternal Medications:  None Significant Maternal Lab Results: None

## 2013-04-19 ENCOUNTER — Ambulatory Visit (HOSPITAL_COMMUNITY): Payer: MEDICAID

## 2013-04-19 ENCOUNTER — Ambulatory Visit (HOSPITAL_COMMUNITY)
Admission: RE | Admit: 2013-04-19 | Discharge: 2013-04-19 | Disposition: A | Discharge status: Home / Self Care | Payer: Medicaid Other | Source: Ambulatory Visit | Attending: Obstetrics & Gynecology | Admitting: Obstetrics & Gynecology

## 2013-04-19 NOTE — Lactation Note (Signed)
Adult Lactation Consultation Outpatient Visit Note  Patient Name: Hannah DickensMaria L Rios-Leon(mother)  BABY: [name] Date of Birth: 10/02/1980                                DOB: [birth year] Gestational Age at Delivery: 4239                     BIRTH WEIGHT: 7-3.3 Type of Delivery: NVD                                   DISCHARGE WEIGHT: 6-12.8                                                                        WEIGHT TODAY: 7-1.5 Breastfeeding History: Frequency of Breastfeeding:every 1.5-2 hours  Length of Feeding: 15-30 minutes Voids: 6+ Stools: 6+ yellow  Supplementing / Method:Formula 60 mls/twice per day Pumping:  Type of Pump:manual   Frequency:once per day   Volume:  60 mls  Comments:    Consultation Evaluation:Mom and 461 week old baby here for feeding assessment.  Hospital interpreter here for consultation.  Mom started giving formula to baby 2 days ago because baby was fussy and she felt baby was not getting enough milk from breast.  Breasts are full.  Observed baby latch with minimal and then no assist.  Baby latches well and nurses actively with frequent swallows.  Breasts softened during feeding and baby came off content and relaxed.  Much teaching done and many questions answered.  Mom will discontinue formula and feed baby with feeding cues.  Follow up lactation appointment scheduled for Thursday 04/26/13 at 2 30 PM.  Initial Feeding Assessment:15 minutes left breast/15 minutes right breast Pre-feed Weight:3218 Post-feed [id number]:3304 Amount Transferred:86 mls Comments:  Additional Feeding Assessment: Pre-feed Weight: Post-feed Weight: Amount Transferred: Comments:  Additional Feeding Assessment: Pre-feed Weight: Post-feed Weight: Amount Transferred: Comments:  Total Breast milk Transferred this Visit: 86 mls Total Supplement Given: none  Additional Interventions:   Follow-Up  04/26/13 2 30pm      Octavio MannsSanders, Elizabeth [facility] 04/19/2013, 2:17 PM

## 2013-04-20 ENCOUNTER — Encounter: Payer: Self-pay | Admitting: Family Medicine

## 2013-04-24 NOTE — MAU Provider Note (Signed)
Attestation of Attending Supervision of Advanced Practitioner (CNM/NP): Evaluation and management procedures were performed by the Advanced Practitioner under my supervision and collaboration.  I have reviewed the Advanced Practitioner's note and chart, and I agree with the management and plan.  HARRAWAY-SMITH, Quentin Shorey 8:18 AM    

## 2013-04-26 ENCOUNTER — Ambulatory Visit (HOSPITAL_COMMUNITY)
Admission: RE | Admit: 2013-04-26 | Discharge: 2013-04-26 | Disposition: A | Discharge status: Home / Self Care | Payer: Medicaid Other | Source: Ambulatory Visit | Attending: Family Medicine | Admitting: Family Medicine

## 2013-05-21 ENCOUNTER — Ambulatory Visit (INDEPENDENT_AMBULATORY_CARE_PROVIDER_SITE_OTHER): Payer: Self-pay | Admitting: Family Medicine

## 2013-05-21 ENCOUNTER — Encounter: Payer: Self-pay | Admitting: Family Medicine

## 2013-05-21 VITALS — BP 104/75 | HR 103 | Temp 99.3°F | Ht <= 58 in | Wt 98.4 lb

## 2013-05-21 DIAGNOSIS — N61 Mastitis without abscess: Secondary | ICD-10-CM

## 2013-05-21 DIAGNOSIS — N6029 Fibroadenosis of unspecified breast: Secondary | ICD-10-CM

## 2013-05-21 MED ORDER — DICLOXACILLIN SODIUM 500 MG PO CAPS: 500.0000 mg | ORAL_CAPSULE | Freq: Four times a day (QID) | ORAL | Status: DC

## 2013-05-21 NOTE — Assessment & Plan Note (Signed)
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 Consider early screening with Mammogram if persistent complaints after resolution of suspected acute mastitis

## 2013-05-21 NOTE — Assessment & Plan Note (Addendum)
Consistent with acute mastitis, secondary to breastfeeding. Suspect generalized symptoms fevers/chills, body aches are secondary to mastitis. No other localizing symptoms (no URI, clear lungs, no GI complaints). Considered influenza (immunized 01/2013), however constellation / severity of symptoms does not seem consistent, and less likely with focal breast pain / skin changes.  Plan: 1. Start Dicloxacillin 500mg  QID x 10 days (#40) 2. Scheduled ibuprofen / Tylenol PRN, cold compresses 3. Continue to breast feed / breast pump - important to keep emptying breast milk 4. RTC in 2-3 days if persistent fevers or worsening symptoms, not responding to abx

## 2013-05-21 NOTE — Progress Notes (Signed)
Subjective:     Patient ID: Hannah Orozco, female   DOB: 06/13/1980, 33 y.o.   MRN: [id number]  Patient presents for a same day visit. Interview conducted with assistance of language line telephone Spanish interpreter.  HPI  BREAST PAIN: - Reports symptoms of bilateral breast pain, generalized body aches, subjective fevers/chills that started yesterday when she woke up. Notes red area of skin with increased tenderness on Right breast. Previously she had been well without any concerns. Recently had baby 04/12/13 (first pregnancy), had been breastfeeding every day, except for yesterday or today, however still admits to some pumping of breast milk in past 24 hours. Working with Sports coachlactation consultants in outpatient setting, no concerns about breastfeeding. - Tried taking Tylenol with some mild relief - Denies prior history mastitis, breast pain, or skin infection - Admits mild HA, generalized malaise - Denies CP, SOB, nausea / vomiting, abdominal pain, weakness  I have reviewed and updated the following as appropriate: allergies and current medications  Social Hx: Never smoker  Review of Systems  See above HPI     Objective:   Physical Exam  BP 104/75  Pulse 103  Temp(Src) 99.3 F (37.4 C) (Oral)  Ht 4\' 8"  (1.422 m)  Wt 98 lb 6.4 oz (44.634 kg)  BMI 22.07 kg/m2  Breastfeeding? Yes  [name] chaperone Arlyss Repress(Thekla Slade) present in room for physical exam.  Gen - pleasant, well-appearing, NAD HEENT - patent nares w/o congestion, oropharynx clear, MMM Breast: Right breast with localized lateral (9 o'clock) area of mild erythema (4x4cm), inc tenderness and induration, otherwise rest of breast and nipple non-tender and no erythema. Left breast with localized tenderness generalized medial region (10-11 o'clock) without any erythema, induration, otherwise rest of breast and nipple non-tender. Note left breast with small round mobile non-tender mass palpated just outside areola (3 o'clock) Heart -  RRR, no murmurs heard Lungs - CTAB, no wheezing, crackles, or rhonchi. Normal work of breathing. Abd - soft, NTND, no masses, +active BS Ext - non-tender, no edema, peripheral pulses intact +2 b/l Skin - warm, dry, mild erythema Right breast as above Neuro - awake, alert, oriented, grossly non-focal, intact muscle strength 5/5 b/l, intact distal sensation to light touch, gait normal     Assessment:     See specific A&P problem list for details.      Plan:     See specific A&P problem list for details.

## 2013-05-21 NOTE — Patient Instructions (Signed)
Hannah Orozco Hannah Orozco - Creo que usted tiene una infeccin de mastitis o de mama que es causada por la lactancia materna - Iniciar antibitico (dicloxacilina) 500mg  tomar 4 veces al Futures trader (o cada 6 horas) para un total de 10 das - Siga usando Tylenol - Tambin el uso de ibuprofeno 200-400mg  cada 4-6 horas (siga las instrucciones en la botella) - Pruebe compresa fra en el pecho para Engineer, materials - Muy recomendable continuar amamantando y el bombeo o la [place]. Es seguro para su beb para seguir alimentando y beber [employer]. Es slo la piel de la mama que est infectado. Si usted est reteniendo Autoliv har que su dolor empeore, as que animo a seguir alimentando o bomba.  Por favor, programar una cita de seguimiento con el Dr. Aviva Orozco en 2-3 das si sus sntomas no mejoran.  Si tiene [place] pregunta o inquietud, no dude en llamar a la clnica en contacto conmigo. Tambin puede programar una cita antes si es necesario.  Sin embargo, si sus Sport and exercise psychologist, por favor acuda a la sala de emergencia para buscar atencin mdica inmediata.  Hannah Orozco - I think that you have a mastitis or breast infection that is caused by breastfeeding - Start antibiotic (Dicloxacillin) 500mg  take 4 times a day (or every 6 hours) for total of 10 days - Continue to use Tylenol - Also use Ibuprofen 200-400mg  every 4-6 hours (follow instructions on the bottle) - Try cold compress on breast for pain relief - Highly recommend continuing to breastfeed and or pumping milk. It is safe for your baby to continue to feed and drink this milk. It is only the skin of the breast that is infected. If you are retaining milk it will make your pain worse, so we encourage you to continue to feed or pump.  Please schedule a follow-up appointment with Dr. Aviva Orozco in 2-3 days if your symptoms are not improving.  If you have any other questions or concerns, please feel free to call the clinic to  contact me. You may also schedule an earlier appointment if necessary.  However, if your symptoms get significantly worse, please go to the Emergency Department to seek immediate medical attention.  Hannah Pilar, DO [facility]   Mastitis  (Mastitis ) La mastitis es una inflamacin en el tejido [place]. Generalmente ocurre en las mujeres que [place], pero tambin puede afectar a otras mujeres y, en algunos [place], a los hombres. CAUSAS  Generalmente la causa de la mastitis es una infeccin bacteriana. Las bacterias ingresan al tejido mamario travs de cortes o grietas en la piel. Generalmente esto ocurre al [employer], debido a las grietas o irritacin de la piel. En algunos casos puede ocurrir an cuando no Sports coach. Tambin se relaciona con la obstruccin de los conductos por los que Product/process development scientist (lactferos). Un piercing en los pezones puede ocasionar una mastitis. Adems, algunas formas de cncer de mama pueden causar mastitis. SIGNOS Y SNTOMAS   Hinchazn, enrojecimiento, sensibilidad y dolor en la zona de la mama.  Hinchazn de los ganglios que se encuentran debajo del brazo, en el mismo lado.  Grant Ruts. Si se permite que la infeccin progrese, podr formarse una acumulacin de pus (absceso). DIAGNSTICO  El mdico podr hacer el diagnstico de mastitis en base a sus sntomas y el examen fsico. Le indicarn estudios para confirmar el diagnstico. Estos pueden ser:   Extraccin del pus de la mama,  aplicando presin en la zona. El pus se examinar en el laboratorio para determinar de qu bacteria se trata. Si hay un absceso, podrn retirarle el lquido con [place]una aguja. Con el lquido se confirmar el diagnstico y se Production assistant, radiodeterminar la bacteria que causa el problema. En la International Business Machinesmayora de los casos no se observa pus.  Le solicitarn anlisis de sangre para determinar si su organismo est luchando contra una infeccin bacteriana.  Una mamografa o una  ecografa podrn descartar otros problemas o enfermedades. TRATAMIENTO  Antibiticos para combatir la infeccin bacteriana. El Nurse, children'sprofesional determinar qu bacteria es la que est causando la infeccin y Art gallery managerseleccionar el tipo de antibitico ms adecuado. Podr cambiarlo segn el resultado del cultivo o si la respuesta al antibitico no es la [place] May[place] Islandsadecuada. Los antibiticos se administran por va oral. Tambin le recetar medicamentos para Chief Technology Officerel dolor. La mastitis que se produce debido al amamantamiento podr mejorar sin tratamiento, por lo tanto el mdico podr indicarle que espere 24 horas despus de verla por primera vez para decidir si necesita recetarle un medicamento. INSTRUCCIONES PARA EL CUIDADO EN EL HOGAR   Slo tome medicamentos de venta libre o recetados para Primary school teachercalmar el dolor, Environmental health practitionerel malestar o bajar la fiebre, segn las indicaciones de su mdico.  Si su mdico le receta antibiticos, tmelos tal First Data Corporationcomo le indic. Asegrese de que finaliza la prescripcin completa aunque se sienta mejor.  No use un sostn demasiado ajustado o con aro. Use un sostn blando, de soporte.  Aumente la ingestin de lquidos, especialmente si tiene fiebre.  Las mujeres que CDW Corporationamamantan deben seguir las siguientes indicaciones:  Regulatory affairs officerAmamante hasta vaciar la mama. El Ecologistprofesional le informar si su leche es segura para el beb o debe descartarla. Podrn indicarle que deje de Avery Dennisonamamantar hasta que el mdico considere que es seguro para su beb. Use un sacaleche si le aconsejan dejar de amamantar.  Mantenga los pezones secos y limpios.  Vace la primera mama completamente antes de amamantar con la segunda. Si el beb no vaca la mama por algn motivo, utilice un sacaleche.  Si debe regresar a Engineer, watersu empleo, use un sacaleche en el horario de trabajo para State Street Corporationmantener los horarios.  Evite que las [address]amas se llenen mucho de [place] (congestin) SOLICITE ATENCIN MDICA SI:   Shelle Ironiene una secrecin similar a pus por la mama.  Los sntomas no mejoran  con el tratamiento indicado por su mdico dentro de los 2 [address]  [address]. SOLICITE ATENCIN MDICA DE INMEDIATO SI:   El dolor y la hinchazn empeoran.  Aumenta el dolor y no puede controlarlo con Tourist information centre managerla medicacin.  Observa una lnea roja que se extiende desde la mama hasta la axila.  Tiene fiebre o sntomas persistentes durante ms de 2 - 3 das.  Tiene fiebre y los sntomas empeoran repentinamente. Document Released: 12/09/2004 Document Revised: 12/20/2012 [facility]Care Patient Information 2014 [place]ExitCare, [place].  Mastitis  (Mastitis ) La mastitis es la irritacin, dolor e hinchazn (inflamacin) de una zona en la mama. La causa es una infeccin que se produce cuando una bacteria entra a travs de la piel. La infeccin puede tratarse con antibiticos. CUIDADOS EN EL HOGAR  Tome slo los medicamentos segn le haya indicado el mdico.  Si el mdico le ha recetado antibiticos, tmelos segn las indicaciones. Finalice la prescripcin completa, aunque se sienta mejor.  No use un sostn demasiado ajustado o con aro. Use un sostn de soporte.  Beba ms lquidos, especialmente si tiene fiebre.  Si est amamantando:  Mantenga la mama vaca. El Office Depotmdico le  dir si su leche es segura. Use un sacaleche si le aconsejan dejar de amamantar.  Mantenga los pezones secos y limpios.  Vace la primera mama antes de amamantar con la segunda. Use un sacaleche si el beb no vaca la mama.  Si vuelve a trabajar, use el sacaleches mientras est en el trabajo.  Evite que las [address] se llenen mucho de [place] (congestin) SOLICITE AYUDA SI:  Observa que sale un lquido similar a pus por la mama.  Los sntomas no mejoran dentro de los [address]  [address] SOLICITE AYUDA DE INMEDIATO SI:  El dolor y la inflamacin empeoran.  El dolor no se alivia con los [employer].  Observa una lnea roja que se extiende desde la mama hasta la axila.  Tiene fiebre o sntomas que persisten durante ms de 2-3 das.  Tiene fiebre y los  sntomas empeoran repentinamente. ASEGRESE DE QUE:   Comprende estas instrucciones.  Controlar su afeccin.  Recibir ayuda de inmediato si no mejora o si empeora. Document Released: 04/03/2010 Document Revised: 12/20/2012 [facility] Patient Information 2014 [place], [place].

## 2013-05-29 ENCOUNTER — Encounter: Payer: Self-pay | Admitting: Family Medicine

## 2013-05-29 ENCOUNTER — Ambulatory Visit (INDEPENDENT_AMBULATORY_CARE_PROVIDER_SITE_OTHER): Payer: Medicaid Other | Admitting: Family Medicine

## 2013-05-29 NOTE — Patient Instructions (Signed)
Su examen es normal hoy. Puede re-iniciar actividad sexual. recuerde que dar el pecho no es suficiente para prevenir Psychiatristembarazo y se recomienda un periodo de 2 a~nos Advertising account executiveentre embarazos.

## 2013-05-29 NOTE — Assessment & Plan Note (Signed)
Recently diagnosed with mastitis. Improved and still on treatment. Pre-eclampsia with mag sulfate during delivery. No signs of this condition through postpartum F/u as needed

## 2013-05-29 NOTE — Progress Notes (Signed)
Subjective:     Hannah DickensMaria L Orozco is a 33 y.o. female who presents for a postpartum visit. She is 6 weeks postpartum following a spontaneous vaginal delivery complicated with preeclampsia on mag sulfate during labor. I have fully reviewed the prenatal and intrapartum course. The delivery was at 39 gestational weeks. Outcome: spontaneous vaginal delivery. Anesthesia: epidural. Postpartum course complicated with recetly mastitis diagnosed Baby's course has been uncomplicated. Baby is feeding by both breast and bottle - Carnation Good Start. Bleeding no bleeding. Bowel function is normal. Bladder function is normal. Patient is not sexually active. Contraception method is Depo-Provera injections. Postpartum depression screening: negative.  The following portions of the patient's history were reviewed and updated as appropriate: allergies, current medications, past family history, past medical history, past social history, past surgical history and problem list.  Review of Systems Pertinent items are noted in HPI.   Objective:    There were no vitals taken for this visit.  General:  alert and cooperative   Breasts:  inspection negative, no nipple discharge or bleeding, no masses or nodularity palpable  Lungs: clear to auscultation bilaterally  Heart:  regular rate and rhythm, S1, S2 normal, no murmur, click, rub or gallop  Abdomen: soft, non-tender; bowel sounds normal; no masses,  no organomegaly   Vulva:  normal  Vagina: normal vagina  Cervix:  no lesions  Corpus: normal  Adnexa:  no mass, fullness, tenderness  Rectal Exam: Not performed.        Assessment:    Normal postpartum exam.   Plan:    1. Contraception: planning Depo-Provera injections but since financial restrains she would [facility]  [facility]. 2. Recent mastitis still on treatment. Recommended to finalized antibiotic.  3. Follow up as needed.

## 2013-07-30 ENCOUNTER — Ambulatory Visit: Payer: Self-pay

## 2013-08-27 ENCOUNTER — Other Ambulatory Visit: Payer: Self-pay | Admitting: Family Medicine

## 2013-08-27 DIAGNOSIS — K219 Gastro-esophageal reflux disease without esophagitis: Secondary | ICD-10-CM

## 2013-08-27 MED ORDER — RANITIDINE HCL 150 MG PO TABS: 150.0000 mg | ORAL_TABLET | Freq: Two times a day (BID) | ORAL | Status: DC

## 2013-11-09 ENCOUNTER — Encounter: Payer: Self-pay | Admitting: Internal Medicine

## 2013-11-09 ENCOUNTER — Ambulatory Visit: Payer: No Typology Code available for payment source | Attending: Internal Medicine | Admitting: Internal Medicine

## 2013-11-09 VITALS — BP 104/68 | HR 83 | Temp 98.0°F | Resp 16 | Ht <= 58 in | Wt 92.0 lb

## 2013-11-09 DIAGNOSIS — Z8349 Family history of other endocrine, nutritional and metabolic diseases: Secondary | ICD-10-CM

## 2013-11-09 DIAGNOSIS — F411 Generalized anxiety disorder: Secondary | ICD-10-CM | POA: Insufficient documentation

## 2013-11-09 DIAGNOSIS — Z0000 Encounter for general adult medical examination without abnormal findings: Secondary | ICD-10-CM

## 2013-11-09 DIAGNOSIS — N61 Mastitis without abscess: Secondary | ICD-10-CM | POA: Insufficient documentation

## 2013-11-09 DIAGNOSIS — D649 Anemia, unspecified: Secondary | ICD-10-CM | POA: Insufficient documentation

## 2013-11-09 DIAGNOSIS — K219 Gastro-esophageal reflux disease without esophagitis: Secondary | ICD-10-CM | POA: Insufficient documentation

## 2013-11-09 DIAGNOSIS — Z79899 Other long term (current) drug therapy: Secondary | ICD-10-CM | POA: Insufficient documentation

## 2013-11-09 DIAGNOSIS — R6889 Other general symptoms and signs: Secondary | ICD-10-CM

## 2013-11-09 DIAGNOSIS — D696 Thrombocytopenia, unspecified: Secondary | ICD-10-CM | POA: Insufficient documentation

## 2013-11-09 LAB — CBC
HCT: 35.4 % — ABNORMAL LOW (ref 36.0–46.0)
HEMOGLOBIN: 11.9 g/dL — AB (ref 12.0–15.0)
MCH: 30.8 pg (ref 26.0–34.0)
MCHC: 33.6 g/dL (ref 30.0–36.0)
MCV: 91.7 fL (ref 78.0–100.0)
Platelets: 205 10*3/uL (ref 150–400)
RBC: 3.86 MIL/uL — AB (ref 3.87–5.11)
RDW: 13.8 % (ref 11.5–15.5)
WBC: 5 10*3/uL (ref 4.0–10.5)

## 2013-11-09 MED ORDER — DICLOXACILLIN SODIUM 500 MG PO CAPS: 500.0000 mg | ORAL_CAPSULE | Freq: Four times a day (QID) | ORAL | Status: DC

## 2013-11-09 NOTE — Progress Notes (Signed)
Pt is here to establish care. Pt is here for a physical and pap smear. Pt reports getting very cold and loosing hair. A week ago pt was having pain in her breast w/ redness and widespread aching.

## 2013-11-09 NOTE — Progress Notes (Signed)
Patient ID: Hannah Orozco, female   DOB: November 11, 1980, 33 y.o.   MRN: [id number]  HQI:[id number]  XLK:[id number]  DOB - 12-05-1980  CC:  Chief Complaint  Patient presents with  . Establish Care       HPI: Hannah Orozco is a 33 y.o. female here today to establish medical care.  Patient has history of anxiety and Gerd.  Today she would like to have a full physical.  She c/o of breast tenderness and pain on the right side.  She reports that she has been on the Depo Provera injection for six months and has had some irregular cycles since then.  Has had a history of mastitis and feels like the same thing as before.  She endorses body aches, chills, and erythema.  She has been taking tylenol for pain with significant relief.  She is currently breast feeding her 39 month old baby.  C/o of always cold and hair loss for past 2.5 months.    No Known Allergies Past Medical History  Diagnosis Date  . Anxiety     no meds, doing ok  . Anemia   . Thrombocytopenia   . GERD (gastroesophageal reflux disease)    Current Outpatient Prescriptions on File Prior to Visit  Medication Sig Dispense Refill  . ranitidine (ZANTAC) 150 MG tablet Take 1 tablet (150 mg total) by mouth 2 (two) times daily.  60 tablet  2  . dicloxacillin (DYNAPEN) 500 MG capsule Take 1 capsule (500 mg total) by mouth 4 (four) times daily.  40 capsule  0  . ibuprofen (ADVIL,MOTRIN) 600 MG tablet Take 1 tablet (600 mg total) by mouth every 6 (six) hours as needed for fever, headache, moderate pain or cramping.  30 tablet  0  . Prenatal Vit-Fe Fumarate-FA (PRENATAL MULTIVITAMIN) TABS tablet Take 1 tablet by mouth daily at 12 noon.       No current facility-administered medications on file prior to visit.   Family History  Problem Relation Age of Onset  . Hearing loss Neg Hx   . Hypertension Mother    History   Social History  . Marital Status: Married    Spouse Name: N/A    Number of Children: N/A  . Years of Education: N/A    Occupational History  . Not on file.   Social History Main Topics  . Smoking status: Never Smoker   . Smokeless tobacco: Never Used  . Alcohol Use: No  . Drug Use: No  . Sexual Activity: Yes   Other Topics Concern  . Not on file   Social History Narrative   Married. Works Education officer, environmental houses.    Review of Systems  Constitutional: Positive for chills. Negative for malaise/fatigue.       Hair loss   Eyes: Negative for blurred vision, double vision and photophobia.  Respiratory: Negative.   Cardiovascular: Negative.   Gastrointestinal: Negative for nausea, vomiting, abdominal pain and constipation.  Genitourinary: Negative.   Musculoskeletal: Negative.   Neurological: Positive for headaches (infrequenrly, frontal and bitemporal). Negative for dizziness, tingling and weakness.  Psychiatric/Behavioral: Negative.        Objective:   Filed Vitals:   11/09/13 1502  BP: 104/68  Pulse: 83  Temp: 98 F (36.7 C)  Resp: 16    Physical Exam  Constitutional: She is oriented to person, place, and time.  HENT:  Head: Normocephalic.  Right Ear: External ear normal.  Left Ear: External ear normal.  Mouth/Throat: Oropharynx is clear and moist.  Eyes: Conjunctivae and EOM are normal. Pupils are equal, round, and reactive to light.  Neck: Normal range of motion. Neck supple. No thyromegaly present.  Cardiovascular: Normal rate, regular rhythm and normal heart sounds.   Pulmonary/Chest: Effort normal and breath sounds normal. Right breast exhibits tenderness. Right breast exhibits no inverted nipple, no mass, no nipple discharge and no skin change. Left breast exhibits no inverted nipple, no mass, no nipple discharge, no skin change and no tenderness.  Abdominal: Soft. Bowel sounds are normal.  Musculoskeletal: Normal range of motion. She exhibits no edema and no tenderness.  Lymphadenopathy:    She has no cervical adenopathy.  Neurological: She is alert and oriented to person,  place, and time.  Skin: Skin is warm and dry.  Psychiatric: She has a normal mood and affect. Thought content normal.   .  Lab Results  Component Value Date   WBC 26.9* 04/13/2013   HGB 10.8* 04/13/2013   HCT 31.2* 04/13/2013   MCV 92.9 04/13/2013   PLT 91* 04/13/2013   Lab Results  Component Value Date   CREATININE 0.70 04/15/2013   BUN 15 04/15/2013   NA 138 04/15/2013   K 3.9 04/15/2013   CL 102 04/15/2013   CO2 26 04/15/2013    No results found for this basename: HGBA1C   Lipid Panel  No results found for this basename: chol, trig, hdl, cholhdl, vldl, ldlcalc       Assessment and plan:   Raylan was seen today for establish care.  Diagnoses and associated orders for this visit:  Cold intolerance - CBC  Annual physical exam - Lipid panel; Future  Mastitis, right, acute - dicloxacillin (DYNAPEN) 500 MG capsule; Take 1 capsule (500 mg total) by mouth 4 (four) times daily. Wait and watch method  Family history of thyroid disease - TSH    Return if symptoms worsen or fail to improve.  The patient was given clear instructions to go to ER or return to medical center if symptoms don't improve, worsen or new problems develop. The patient verbalized understanding.  Due to language barrier, an interpreter was present during the history-taking and subsequent discussion (and for part of the physical exam) with this patient.  Holland Commons, NP-C [facility] [phone] 11/09/2013, 3:14 PM

## 2013-11-09 NOTE — Patient Instructions (Signed)
Mastitis  °(Mastitis) °La mastitis es una inflamación en el tejido mamario. Generalmente ocurre en las mujeres que amamantan, pero también puede afectar a otras mujeres y, en algunos casos, a los hombres. °CAUSAS  °Generalmente la causa de la mastitis es una infección bacteriana. Las bacterias ingresan al tejido mamario través de cortes o grietas en la piel. Generalmente esto ocurre al amamantar, debido a las grietas o irritación de la piel. En algunos casos puede ocurrir aún cuando no haya grietas en la piel. También se relaciona con la obstrucción de los conductos por los que sale la leche (lactíferos). Un piercing en los pezones puede ocasionar una mastitis. Además, algunas formas de cáncer de mama pueden causar mastitis. °SIGNOS Y SÍNTOMAS  °· Hinchazón, enrojecimiento, sensibilidad y dolor en la zona de la mama. °· Hinchazón de los ganglios que se encuentran debajo del brazo, en el mismo lado. °· Fiebre. °Si se permite que la infección progrese, podrá formarse una acumulación de pus (absceso). °DIAGNÓSTICO  °El médico podrá hacer el diagnóstico de mastitis en base a sus síntomas y el examen físico. Le indicarán estudios para confirmar el diagnóstico. Estos pueden ser:  °· Extracción del pus de la mama, aplicando presión en la zona. El pus se examinará en el laboratorio para determinar de qué bacteria se trata. Si hay un absceso, podrán retirarle el líquido con una aguja. Con el líquido se confirmará el diagnóstico y se determinará la bacteria que causa el problema. En la mayoría de los casos no se observa pus. °· Le solicitarán análisis de sangre para determinar si su organismo está luchando contra una infección bacteriana. °· Una mamografía o una ecografía podrán descartar otros problemas o enfermedades. °TRATAMIENTO  °Antibióticos para combatir la infección bacteriana. El profesional determinará qué bacteria es la que está causando la infección y seleccionará el tipo de antibiótico más adecuado. Podrá  cambiarlo según el resultado del cultivo o si la respuesta al antibiótico no es la adecuada. Los antibióticos se administran por vía oral. También le recetará medicamentos para el dolor. °La mastitis que se produce debido al amamantamiento podrá mejorar sin tratamiento, por lo tanto el médico podrá indicarle que espere 24 horas después de verla por primera vez para decidir si necesita recetarle un medicamento. °INSTRUCCIONES PARA EL CUIDADO EN EL HOGAR  °· Sólo tome medicamentos de venta libre o recetados para calmar el dolor, el malestar o bajar la fiebre, según las indicaciones de su médico. °· Si su médico le receta antibióticos, tómelos tal como le indicó. Asegúrese de que finaliza la prescripción completa aunque se sienta mejor. °· No use un sostén demasiado ajustado o con aro. Use un sostén blando, de soporte. °· Aumente la ingestión de líquidos, especialmente si tiene fiebre. °· Las mujeres que amamantan deben seguir las siguientes indicaciones: °¨ Amamante hasta vaciar la mama. El profesional le informará si su leche es segura para el bebé o debe descartarla. Podrán indicarle que deje de amamantar hasta que el médico considere que es seguro para su bebé. Use un sacaleche si le aconsejan dejar de amamantar. °¨ Mantenga los pezones secos y limpios. °¨ Vacíe la primera mama completamente antes de amamantar con la segunda. Si el bebé no vacía la mama por algún motivo, utilice un sacaleche. °¨ Si debe regresar a su empleo, use un sacaleche en el horario de trabajo para mantener los horarios. °¨ Evite que las mamas se llenen mucho de leche (congestión) °SOLICITE ATENCIÓN MÉDICA SI:  °· Tiene una secreción similar a pus por   la mama. °· Los síntomas no mejoran con el tratamiento indicado por su médico dentro de los 2 días. °SOLICITE ATENCIÓN MÉDICA DE INMEDIATO SI:  °· El dolor y la hinchazón empeoran. °· Aumenta el dolor y no puede controlarlo con la medicación. °· Observa una línea roja que se extiende desde la  mama hasta la axila. °· Tiene fiebre o síntomas persistentes durante más de 2 - 3 días. °· Tiene fiebre y los síntomas empeoran repentinamente. °Document Released: 12/09/2004 Document Revised: 03/06/2013 °ExitCare® Patient Information ©2015 [employer], LLC. This information is not intended to replace advice given to you by your health care provider. Make sure you discuss any questions you have with your health care provider. ° °

## 2013-11-10 LAB — TSH: TSH: 1.626 u[IU]/mL (ref 0.350–4.500)

## 2013-11-14 ENCOUNTER — Telehealth: Payer: Self-pay | Admitting: *Deleted

## 2013-11-14 ENCOUNTER — Other Ambulatory Visit: Payer: No Typology Code available for payment source

## 2013-11-14 NOTE — Telephone Encounter (Signed)
Patient informed via [employer]  about her blood work improving and thyroid is normal. Patient also informed to continue to take her multi-vitamin with iron. Patient wanted to know if she could schedule an appointment to have her lipid panel drawn. Patient scheduled for lab visit 11/15/2013 at 0915. Patient verbalized understanding. Annamaria Helling, RN

## 2013-11-14 NOTE — Telephone Encounter (Signed)
Message copied by Sabino Denning, Arman Filter on Wed Nov 14, 2013 12:21 PM ------      Message from: Holland Commons A      Created: Tue Nov 13, 2013  2:22 PM       Patient blood count has improved since last blood draw.  She may continue to use her multi-vitamin with iron. Thyroid is normal ------

## 2013-11-15 ENCOUNTER — Ambulatory Visit: Payer: No Typology Code available for payment source | Attending: Internal Medicine

## 2013-11-15 DIAGNOSIS — Z0000 Encounter for general adult medical examination without abnormal findings: Secondary | ICD-10-CM

## 2013-11-15 LAB — LIPID PANEL
Cholesterol: 178 mg/dL (ref 0–200)
HDL: 61 mg/dL (ref >39)
LDL Cholesterol: 104 mg/dL — ABNORMAL HIGH (ref 0–99)
Total CHOL/HDL Ratio: 2.9 Ratio
Triglycerides: 63 mg/dL (ref <150)
VLDL: 13 mg/dL (ref 0–40)

## 2013-11-20 ENCOUNTER — Telehealth: Payer: Self-pay | Admitting: *Deleted

## 2013-11-20 NOTE — Telephone Encounter (Signed)
Message copied by Dyann Kief on Tue Nov 20, 2013 12:59 PM ------      Message from: Hannah Orozco      Created: Fri Nov 16, 2013  3:43 PM       Cholesterol is good. Please educate on diet and exercise ------

## 2013-11-20 NOTE — Telephone Encounter (Signed)
Pt aware of lab results. Educated on diet and exercise

## 2013-12-18 ENCOUNTER — Ambulatory Visit: Payer: No Typology Code available for payment source | Attending: Internal Medicine

## 2014-01-03 ENCOUNTER — Ambulatory Visit: Payer: Self-pay | Attending: Internal Medicine | Admitting: Internal Medicine

## 2014-01-03 VITALS — BP 94/62 | HR 79 | Temp 98.8°F | Resp 16

## 2014-01-03 DIAGNOSIS — K0889 Other specified disorders of teeth and supporting structures: Secondary | ICD-10-CM

## 2014-01-03 DIAGNOSIS — K088 Other specified disorders of teeth and supporting structures: Secondary | ICD-10-CM

## 2014-01-04 ENCOUNTER — Telehealth: Payer: Self-pay | Admitting: Internal Medicine

## 2014-01-04 NOTE — Telephone Encounter (Signed)
Patient mentioned that her apartment has potential mold and was told that dr. Luna GlasgowKeck would write a letter so that she can present to the apartment complex so that she is able to break her contract and move to another apt. Please follow up with pt.

## 2014-01-07 NOTE — Telephone Encounter (Signed)
Please f/u     Patient mentioned that her apartment has potential mold and was told that dr. Luna GlasgowKeck would write a letter so that she can present to the apartment complex so that she is able to break her contract and move to another apt. Please follow up with pt.

## 2014-01-08 ENCOUNTER — Encounter: Payer: Self-pay | Admitting: Internal Medicine

## 2014-01-08 NOTE — Addendum Note (Signed)
Addended by: Holland CommonsKECK, VALERIE A on: 01/08/2014 11:03 AM   Modules accepted: Orders

## 2014-01-08 NOTE — Progress Notes (Addendum)
Patient ID: Livingston DionesMaria L Ramthun, female   DOB: 03/11/1981, 33 y.o.   MRN: [id number]  CC: Dental referral  HPI: Patient presents to clinic today for a dental referral.  Patient reports that she has been having left-sided dental pain for the past month.  She reports that when she brushes her teeth she often has bleeding from her gums.  Patient also reports that she's been having headaches for the past 2 months that are occurring 1-2 times per week.  Patient is unable to describe the pain but denies nausea, vomiting, photophobia, phonophobia.  She reports that she's been taking Tylenol for pain which seems to help her symptoms. Patient also reports that she has been having bloating we'll do build up in her home for the past few months. Patient does have a 1543-month-old at home and states that mold is beginning to grow on her furniture and clothes.  Patient is concerned that the mold will begin to affect her and her young child health.   No Known Allergies Past Medical History  Diagnosis Date  . Anxiety     no meds, doing ok  . Anemia   . Thrombocytopenia   . GERD (gastroesophageal reflux disease)    Current Outpatient Prescriptions on File Prior to Visit  Medication Sig Dispense Refill  . ibuprofen (ADVIL,MOTRIN) 600 MG tablet Take 1 tablet (600 mg total) by mouth every 6 (six) hours as needed for fever, headache, moderate pain or cramping.  30 tablet  0  . Prenatal Vit-Fe Fumarate-FA (PRENATAL MULTIVITAMIN) TABS tablet Take 1 tablet by mouth daily at 12 noon.      . ranitidine (ZANTAC) 150 MG tablet Take 1 tablet (150 mg total) by mouth 2 (two) times daily.  60 tablet  2   No current facility-administered medications on file prior to visit.   Family History  Problem Relation Age of Onset  . Hearing loss Neg Hx   . Hypertension Mother    History   Social History  . Marital Status: Married    Spouse Name: N/A    Number of Children: N/A  . Years of Education: N/A   Occupational History  .  Not on file.   Social History Main Topics  . Smoking status: Never Smoker   . Smokeless tobacco: Never Used  . Alcohol Use: No  . Drug Use: No  . Sexual Activity: Yes   Other Topics Concern  . Not on file   Social History Narrative   Married. Works Education officer, environmentalcleaning houses.     Review of Systems: The history of present illness   Objective:   Filed Vitals:   01/03/14 1639  BP: 94/62  Pulse: 79  Temp: 98.8 F (37.1 C)  Resp: 16    Physical Exam  Constitutional: She is oriented to person, place, and time.  HENT:  Right Ear: External ear normal.  Left Ear: External ear normal.  Mouth/Throat: Oropharynx is clear and moist.  Cardiovascular: Normal rate, regular rhythm and normal heart sounds.   Pulmonary/Chest: Effort normal and breath sounds normal.  Neurological: She is alert and oriented to person, place, and time. No cranial nerve deficit.     Lab Results  Component Value Date   WBC 5.0 11/09/2013   HGB 11.9* 11/09/2013   HCT 35.4* 11/09/2013   MCV 91.7 11/09/2013   PLT 205 11/09/2013   Lab Results  Component Value Date   CREATININE 0.70 04/15/2013   BUN 15 04/15/2013   NA 138 04/15/2013  K 3.9 04/15/2013   CL 102 04/15/2013   CO2 26 04/15/2013    No results found for this basename: HGBA1C   Lipid Panel     Component Value Date/Time   CHOL 178 11/15/2013 0942   TRIG 63 11/15/2013 0942   HDL 61 11/15/2013 0942   CHOLHDL 2.9 11/15/2013 0942   VLDL 13 11/15/2013 0942   LDLCALC 104* 11/15/2013 0942       Assessment and plan:   [name] was seen today for dental pain.  Diagnoses and associated orders for this visit:  Pain, dental Patient given a list of low cost dentist Patient may continue tylenol or ibuprofen use  Due to language barrier, an interpreter was present during the history-taking and subsequent discussion (and for part of the physical exam) with this patient.  If fever, chills, or problems does not improve patient will need to RTC.      Holland CommonsKECK, Ernie Kasler,  NP-C [facility] [phone] 01/08/2014, 10:33 AM

## 2014-01-14 ENCOUNTER — Encounter: Payer: Self-pay | Admitting: Internal Medicine

## 2014-01-24 ENCOUNTER — Encounter: Payer: Self-pay | Admitting: Internal Medicine

## 2014-01-24 ENCOUNTER — Ambulatory Visit: Payer: Self-pay | Attending: Internal Medicine | Admitting: Internal Medicine

## 2014-01-24 ENCOUNTER — Ambulatory Visit: Payer: Self-pay | Admitting: Internal Medicine

## 2014-01-24 VITALS — BP 105/70 | HR 82 | Temp 98.5°F | Resp 16 | Ht <= 58 in | Wt 91.0 lb

## 2014-01-24 DIAGNOSIS — R780 Finding of alcohol in blood: Secondary | ICD-10-CM | POA: Insufficient documentation

## 2014-01-24 DIAGNOSIS — K219 Gastro-esophageal reflux disease without esophagitis: Secondary | ICD-10-CM | POA: Insufficient documentation

## 2014-01-24 DIAGNOSIS — I10 Essential (primary) hypertension: Secondary | ICD-10-CM | POA: Insufficient documentation

## 2014-01-24 DIAGNOSIS — J029 Acute pharyngitis, unspecified: Secondary | ICD-10-CM

## 2014-01-24 DIAGNOSIS — Z331 Pregnant state, incidental: Secondary | ICD-10-CM

## 2014-01-24 DIAGNOSIS — Z3490 Encounter for supervision of normal pregnancy, unspecified, unspecified trimester: Secondary | ICD-10-CM

## 2014-01-24 DIAGNOSIS — N912 Amenorrhea, unspecified: Secondary | ICD-10-CM

## 2014-01-24 LAB — POCT RAPID STREP A (OFFICE): Rapid Strep A Screen: NEGATIVE

## 2014-01-24 LAB — POCT URINE PREGNANCY: Preg Test, Ur: POSITIVE

## 2014-01-24 MED ORDER — PRENATAL MULTIVITAMIN CH: 1.0000 | ORAL_TABLET | Freq: Every day | ORAL | Status: DC

## 2014-01-24 NOTE — Progress Notes (Signed)
Pt is here today b/c she has has a cough and a sore throat since October 31. Pt states that she is late on her period.

## 2014-01-24 NOTE — Progress Notes (Signed)
Patient ID: Hannah Orozco, female   DOB: 12/22/1980, 33 y.o.   MRN: [id number]  CC: late cycle, sore throat  HPI:  Patient presents to clinic today for evaluation of a late menstrual period.  Patient reports that her last initial period was September 30 but she was also on the Depo-Provera injection.  He reports that she was in between cycles and never noticed the October cycle.  The day she also complains of sore throat, hoarseness, cough withyellow sputum production.  She denies chest pain, shortness of breath, headaches, wheezing, fever, chills.  She has tried taking over-the-counter Tylenol for pain.    No Known Allergies Past Medical History  Diagnosis Date  . Anxiety     no meds, doing ok  . Anemia   . Thrombocytopenia   . GERD (gastroesophageal reflux disease)    Current Outpatient Prescriptions on File Prior to Visit  Medication Sig Dispense Refill  . ibuprofen (ADVIL,MOTRIN) 600 MG tablet Take 1 tablet (600 mg total) by mouth every 6 (six) hours as needed for fever, headache, moderate pain or cramping. 30 tablet 0  . Prenatal Vit-Fe Fumarate-FA (PRENATAL MULTIVITAMIN) TABS tablet Take 1 tablet by mouth daily at 12 noon.    . ranitidine (ZANTAC) 150 MG tablet Take 1 tablet (150 mg total) by mouth 2 (two) times daily. 60 tablet 2   No current facility-administered medications on file prior to visit.   Family History  Problem Relation Age of Onset  . Hearing loss Neg Hx   . Hypertension Mother    History   Social History  . Marital Status: Married    Spouse Name: N/A    Number of Children: N/A  . Years of Education: N/A   Occupational History  . Not on file.   Social History Main Topics  . Smoking status: Never Smoker   . Smokeless tobacco: Never Used  . Alcohol Use: No  . Drug Use: No  . Sexual Activity: Yes   Other Topics Concern  . Not on file   Social History Narrative   Married. Works Education officer, environmentalcleaning houses.     Review of Systems: See hpi   Objective:    Filed Vitals:   01/24/14 1044  BP: 105/70  Pulse: 82  Temp: 98.5 F (36.9 C)  Resp: 16    Physical Exam  Constitutional: She is oriented to person, place, and time.  HENT:  Right Ear: External ear normal.  Left Ear: External ear normal.  Mouth/Throat: Oropharynx is clear and moist.  Eyes: Conjunctivae are normal. Pupils are equal, round, and reactive to light.  Cardiovascular: Normal rate, regular rhythm and normal heart sounds.   Pulmonary/Chest: Effort normal and breath sounds normal. She has no wheezes.  Lymphadenopathy:    She has no cervical adenopathy.  Neurological: She is alert and oriented to person, place, and time.  Skin: Skin is warm and dry.     Lab Results  Component Value Date   WBC 5.0 11/09/2013   HGB 11.9* 11/09/2013   HCT 35.4* 11/09/2013   MCV 91.7 11/09/2013   PLT 205 11/09/2013   Lab Results  Component Value Date   CREATININE 0.70 04/15/2013   BUN 15 04/15/2013   NA 138 04/15/2013   K 3.9 04/15/2013   CL 102 04/15/2013   CO2 26 04/15/2013    No results found for: HGBA1C Lipid Panel     Component Value Date/Time   CHOL 178 11/15/2013 0942   TRIG 63 11/15/2013 0942  HDL 61 11/15/2013 0942   CHOLHDL 2.9 11/15/2013 0942   VLDL 13 11/15/2013 0942   LDLCALC 104* 11/15/2013 0942       Assessment and plan:   Hannah Orozco was seen today for follow-up.  Diagnoses and associated orders for this visit:  Pregnant - Ambulatory referral to Obstetrics / Gynecology--need proper prenatal care - Prenatal Vit-Fe Fumarate-FA (PRENATAL MULTIVITAMIN) TABS tablet; Take 1 tablet by mouth daily at 12 noon. Gave patient resources [facility] Sheral [facility] program [facility]  Amenorrhea - POCT urine pregnancy---positive  Pharyngitis - Rapid Strep A--negative - Throat culture Hannah Orozco([facility]) Likely viral, gave patient safe OTC options for symptom management  Follow up as needed  Due to language barrier, an interpreter was present during the  history-taking and subsequent discussion (and for part of the physical exam) with this patient.    Hannah Orozco, Hannah Heather, NP-C [facility] [phone] 01/24/2014, 11:23 AM

## 2014-01-24 NOTE — Patient Instructions (Signed)
Plain Robitussin, ask pharmacy   Tos - Adultos  (Cough, Adult)  La tos es un reflejo que ayuda a limpiar las vas areas y Administratorla garganta. Puede ayudar a curar el organismo o ser Neomia Dearuna reaccin a un irritante. La tos puede durar General Electricentre 2  3 semanas (aguda) o puede durar ms de 8 semanas (crnica)  CAUSAS  Tos aguda:   Infecciones virales o bacterianas. Tos crnica.   Infecciones.  Alergias.  Asma.  Goteo post nasal.  El hbito de fumar.  Acidez o reflujo gstrico.  Algunos medicamentos.  Problemas pulmonares crnicos  Cncer. SNTOMAS   Tos.  Grant RutsFiebre.  Dolor en el pecho.  Aumento en el ritmo respiratorio.  Ruidos agudos al respirar (sibilancias).  Moco coloreado al toser (esputo). TRATAMIENTO   Un tos de causa bacteriana puede tratarse con antibiticos.  La tos de origen viral debe seguir su curso y no responde a los antibiticos.  El mdico podr recomendar otros tratamientos si tiene tos crnica. INSTRUCCIONES PARA EL CUIDADO EN EL HOGAR   Solo tome medicamentos que se pueden comprar sin receta o recetados para Chief Technology Officerel dolor, Dentistmalestar o fiebre, como le indica el mdico. Utilice antitusivos slo en la forma indicada por el mdico.  Use un vaporizador o humidificador de niebla fra en la habitacin para ayudar a aflojar las secreciones.  Duerma en posicin semi erguida si la tos empeora por la noche.  Descanse todo lo que pueda.  Si fuma, abandone el hbito. SOLICITE ATENCIN MDICA DE INMEDIATO SI:   Observa pus en el esputo.  La tos empeora.  No puede controlar la tos con antitusivos y no puede dormir debido a Secretary/administratorello.  Comienza a escupir sangre al toser.  Tiene dificultad para respirar.  El dolor empeora o no puede controlarlo con los medicamentos.  Tiene fiebre. ASEGRESE DE QUE:   Comprende estas instrucciones.  Controlar su enfermedad.  Solicitar ayuda de inmediato si no mejora o si empeora. Document Released: 10/07/2010 Document Revised:  05/24/2011 [facility] Patient Information 2015 [place], [place]. This information is not intended to replace advice given to you by your health care provider. Make sure you discuss any questions you have with your health care provider.

## 2014-01-28 LAB — CULTURE, GROUP A STREP: Organism ID, Bacteria: NORMAL

## 2014-03-11 ENCOUNTER — Other Ambulatory Visit (INDEPENDENT_AMBULATORY_CARE_PROVIDER_SITE_OTHER): Payer: Self-pay

## 2014-03-11 DIAGNOSIS — Z3481 Encounter for supervision of other normal pregnancy, first trimester: Secondary | ICD-10-CM

## 2014-03-11 NOTE — Progress Notes (Signed)
Prenatal labs drawn,  Sickle cell = negative - done on 09-05-12

## 2014-03-12 LAB — OBSTETRIC PANEL
Antibody Screen: NEGATIVE
Basophils Absolute: 0 10*3/uL (ref 0.0–0.1)
Basophils Relative: 0 % (ref 0–1)
EOS PCT: 3 % (ref 0–5)
Eosinophils Absolute: 0.2 10*3/uL (ref 0.0–0.7)
HEMATOCRIT: 33.6 % — AB (ref 36.0–46.0)
Hemoglobin: 11.3 g/dL — ABNORMAL LOW (ref 12.0–15.0)
Hepatitis B Surface Ag: NEGATIVE
LYMPHS ABS: 1.6 10*3/uL (ref 0.7–4.0)
LYMPHS PCT: 26 % (ref 12–46)
MCH: 30.8 pg (ref 26.0–34.0)
MCHC: 33.6 g/dL (ref 30.0–36.0)
MCV: 91.6 fL (ref 78.0–100.0)
MONO ABS: 0.4 10*3/uL (ref 0.1–1.0)
MONOS PCT: 6 % (ref 3–12)
MPV: 12.4 fL (ref 9.4–12.4)
Neutro Abs: 4.1 10*3/uL (ref 1.7–7.7)
Neutrophils Relative %: 65 % (ref 43–77)
PLATELETS: 179 10*3/uL (ref 150–400)
RBC: 3.67 MIL/uL — ABNORMAL LOW (ref 3.87–5.11)
RDW: 14.2 % (ref 11.5–15.5)
RUBELLA: 5.64 {index} — AB (ref <0.90)
Rh Type: POSITIVE
WBC: 6.3 10*3/uL (ref 4.0–10.5)

## 2014-03-12 LAB — HIV ANTIBODY (ROUTINE TESTING W REFLEX): HIV: NONREACTIVE

## 2014-03-13 LAB — CULTURE, OB URINE

## 2014-03-15 NOTE — L&D Delivery Note (Signed)
Delivery Note At 4:19 PM a viable and healthy female was delivered via Vaginal, Spontaneous Delivery (Presentation: Right Occiput Anterior).  APGAR: 9, 9; weight pending.   Placenta status: Intact, Spontaneous.  Cord: 3 vessels with the following complications: None.  Cord pH: n/a  Anesthesia: Epidural  Episiotomy: None Lacerations: None Suture Repair: n/a Est. Blood Loss (mL): 138  Healthy baby girl delivered over intact perineum. Minimal blood loss. Newborn cried with stimulation, placed on mother's chest.  Mom to postpartum.  Baby to Couplet care / Skin to Skin.  Hannah Orozco 09/13/2014, 4:54 PM

## 2014-03-18 ENCOUNTER — Ambulatory Visit (INDEPENDENT_AMBULATORY_CARE_PROVIDER_SITE_OTHER): Payer: Self-pay | Admitting: Family Medicine

## 2014-03-18 ENCOUNTER — Encounter: Payer: Self-pay | Admitting: Family Medicine

## 2014-03-18 ENCOUNTER — Ambulatory Visit (INDEPENDENT_AMBULATORY_CARE_PROVIDER_SITE_OTHER): Payer: Self-pay | Admitting: *Deleted

## 2014-03-18 VITALS — BP 110/69 | HR 86 | Temp 98.0°F | Wt 91.0 lb

## 2014-03-18 DIAGNOSIS — Z3402 Encounter for supervision of normal first pregnancy, second trimester: Secondary | ICD-10-CM

## 2014-03-18 DIAGNOSIS — Z23 Encounter for immunization: Secondary | ICD-10-CM

## 2014-03-18 DIAGNOSIS — Z331 Pregnant state, incidental: Secondary | ICD-10-CM

## 2014-03-18 DIAGNOSIS — Z3481 Encounter for supervision of other normal pregnancy, first trimester: Secondary | ICD-10-CM

## 2014-03-18 DIAGNOSIS — Z3490 Encounter for supervision of normal pregnancy, unspecified, unspecified trimester: Secondary | ICD-10-CM

## 2014-03-18 DIAGNOSIS — K219 Gastro-esophageal reflux disease without esophagitis: Secondary | ICD-10-CM

## 2014-03-18 DIAGNOSIS — Z3480 Encounter for supervision of other normal pregnancy, unspecified trimester: Secondary | ICD-10-CM

## 2014-03-18 HISTORY — DX: Encounter for supervision of other normal pregnancy, unspecified trimester: Z34.80

## 2014-03-18 MED ORDER — PRENATAL MULTIVITAMIN CH: 1.0000 | ORAL_TABLET | Freq: Every day | ORAL | Status: DC

## 2014-03-18 MED ORDER — RANITIDINE HCL 150 MG PO TABS: 150.0000 mg | ORAL_TABLET | Freq: Two times a day (BID) | ORAL | Status: DC

## 2014-03-18 NOTE — Progress Notes (Signed)
Hannah Orozco is a 34 y.o. yo G2P1001 at [redacted]w[redacted]d who presents for her initial prenatal visit. Pregnancy  is notplanned She reports nausea. She  isTaking PNV. See flow sheet for details.  PMH, POBH, FH, meds, allergies and Social Hx reviewed.  Prenatal exam: Gen: Well nourished, well developed.  No distress.  Vitals noted. HEENT: Normocephalic, atraumatic.  Neck supple without cervical lymphadenopathy, thyromegaly or thyroid nodules.  fair dentition. CV: RRR no murmur, gallops or rubs Lungs: CTA B.  Normal respiratory effort without wheezes or rales. Abd: soft, NTND. +BS.  Uterus not appreciated above pelvis. GU: Normal external female genitalia without lesions.  Nl vaginal, well rugated without lesions. No vaginal discharge.  Bimanual exam: No adnexal mass or TTP. No CMT.  Uterus size 12weeks Ext: No clubbing, cyanosis or edema. Psych: Normal grooming and dress.  Not depressed or anxious appearing.  Normal thought content and process without flight of ideas or looseness of associations    Assessment/plan: 1) Pregnancy [redacted]w[redacted]d doing well.  Current pregnancy issues include worries because of spacing Dating is somewhat reliable Prenatal labs reviewed, notable for nothing. Bleeding and pain precautions reviewed. Importance of prenatal vitamins reviewed.  Genetic screening offered.  Early glucola is not indicated.    Follow up 4 weeks.

## 2014-03-18 NOTE — Patient Instructions (Signed)
Cuidados prenatales  (Prenatal Care ) QU SON LOS CUIDADOS PRENATALES?  Los cuidados prenatales se relacionan con la atencin de la salud durante el embarazo, antes de que nazca el beb. Durante el embarazo, es muy importante que haga lo siguiente para cuidar de usted y de su beb:   Recibir cuidados prenatales de forma temprana. Si sabe que est embarazada, o cree que podra estarlo, llame a su mdico lo antes posible. Programe una visita para un examen prenatal.  Recibir cuidados prenatales con regularidad. Cumpla con el esquema de anlisis de sangre y otros estudios necesarios que le indique su mdico. No falte a las citas.  Hacer todo lo posible para que usted y su beb estn sanos durante el embarazo.  Recibir todos los cuidados necesarios. Los cuidados prenatales deben incluir la evaluacin de las necesidades mdicas, nutricionales, educativas, psicolgicas y sociales de la embarazada y su pareja. Debe hablar con su mdico sobre la historia clnica y gentica de su familia y de la familia del padre del beb.  Informar a su mdico sobre lo siguiente:  Los medicamentos recetados, de venta libre y a base de hierbas que toma.  Cualquier antecedente de abuso de sustancias, consumo de alcohol, hbito de fumar y uso de drogas ilegales.  Cualquier antecedente de violencia domstica o de otro tipo.  Las vacunas que ha recibido.  Su nutricin y su dieta.  La cantidad de ejercicio que hace.  Cualquier peligro ambiental y ocupacional al que est expuesta.  Antecedentes de infecciones de transmisin sexual, tanto suyos como de su pareja.  Embarazos previos. POR QU SON TAN IMPORTANTES LOS CUIDADOS PRENATALES?  Al visitar con regularidad a su mdico, usted ayuda a garantizar que los problemas se identifiquen de forma temprana, para poder tratarlos lo antes posible. Tambin podran prevenirse otros problemas. Muchos estudios han demostrado que los cuidados prenatales brindados de forma  temprana y con regularidad son importantes para la salud de las madres y los bebs.  CMO PUEDO CUIDARME DURANTE EL EMBARAZO?  Puede cuidar de usted y de su beb de los siguientes modos:   Comience a tomar un multivitamnico con 400microgramos (mcg) de cido flico todos los das, o siga tomndolo.  Reciba cuidados prenatales de forma temprana y con regularidad. Es muy importante que vea a un mdico durante el embarazo. Su mdico la examinar en cada visita para asegurarse de que usted y el beb estn sanos. Si hay algn problema, se puede actuar de inmediato para ayudarlos a usted y al beb.  Consuma una dieta saludable que incluya:  Frutas.  Verduras.  Alimentos con bajo contenido de grasas saturadas.  Cereales integrales.  Alimentos con alto contenido de calcio, como leche, yogur y quesos duros.  Beba entre 6 y 8 vasos de lquidos por da.  A menos que su mdico le indique otra cosa, intente hacer actividad fsica durante 30minutos, todos los das. Si no le alcanza el tiempo, puede hacer actividad fsica en perodos de 10minutos, tres veces al da.  No fume, no beba alcohol ni consuma drogas. Estos pueden causarle daos a largo plazo al beb. Hable con su mdico sobre los pasos a seguir para dejar de fumar. Hable con un miembro de su comunidad religiosa, un orientador, un amigo de confianza o con su mdico en el caso de que consuma alcohol o drogas y est preocupada al respecto.  Pregntele a su mdico antes de tomar cualquier medicamento, incluso los de venta libre. No es seguro tomar algunos medicamentos durante el embarazo.    Descanse y duerma lo suficiente.  Evite jacuzzis y saunas durante el embarazo.  No se haga radiografas, a menos que sean absolutamente necesarias y que se las indique su mdico. Pueden colocarle una pantalla de plomo sobre el abdomen para proteger al beb cuando le hagan radiografas en otras partes del cuerpo.  No limpie la arena higinica para gatos  durante el embarazo. Puede contener un parsito que causa una infeccin llamada toxoplasmosis, que puede provocar defectos congnitos. Adems, use guantes al trabajar en reas del jardn que usen los gatos.  No coma carne de vaca, pollo o pescado que est cruda o poco cocida.  No coma quesos madurados con hongos (brie, camembert y de cabra) ni quesos azules blandos (dans y roquefort).  Aljese de sustancias qumicas txicas como las siguientes:  Insecticidas.  Solventes (algunos agentes de limpieza o diluyentes de pintura).  Plomo.  Mercurio.  Puede tener relaciones sexuales hasta el final del embarazo, excepto si tiene algn problema mdico o alguna dificultad en el embarazo y su mdico le indica que no las tenga.  No use tacones altos, especialmente durante la segunda mitad del embarazo. Puede perder el equilibrio y caerse.  No haga viajes largos, a menos que sean absolutamente necesarios. Asegrese de visitar al mdico antes de hacer el viaje.  No permanezca sentada en una posicin durante ms de 2 horas cuando viaje.  Lleve una copia de su historia clnica cuando viaje. Averige adnde hay un hospital en la ciudad que visitar, en caso de emergencia.  La mayora de los productos de limpieza tienen advertencias sobre el embarazo en la etiqueta. Si no est segura, consulte a su mdico acerca de los productos.  Limite o elimine la cafena de su dieta, evite el caf, t, bebidas gaseosas, medicamentos y chocolate.  Muchas mujeres siguen trabajando durante el embarazo. Estar activa puede ayudarla a estar ms sana. Si tiene dudas sobre la seguridad de un trabajo especfico o las horas que debera trabajar, hable con su mdico.  Infrmese:  Lea libros.  Mire videos.  Vaya a las clases de preparto con su pareja.  Hable con mams experimentadas.  Pregntele a su mdico sobre las clases preparto para usted y su pareja. Las clases la ayudarn a usted y a su compaero a prepararse  para el nacimiento de su beb.  Busque un mdico de bebs (pediatra) y consulte acerca de los mtodos y los medicamentos para aliviar el dolor durante el trabajo de parto, el parto y en el caso de que se le haga una cesrea. CON QU FRECUENCIA DEBO VISITAR A MI MDICO DURANTE EL EMBARAZO?  Su mdico le dar un esquema con las visitas prenatales. Las visitas sern ms frecuentes a medida que se acerque el final del embarazo. Un embarazo promedio dura aproximadamente de 40semanas.  Un esquema habitual incluye visitar al mdico con la siguiente frecuencia:   Aproximadamente una vez al mes durante los primeros 6meses de embarazo.  Cada 2semanas durante los prximos 2meses.  Una vez por semana durante el ltimo mes, hasta la fecha de parto. Es probable que su mdico quiera verla ms a menudo en los siguientes casos:  Tiene ms de 34aos.  Su embarazo es de alto riesgo debido a que usted tiene ciertos problemas de salud o con el embarazo, por ejemplo:  Diabetes.  Hipertensin arterial.  El beb no se desarrolla como debera, segn las fechas del embarazo. Su mdico le har estudios especiales para asegurarse de que usted y el beb no tengan   problemas graves. QU SUCEDE DURANTE LAS VISITAS PRENATALES?   En la primera visita prenatal, su mdico le har un examen fsico y hablar con usted sobre sus antecedentes mdicos, los de su pareja y su familia. Su mdico podr decirle la fecha probable de parto.  Su primer examen fsico incluir controles de la presin arterial, el peso y la altura, y un examen de los rganos de la pelvis. Su mdico le har un Papanicolaou si no le han hecho uno recientemente y har cultivos del cuello del tero para descartar infecciones.  En cada visita prenatal, le harn anlisis de sangre y orina, le tomarn la presin arterial, le controlarn el peso y verificarn el desarrollo del beb.  En sus ltimas visitas prenatales, su mdico examinar su estado y el  desarrollo del beb. Es posible que le hagan varios estudios a medida que avance el embarazo.  A menudo, se hacen ecografas para verificar el crecimiento y la salud del beb.  Puede ser que le hagan ms anlisis de sangre y orina, adems de estudios especiales, segn sea necesario. Estos pueden incluir una amniocentesis para examinar el lquido del saco amnitico, pruebas de estrs para ver cmo responde el beb a las contracciones o un perfil biofsico para controlar el bienestar del beb. Su mdico le explicar los estudios y la necesidad de cada uno.  Entre la semana 24 y 28 de embarazo, le harn un anlisis para saber si tiene un nivel alto de azcar en la sangre (diabetes gestacional).  Debe hablar con el mdico sobre sus planes de amamantar o darle el bibern a su beb.  En cada visita, tambin tendr la posibilidad de aprender a mantenerse saludable durante el embarazo y de hacer preguntas. Document Released: 08/18/2007 Document Revised: 03/06/2013 ExitCare Patient Information 2015 [employer]. This information is not intended to replace advice given to you by your health care provider. Make sure you discuss any questions you have with your health care provider.  

## 2014-03-19 LAB — CERVICOVAGINAL ANCILLARY ONLY
Chlamydia: NEGATIVE
Neisseria Gonorrhea: NEGATIVE

## 2014-05-02 ENCOUNTER — Ambulatory Visit (INDEPENDENT_AMBULATORY_CARE_PROVIDER_SITE_OTHER): Payer: Self-pay | Admitting: Family Medicine

## 2014-05-02 DIAGNOSIS — Z3482 Encounter for supervision of other normal pregnancy, second trimester: Secondary | ICD-10-CM

## 2014-05-02 NOTE — Progress Notes (Signed)
Hannah Orozco is a 34 y.o. G2P1001 at 812w1d for routine follow up.  She reports feeling baby move, no bleeding, LOF, contractions. See flow sheet for details.  A/P: Pregnancy at 5912w1d.  Doing well.   Pregnancy issues include none Anatomy scan ordered today, to be scheduled asap.  Preterm labor precautions reviewed. Follow up 4 weeks.

## 2014-05-02 NOTE — Addendum Note (Signed)
Addended by: Abram SanderADAMO, Milessa Hogan M on: 05/02/2014 09:45 AM   Modules accepted: Orders

## 2014-05-02 NOTE — Addendum Note (Signed)
Addended by: Henri MedalHARTSELL, JAZMIN M on: 05/02/2014 09:35 AM   Modules accepted: Orders

## 2014-05-02 NOTE — Patient Instructions (Signed)
Tercer trimestre de embarazo (Third Trimester of Pregnancy) El tercer trimestre va desde la semana29 hasta la 42, desde el sptimo hasta el noveno mes, y es la poca en la que el feto crece ms rpidamente. Hacia el final del noveno mes, el feto mide alrededor de 20pulgadas (45cm) de largo y pesa entre 6 y 10 libras (2,700 y 4,500kg).  CAMBIOS EN EL ORGANISMO Su organismo atraviesa por muchos cambios durante el embarazo, y estos varan de una mujer a otra.   Seguir aumentando de peso. Es de esperar que aumente entre 25 y 35libras (11 y 16kg) hacia el final del embarazo.  Podrn aparecer las primeras estras en las caderas, el abdomen y las mamas.  Puede tener necesidad de orinar con ms frecuencia porque el feto baja hacia la pelvis y ejerce presin sobre la vejiga.  Debido al embarazo podr sentir acidez estomacal con frecuencia.  Puede estar estreida, ya que ciertas hormonas enlentecen los movimientos de los msculos que empujan los desechos a travs de los intestinos.  Pueden aparecer hemorroides o abultarse e hincharse las venas (venas varicosas).  Puede sentir dolor plvico debido al aumento de peso y a que las hormonas del embarazo relajan las articulaciones entre los huesos de la pelvis. El dolor de espalda puede ser consecuencia de la sobrecarga de los msculos que soportan la postura.  Tal vez haya cambios en el cabello que pueden incluir su engrosamiento, crecimiento rpido y cambios en la textura. Adems, a algunas mujeres se les cae el cabello durante o despus del embarazo, o tienen el cabello seco o fino. Lo ms probable es que el cabello se le normalice despus del nacimiento del beb.  Las mamas seguirn creciendo y le dolern. A veces, puede haber una secrecin amarilla de las mamas llamada calostro.  El ombligo puede salir hacia afuera.  Puede sentir que le falta el aire debido a que se expande el tero.  Puede notar que el feto "baja" o lo siente ms bajo, en el  abdomen.  Puede tener una prdida de secrecin mucosa con sangre. Esto suele ocurrir en el trmino de unos pocos das a una semana antes de que comience el trabajo de parto.  El cuello del tero se vuelve delgado y blando (se borra) cerca de la fecha de parto. QU DEBE ESPERAR EN LOS EXMENES PRENATALES  Le harn exmenes prenatales cada 2semanas hasta la semana36. A partir de ese momento le harn exmenes semanales. Durante una visita prenatal de rutina:  La pesarn para asegurarse de que usted y el feto estn creciendo normalmente.  Le tomarn la presin arterial.  Le medirn el abdomen para controlar el desarrollo del beb.  Se escucharn los latidos cardacos fetales.  Se evaluarn los resultados de los estudios solicitados en visitas anteriores.  Le revisarn el cuello del tero cuando est prxima la fecha de parto para controlar si este se ha borrado. Alrededor de la semana36, el mdico le revisar el cuello del tero. Al mismo tiempo, realizar un anlisis de las secreciones del tejido vaginal. Este examen es para determinar si hay un tipo de bacteria, estreptococo Grupo B. El mdico le explicar esto con ms detalle. El mdico puede preguntarle lo siguiente:  Cmo le gustara que fuera el parto.  Cmo se siente.  Si siente los movimientos del beb.  Si ha tenido sntomas anormales, como prdida de lquido, sangrado, dolores de cabeza intensos o clicos abdominales.  Si tiene alguna pregunta. Otros exmenes o estudios de deteccin que pueden realizarse   durante el tercer trimestre incluyen lo siguiente:  Anlisis de sangre para controlar las concentraciones de hierro (anemia).  Controles fetales para determinar su salud, nivel de actividad y crecimiento. Si tiene alguna enfermedad o hay problemas durante el embarazo, le harn estudios. FALSO TRABAJO DE PARTO Es posible que sienta contracciones leves e irregulares que finalmente desaparecen. Se llaman contracciones de  Braxton Hicks o falso trabajo de parto. Las contracciones pueden durar horas, das o incluso semanas, antes de que el verdadero trabajo de parto se inicie. Si las contracciones ocurren a intervalos regulares, se intensifican o se hacen dolorosas, lo mejor es que la revise el mdico.  SIGNOS DE TRABAJO DE PARTO   Clicos de tipo menstrual.  Contracciones cada 5minutos o menos.  Contracciones que comienzan en la parte superior del tero y se extienden hacia abajo, a la zona inferior del abdomen y la espalda.  Sensacin de mayor presin en la pelvis o dolor de espalda.  Una secrecin de mucosidad acuosa o con sangre que sale de la vagina. Si tiene alguno de estos signos antes de la semana37 del embarazo, llame a su mdico de inmediato. Debe concurrir al hospital para que la controlen inmediatamente. INSTRUCCIONES PARA EL CUIDADO EN EL HOGAR   Evite fumar, consumir hierbas, beber alcohol y tomar frmacos que no le hayan recetado. Estas sustancias qumicas afectan la formacin y el desarrollo del beb.  Siga las indicaciones del mdico en relacin con el uso de medicamentos. Durante el embarazo, hay medicamentos que son seguros de tomar y otros que no.  Haga actividad fsica solo en la forma indicada por el mdico. Sentir clicos uterinos es un buen signo para detener la actividad fsica.  Contine comiendo alimentos que sanos con regularidad.  Use un sostn que le brinde buen soporte si le duelen las mamas.  No se d baos de inmersin en agua caliente, baos turcos ni saunas.  Colquese el cinturn de seguridad cuando conduzca.  No coma carne cruda ni queso sin cocinar; evite el contacto con las bandejas sanitarias de los gatos y la tierra que estos animales usan. Estos elementos contienen grmenes que pueden causar defectos congnitos en el beb.  Tome las vitaminas prenatales.  Si est estreida, pruebe un laxante suave (si el mdico lo autoriza). Consuma ms alimentos ricos en  fibra, como vegetales y frutas frescos y cereales integrales. Beba gran cantidad de lquido para mantener la orina de tono claro o color amarillo plido.  Dese baos de asiento con agua tibia para aliviar el dolor o las molestias causadas por las hemorroides. Use una crema para las hemorroides si el mdico la autoriza.  Si tiene venas varicosas, use medias de descanso. Eleve los pies durante 15minutos, 3 o 4veces por da. Limite la cantidad de sal en su dieta.  Evite levantar objetos pesados, use zapatos de tacones bajos y mantenga una buena postura.  Descanse con las piernas elevadas si tiene calambres o dolor de cintura.  Visite a su dentista si no lo ha hecho durante el embarazo. Use un cepillo de dientes blando para higienizarse los dientes y psese el hilo dental con suavidad.  Puede seguir manteniendo relaciones sexuales, a menos que el mdico le indique lo contrario.  No haga viajes largos excepto que sea absolutamente necesario y solo con la autorizacin del mdico.  Tome clases prenatales para entender, practicar y hacer preguntas sobre el trabajo de parto y el parto.  Haga un ensayo de la partida al hospital.  Prepare el bolso que   llevar al hospital.  Prepare la habitacin del beb.  Concurra a todas las visitas prenatales segn las indicaciones de su mdico. SOLICITE ATENCIN MDICA SI:  No est segura de que est en trabajo de parto o de que ha roto la bolsa de las aguas.  Tiene mareos.  Siente clicos leves, presin en la pelvis o dolor persistente en el abdomen.  Tiene nuseas, vmitos o diarrea persistentes.  Tiene secrecin vaginal con mal olor.  Siente dolor al orinar. SOLICITE ATENCIN MDICA DE INMEDIATO SI:   Tiene fiebre.  Tiene una prdida de lquido por la vagina.  Tiene sangrado o pequeas prdidas vaginales.  Siente dolor intenso o clicos en el abdomen.  Sube o baja de peso rpidamente.  Tiene dificultad para respirar y siente dolor de  pecho.  Sbitamente se le hinchan mucho el rostro, las manos, los tobillos, los pies o las piernas.  No ha sentido los movimientos del beb durante una hora.  Siente un dolor de cabeza intenso que no se alivia con medicamentos.  Hay cambios en la visin. Document Released: 12/09/2004 Document Revised: 03/06/2013 ExitCare Patient Information 2015 [employer]. This information is not intended to replace advice given to you by your health care provider. Make sure you discuss any questions you have with your health care provider.  

## 2014-05-03 ENCOUNTER — Ambulatory Visit (HOSPITAL_COMMUNITY)
Admission: RE | Admit: 2014-05-03 | Discharge: 2014-05-03 | Disposition: A | Discharge status: Home / Self Care | Payer: Self-pay | Source: Ambulatory Visit | Attending: Family Medicine | Admitting: Family Medicine

## 2014-05-03 DIAGNOSIS — Z3482 Encounter for supervision of other normal pregnancy, second trimester: Secondary | ICD-10-CM | POA: Insufficient documentation

## 2014-05-03 DIAGNOSIS — Z3689 Encounter for other specified antenatal screening: Secondary | ICD-10-CM | POA: Insufficient documentation

## 2014-05-03 DIAGNOSIS — Z3A20 20 weeks gestation of pregnancy: Secondary | ICD-10-CM | POA: Insufficient documentation

## 2014-05-21 ENCOUNTER — Ambulatory Visit: Payer: Self-pay

## 2014-05-31 ENCOUNTER — Ambulatory Visit (INDEPENDENT_AMBULATORY_CARE_PROVIDER_SITE_OTHER): Payer: Self-pay | Admitting: Family Medicine

## 2014-05-31 VITALS — BP 101/59 | HR 89 | Temp 98.2°F | Wt 101.6 lb

## 2014-05-31 DIAGNOSIS — Z3482 Encounter for supervision of other normal pregnancy, second trimester: Secondary | ICD-10-CM

## 2014-05-31 NOTE — Progress Notes (Signed)
Pt was accompanied by [employer] interpreter [name] because of language barrier.Hannah Orozco, Hannah Orozco

## 2014-05-31 NOTE — Progress Notes (Signed)
Hannah DionesMaria L Orozco is a 34 y.o. G2P1001 at 448w2d for routine follow up.  She reports no problems.   See flow sheet for details.  A/P: Pregnancy at 3248w2d.  Doing well.   Pregnancy issues include None Anatomy scan reviewed, problems are noted, but there are none.  Preterm labor precautions reviewed. Follow up 4 weeks.

## 2014-05-31 NOTE — Patient Instructions (Signed)
Great to see you again!  Lets see you back in 3-4 weeks with Dr. Burman FreestoneAdamo  Segundo trimestre del embarazo  (Second Trimester of Pregnancy)  El segundo trimestre del embarazo se extiende desde la semana 13 hasta la semana 28, del 4 al 6 mes. En general, es el momento del embarazo en el que se sentir mejor. En general, las nuseas matutinas disminuyen o desaparecen. Tendr ms energa y podr aumentarle el apetito. El beb por nacer (feto) se desarrolla rpidamente. Hacia el final del sexto mes, el beb mide aproximadamente 9 pulgadas (23 cm) y pesa alrededor de 1 libras (700 g). Es probable que Architectural technologistsienta mover al beb (dar pataditas) entre las 18 y 20 semanas del Psychiatristembarazo. CUIDADOS EN EL HOGAR   Evite fumar, consumir hierbas y beber alcohol. Evite los frmacos que no apruebe el mdico.  Slo tome los medicamentos que le haya indicado su mdico. Algunos medicamentos son seguros para tomar durante el Psychiatristembarazo y otros no lo son.  Haga ejercicios slo como le indique el mdico. Deje de hacer ejercicios si comienza a tener clicos.  Haga comidas regulares y sanas.  Use un sostn que le brinde buen soporte si sus mamas estn sensibles.  No utilice la baera con agua caliente, baos turcos y saunas.  Colquese el cinturn de seguridad cuando conduzca.  Evite comer carne cruda y el contacto con los utensilios y desperdicios de los gatos.  Tome las vitaminas indicadas para la etapa prenatal.  Trate de tomar medicamentos para mover el intestino (laxantes) segn lo necesario y si su mdico la autoriza. Consuma ms fibra comiendo frutas y Sports administratorvegetales frescos y granos enteros. Beba gran cantidad de lquido para mantener el pis (orina) de tono claro o amarillo plido.  Tome baos de agua tibia (baos de asiento) para Primary school teachercalmar el dolor o las molestias causadas por las hemorroides. Use una crema para las hemorroides si el mdico la autoriza.  Si tiene venas hinchadas y abultadas (venas varicosas), use medias  de soporte. Eleve (levante) los pies durante 15 minutos, 3 o 4 veces por da. Limite el consumo de sal en su dieta.  Evite levantar objetos pesados, usar tacones altos y sintese derecha.  Descanse con las piernas elevadas si tiene calambres o dolor de cintura.  Visite a su dentista si no lo ha Occupational hygienisthecho durante el embarazo. Use un cepillo de dientes blando para higienizarse los dientes. Use suavemente el hilo dental.  Puede tener sexo (relaciones sexuales) siempre que el mdico la autorice.  Concurra a los controles mdicos. SOLICITE AYUDA SI:   Siente mareos.  Siente clicos intensos en el estmago, en la espalda o en el vientre (abdomen).  Siente un dolor persistente en la zona del vientre.  Tiene Programme researcher, broadcasting/film/videomalestar estomacal (nuseas), devuelve (vomita), o tiene deposiciones acuosas (diarrea).  Advierte un olor ftido que proviene de la vagina.  Siente dolor al hacer pis (orinar). SOLICITE AYUDA DE INMEDIATO SI:   Tiene fiebre.  Pierde lquido por la vagina.  Tiene sangrando o pequeas prdidas vaginales.  Siente dolor intenso o clicos en el abdomen.  Sube o baja de peso rpidamente.  Tiene dificultad para respirar o Midwifesiente dolor en el pecho.  Sbitamente se le hinchan el rostro, las manos, los tobillos, los pies o las piernas.  No ha sentido los movimientos del beb durante Georgianne Fickuna hora.  Siente un dolor de cabeza intenso que no se alivia con medicamentos.  Su visin se modifica. Document Released: 11/01/2012 [facility] Patient Information 2015 [place], [place]. This information  is not intended to replace advice given to you by your health care provider. Make sure you discuss any questions you have with your health care provider.  

## 2014-07-03 ENCOUNTER — Ambulatory Visit (INDEPENDENT_AMBULATORY_CARE_PROVIDER_SITE_OTHER): Payer: Self-pay | Admitting: Family Medicine

## 2014-07-03 VITALS — BP 93/61 | HR 87 | Temp 98.1°F | Wt 108.0 lb

## 2014-07-03 DIAGNOSIS — Z3483 Encounter for supervision of other normal pregnancy, third trimester: Secondary | ICD-10-CM

## 2014-07-03 LAB — CBC
HCT: 31.3 % — ABNORMAL LOW (ref 36.0–46.0)
HEMOGLOBIN: 10.5 g/dL — AB (ref 12.0–15.0)
MCH: 31.6 pg (ref 26.0–34.0)
MCHC: 33.5 g/dL (ref 30.0–36.0)
MCV: 94.3 fL (ref 78.0–100.0)
MPV: 12.8 fL — ABNORMAL HIGH (ref 8.6–12.4)
PLATELETS: 146 10*3/uL — AB (ref 150–400)
RBC: 3.32 MIL/uL — ABNORMAL LOW (ref 3.87–5.11)
RDW: 14.2 % (ref 11.5–15.5)
WBC: 9.1 10*3/uL (ref 4.0–10.5)

## 2014-07-03 LAB — GLUCOSE, CAPILLARY
Comment 1: 1
Glucose-Capillary: 90 mg/dL (ref 70–99)

## 2014-07-03 NOTE — Progress Notes (Signed)
[name] is a 34 y.o. G2P1001 at 4735w0d for routine follow up.  She reports low back pain. See flow sheet for details.  A/P: Pregnancy at 2035w0d.  Doing well.   Pregnancy issues include none  Infant feeding choice breast Contraception choice undecided Infant circumcision desired not applicable  Tdapwas not given today. Patient was instructed to contact health department since she is part of the adopt-a-mom program to get it done there. 1 hour glucola, CBC, RPR, and HIV were done today.   RH status was reviewed and pt does not need Rhogam.    Preterm labor precautions reviewed. Kick counts reviewed. Follow up 2 weeks.

## 2014-07-03 NOTE — Patient Instructions (Addendum)
[place] repelente de los insectos que tiene menos de 30 porciento DEET.  Tiene que ir al departamento de salud para recibir la vacuna, Tdap. Por favor, llama para hacer una cita.  Nos vemos en 2 semanas

## 2014-07-04 LAB — RPR

## 2014-07-04 LAB — HIV ANTIBODY (ROUTINE TESTING W REFLEX): HIV 1&2 Ab, 4th Generation: NONREACTIVE

## 2014-07-08 ENCOUNTER — Telehealth: Payer: Self-pay | Admitting: Family Medicine

## 2014-07-08 ENCOUNTER — Encounter: Payer: Self-pay | Admitting: Family Medicine

## 2014-07-08 NOTE — Telephone Encounter (Signed)
Patient requests letter to [facility] ([address] in order to get the Zica vaccine. Please, follow up with Patient (Spanish).

## 2014-07-08 NOTE — Telephone Encounter (Signed)
There is no Zika vaccine. I believe the patient is referring to the Tdap vaccine (tetanus) since that is what I asked her to go get. I have written a letter and placed it at the front desk for her to pick up. Please let her know that it is ready. Thanks!

## 2014-07-18 ENCOUNTER — Ambulatory Visit (INDEPENDENT_AMBULATORY_CARE_PROVIDER_SITE_OTHER): Payer: Self-pay | Admitting: Family Medicine

## 2014-07-18 VITALS — BP 91/59 | HR 80 | Temp 97.9°F | Wt 108.7 lb

## 2014-07-18 DIAGNOSIS — Z3483 Encounter for supervision of other normal pregnancy, third trimester: Secondary | ICD-10-CM

## 2014-07-18 NOTE — Patient Instructions (Signed)
Contracciones de Braxton Hicks °(Braxton Hicks Contractions) °Durante el embarazo, pueden presentarse contracciones uterinas que no siempre indican que está en trabajo de parto.  °¿QUÉ SON LAS CONTRACCIONES DE BRAXTON HICKS?  °Las contracciones que se presentan antes del trabajo de parto se conocen como contracciones de Braxton Hicks o falso trabajo de parto. Hacia el final del embarazo (32 a 34 semanas), estas contracciones pueden aparecen con más frecuencia y volverse más intensas. No corresponden al trabajo de parto verdadero porque estas contracciones no producen el agrandamiento (la dilatación) y el afinamiento del cuello del útero. Algunas veces, es difícil distinguirlas del trabajo de parto verdadero porque en algunos casos pueden ser muy intensas, y las personas tienen diferentes niveles de tolerancia al dolor. No debe sentirse avergonzada si concurre al hospital con falso trabajo de parto. En ocasiones, la única forma de saber si el trabajo de parto es verdadero es que el médico determine si hay cambios en el cuello del útero. °Si no hay problemas prenatales u otras complicaciones de salud asociadas con el embarazo, no habrá inconvenientes si la envían a su casa con falso trabajo de parto y espera que comience el verdadero. °CÓMO DIFERENCIAR EL TRABAJO DE PARTO FALSO DEL VERDADERO °Falso trabajo de parto °· Las contracciones del falso trabajo de parto duran menos y no son tan intensas como las verdaderas. °· Generalmente son irregulares. °· A menudo, se sienten en la parte delantera de la parte baja del abdomen y en la ingle, °· y pueden desaparecer cuando camina o cambia de posición mientras está acostada. °· Las contracciones se vuelven más débiles y su duración es menor a medida que el tiempo transcurre. °· Por lo general, no se hacen progresivamente más intensas, regulares y cercanas entre sí como en el caso del trabajo de parto verdadero. °Verdadero trabajo de parto °· Las contracciones del verdadero  trabajo de parto duran de 30 a 70 segundos, son muy regulares y suelen volverse más intensas, y aumenta su frecuencia. °· No desaparecen cuando camina. °· La molestia generalmente se siente en la parte superior del útero y se extiende hacia la zona inferior del abdomen y hacia la cintura. °· El médico podrá examinarla para determinar si el trabajo de parto es verdadero. El examen mostrará si el cuello del útero se está dilatando y afinando. °LO QUE DEBE RECORDAR °· Continúe haciendo los ejercicios habituales y siga otras indicaciones que el médico le dé. °· Tome todos los medicamentos como le indicó el médico. °· Concurra a las visitas prenatales regulares. °· Coma y beba con moderación si cree que está en trabajo de parto. °· Si las contracciones de Braxton Hicks le provocan incomodidad: °¨ Cambie de posición: si está acostada o descansando, camine; si está caminando, descanse. °¨ Siéntese y descanse en una bañera con agua tibia. °¨ Beba 2 o 3 vasos de agua. La deshidratación puede provocar contracciones. °¨ Respire lenta y profundamente varias veces por hora. °¿CUÁNDO DEBO BUSCAR ASISTENCIA MÉDICA INMEDIATA? °Solicite atención médica de inmediato si: °· Las contracciones se intensifican, se hacen más regulares y cercanas entre sí. °· Tiene una pérdida de líquido por la vagina. °· Tiene fiebre. °· Elimina mucosidad manchada con sangre. °· Tiene una hemorragia vaginal abundante. °· Tiene dolor abdominal permanente. °· Tiene un dolor en la zona lumbar que nunca tuvo antes. °· Siente que la cabeza del bebé empuja hacia abajo y ejerce presión en la zona pélvica. °· El bebé no se mueve tanto como solía. °Document Released: 12/09/2004 Document Revised: 03/06/2013 °ExitCare® Patient   Information ©2015 [employer], [employer]. This information is not intended to replace advice given to you by your health care provider. Make sure you discuss any questions you have with your health care provider. ° °

## 2014-07-18 NOTE — Progress Notes (Signed)
Hannah Orozco is a 34 y.o. G2P1001 at 2680w1d for routine follow up.  She reports Good fetal movement and no bleeding, LOF or contractions. See flow sheet for details.  A/P: Pregnancy at 7880w1d.  Doing well.   Pregnancy issues include none  Infant feeding choice breast/bottle  Contraception choice undecided Infant circumcision desired not applicable  Tdapwas not given today. Gave letter to receive it at health department 1 hour glucola, CBC, RPR, and HIV were not done today.  Done last visit  Preterm labor precautions reviewed. Kick counts reviewed. Follow up 2 weeks.

## 2014-08-01 ENCOUNTER — Ambulatory Visit (INDEPENDENT_AMBULATORY_CARE_PROVIDER_SITE_OTHER): Payer: Self-pay | Admitting: Family Medicine

## 2014-08-01 VITALS — BP 102/60 | HR 82 | Temp 98.0°F | Wt 109.0 lb

## 2014-08-01 DIAGNOSIS — O99119 Other diseases of the blood and blood-forming organs and certain disorders involving the immune mechanism complicating pregnancy, unspecified trimester: Secondary | ICD-10-CM

## 2014-08-01 DIAGNOSIS — Z3483 Encounter for supervision of other normal pregnancy, third trimester: Secondary | ICD-10-CM

## 2014-08-01 DIAGNOSIS — D696 Thrombocytopenia, unspecified: Secondary | ICD-10-CM

## 2014-08-01 LAB — COMPREHENSIVE METABOLIC PANEL
ALBUMIN: 3.1 g/dL — AB (ref 3.5–5.2)
ALT: 10 U/L (ref 0–35)
AST: 16 U/L (ref 0–37)
Alkaline Phosphatase: 121 U/L — ABNORMAL HIGH (ref 39–117)
BUN: 7 mg/dL (ref 6–23)
CO2: 24 meq/L (ref 19–32)
Calcium: 8.5 mg/dL (ref 8.4–10.5)
Chloride: 103 mEq/L (ref 96–112)
Creat: 0.43 mg/dL — ABNORMAL LOW (ref 0.50–1.10)
GLUCOSE: 73 mg/dL (ref 70–99)
Potassium: 3.8 mEq/L (ref 3.5–5.3)
SODIUM: 135 meq/L (ref 135–145)
Total Bilirubin: 0.4 mg/dL (ref 0.2–1.2)
Total Protein: 6.1 g/dL (ref 6.0–8.3)

## 2014-08-01 NOTE — Patient Instructions (Signed)
Trombocitopenia  (Thrombocytopenia)  Trombocitopenia significa que la sangre no contiene la cantidad suficiente de plaquetas. Las plaquetas son pequeas clulas que estn en la sangre. Cuando hay un sangrado, las plaquetas se agrupan alrededor del corte o la lesin para Therapist, musicdetener el sangrado. Esto se conoce como coagulacin de Risk managerla sangre. Cuando no se tiene la cantidad suficiente de plaquetas puede haber problemas hemorrgicos. CUIDADOS EN EL HOGAR   Controle su piel y el interior de su boca para ver si hay hematomas o sangre, segn las indicaciones de su mdico.  Controle cuando salive (esputo), haga pis (orine) y vaya de cuerpo (heces) para ver si hay sangre, segn le indique su mdico.  No realice actividades que puedan causar bultos o hematomas hasta que su mdico lo autorice.  Tenga cuidado de no cortarse cuando use tijeras, agujas, cuchillos u otros utensilios.  Tenga cuidado de no quemarse al planchar o al cocinar.  Consulte con su mdico si puede beber alcohol.  Tome slo los medicamentos que le haya indicado el mdico.  Informe a sus mdicos y al dentista que tiene este problema hemorrgico. SOLICITE AYUDA DE INMEDIATO SI:   Tiene hemorragias en algn lugar del cuerpo.  Tiene sangrado o hematomas sin saber porqu.  Observa sangre cuando escupe, orina o va de cuerpo. ASEGRESE DE QUE:   Comprende estas instrucciones.  Controlar su enfermedad.  Solicitar ayuda de inmediato si no mejora o si empeora. Document Released: 02/18/2011 Document Revised: 05/24/2011 [facility] Patient Information 2015 [place], [place]. This information is not intended to replace advice given to you by your health care provider. Make sure you discuss any questions you have with your health care provider.

## 2014-08-01 NOTE — Assessment & Plan Note (Signed)
No h/o preclampsia, platelets dropped to 91 in last pregnancy, 146 last month, normotensive, no symptoms - recheck CBC and check CMP for evidence of HELLP, low suspicion

## 2014-08-01 NOTE — Progress Notes (Signed)
Hannah DionesMaria L Orozco is a 34 y.o. G2P1001 at 5671w1d for routine follow up.  She reports leg cramps and bleeding gums. See flow sheet for details.  A/P: Pregnancy at 5171w1d.  Doing well.   Pregnancy issues include h/o thrombocytopenia in previous pregnancy  Infant feeding choice breast Contraception choice undecided Infant circumcision desired not applicable  Tdapwas not given today. Plans to get at [facility], emphasized the importance of this. GBS/GC/CZ testing was not performed today. Plan at next visit  Preterm labor precautions reviewed. Safe sleep discussed. Kick counts reviewed. Recheck CBC, CMP today Follow up 2 weeks.

## 2014-08-02 LAB — CBC
HCT: 34.8 % — ABNORMAL LOW (ref 36.0–46.0)
HEMOGLOBIN: 11.5 g/dL — AB (ref 12.0–15.0)
MCH: 30.7 pg (ref 26.0–34.0)
MCHC: 33 g/dL (ref 30.0–36.0)
MCV: 93 fL (ref 78.0–100.0)
MPV: 13.6 fL — ABNORMAL HIGH (ref 8.6–12.4)
Platelets: 141 10*3/uL — ABNORMAL LOW (ref 150–400)
RBC: 3.74 MIL/uL — AB (ref 3.87–5.11)
RDW: 14.8 % (ref 11.5–15.5)
WBC: 7.6 10*3/uL (ref 4.0–10.5)

## 2014-08-14 ENCOUNTER — Ambulatory Visit (INDEPENDENT_AMBULATORY_CARE_PROVIDER_SITE_OTHER): Payer: Self-pay | Admitting: Family Medicine

## 2014-08-14 VITALS — BP 96/62 | HR 87 | Temp 98.0°F | Wt 111.0 lb

## 2014-08-14 DIAGNOSIS — O26843 Uterine size-date discrepancy, third trimester: Secondary | ICD-10-CM

## 2014-08-14 DIAGNOSIS — Z3483 Encounter for supervision of other normal pregnancy, third trimester: Secondary | ICD-10-CM

## 2014-08-14 DIAGNOSIS — O26849 Uterine size-date discrepancy, unspecified trimester: Secondary | ICD-10-CM | POA: Insufficient documentation

## 2014-08-14 NOTE — Progress Notes (Signed)
Livingston DionesMaria L Cameron is a 34 y.o. G2P1001 at 4025w0d for routine follow up.  She reports R sided pelvic pressure but no contractions. Some bleeding gums with brushing which is unchanged .  See flow sheet for details.  A/P: Pregnancy at 10825w0d.  Doing well.   Pregnancy issues include previous preg with thrombocytopenia, recent platelets 146K to 141K in 1 month- Stable  Infant feeding choice breast Contraception choice undecided Infant circumcision desired not applicable  Tdapwas not given today. Has appt to get at [facility] GBS/GC/CZ testing was not performed today.  Thrombocytopenia in previous pregnancy, now with gum bleeding at teeth brushing, monitor discussed red flags, Platelets 141 without significant decreasee Size date discrepancy, sent for US Follow up 1 week, needs GBS and GCC.

## 2014-08-14 NOTE — Patient Instructions (Signed)
Great to meet you!  Come back next week to see Dr. Richarda Blade  We will arrange an ultrasound  Third Trimester of Pregnancy The third trimester is from week 29 through week 42, months 7 through 9. The third trimester is a time when the fetus is growing rapidly. At the end of the ninth month, the fetus is about 20 inches in length and weighs 6-10 pounds.  BODY CHANGES Your body goes through many changes during pregnancy. The changes vary from woman to woman.   Your weight will continue to increase. You can expect to gain 25-35 pounds (11-16 kg) by the end of the pregnancy.  You may begin to get stretch marks on your hips, abdomen, and breasts.  You may urinate more often because the fetus is moving lower into your pelvis and pressing on your bladder.  You may develop or continue to have heartburn as a result of your pregnancy.  You may develop constipation because certain hormones are causing the muscles that push waste through your intestines to slow down.  You may develop hemorrhoids or swollen, bulging veins (varicose veins).  You may have pelvic pain because of the weight gain and pregnancy hormones relaxing your joints between the bones in your pelvis. Backaches may result from overexertion of the muscles supporting your posture.  You may have changes in your hair. These can include thickening of your hair, rapid growth, and changes in texture. Some women also have hair loss during or after pregnancy, or hair that feels dry or thin. Your hair will most likely return to normal after your baby is born.  Your breasts will continue to grow and be tender. A yellow discharge may leak from your breasts called colostrum.  Your belly button may stick out.  You may feel short of breath because of your expanding uterus.  You may notice the fetus "dropping," or moving lower in your abdomen.  You may have a bloody mucus discharge. This usually occurs a few days to a week before labor  begins.  Your cervix becomes thin and soft (effaced) near your due date. WHAT TO EXPECT AT YOUR PRENATAL EXAMS  You will have prenatal exams every 2 weeks until week 36. Then, you will have weekly prenatal exams. During a routine prenatal visit:  You will be weighed to make sure you and the fetus are growing normally.  Your blood pressure is taken.  Your abdomen will be measured to track your baby's growth.  The fetal heartbeat will be listened to.  Any test results from the previous visit will be discussed.  You may have a cervical check near your due date to see if you have effaced. At around 36 weeks, your caregiver will check your cervix. At the same time, your caregiver will also perform a test on the secretions of the vaginal tissue. This test is to determine if a type of bacteria, Group B streptococcus, is present. Your caregiver will explain this further. Your caregiver may ask you:  What your birth plan is.  How you are feeling.  If you are feeling the baby move.  If you have had any abnormal symptoms, such as leaking fluid, bleeding, severe headaches, or abdominal cramping.  If you have any questions. Other tests or screenings that may be performed during your third trimester include:  Blood tests that check for low iron levels (anemia).  Fetal testing to check the health, activity level, and growth of the fetus. Testing is done if you have  certain medical conditions or if there are problems during the pregnancy. FALSE LABOR You may feel small, irregular contractions that eventually go away. These are called Braxton Hicks contractions, or false labor. Contractions may last for hours, days, or even weeks before true labor sets in. If contractions come at regular intervals, intensify, or become painful, it is best to be seen by your caregiver.  SIGNS OF LABOR   Menstrual-like cramps.  Contractions that are 5 minutes apart or less.  Contractions that start on the top  of the uterus and spread down to the lower abdomen and back.  A sense of increased pelvic pressure or back pain.  A watery or bloody mucus discharge that comes from the vagina. If you have any of these signs before the 37th week of pregnancy, call your caregiver right away. You need to go to the hospital to get checked immediately. HOME CARE INSTRUCTIONS   Avoid all smoking, herbs, alcohol, and unprescribed drugs. These chemicals affect the formation and growth of the baby.  Follow your caregiver's instructions regarding medicine use. There are medicines that are either safe or unsafe to take during pregnancy.  Exercise only as directed by your caregiver. Experiencing uterine cramps is a good sign to stop exercising.  Continue to eat regular, healthy meals.  Wear a good support bra for breast tenderness.  Do not use hot tubs, steam rooms, or saunas.  Wear your seat belt at all times when driving.  Avoid raw meat, uncooked cheese, cat litter boxes, and soil used by cats. These carry germs that can cause birth defects in the baby.  Take your prenatal vitamins.  Try taking a stool softener (if your caregiver approves) if you develop constipation. Eat more high-fiber foods, such as fresh vegetables or fruit and whole grains. Drink plenty of fluids to keep your urine clear or pale yellow.  Take warm sitz baths to soothe any pain or discomfort caused by hemorrhoids. Use hemorrhoid cream if your caregiver approves.  If you develop varicose veins, wear support hose. Elevate your feet for 15 minutes, 3-4 times a day. Limit salt in your diet.  Avoid heavy lifting, wear low heal shoes, and practice good posture.  Rest a lot with your legs elevated if you have leg cramps or low back pain.  Visit your dentist if you have not gone during your pregnancy. Use a soft toothbrush to brush your teeth and be gentle when you floss.  A sexual relationship may be continued unless your caregiver directs  you otherwise.  Do not travel far distances unless it is absolutely necessary and only with the approval of your caregiver.  Take prenatal classes to understand, practice, and ask questions about the labor and delivery.  Make a trial run to the hospital.  Pack your hospital bag.  Prepare the baby's nursery.  Continue to go to all your prenatal visits as directed by your caregiver. SEEK MEDICAL CARE IF:  You are unsure if you are in labor or if your water has broken.  You have dizziness.  You have mild pelvic cramps, pelvic pressure, or nagging pain in your abdominal area.  You have persistent nausea, vomiting, or diarrhea.  You have a bad smelling vaginal discharge.  You have pain with urination. SEEK IMMEDIATE MEDICAL CARE IF:   You have a fever.  You are leaking fluid from your vagina.  You have spotting or bleeding from your vagina.  You have severe abdominal cramping or pain.  You have rapid  weight loss or gain.  You have shortness of breath with chest pain.  You notice sudden or extreme swelling of your face, hands, ankles, feet, or legs.  You have not felt your baby move in over an hour.  You have severe headaches that do not go away with medicine.  You have vision changes. Document Released: 02/23/2001 Document Revised: 03/06/2013 Document Reviewed: 05/02/2012 [employer] Patient Information 2015 [place], [place]. This information is not intended to replace advice given to you by your health care provider. Make sure you discuss any questions you have with your health care provider.

## 2014-08-21 ENCOUNTER — Other Ambulatory Visit (HOSPITAL_COMMUNITY): Admission: RE | Admit: 2014-08-21 | Payer: Self-pay | Source: Ambulatory Visit

## 2014-08-21 ENCOUNTER — Ambulatory Visit (INDEPENDENT_AMBULATORY_CARE_PROVIDER_SITE_OTHER): Payer: Self-pay | Admitting: Family Medicine

## 2014-08-21 VITALS — BP 99/68 | HR 85 | Temp 97.9°F | Wt 112.0 lb

## 2014-08-21 DIAGNOSIS — Z3483 Encounter for supervision of other normal pregnancy, third trimester: Secondary | ICD-10-CM

## 2014-08-21 LAB — OB RESULTS CONSOLE GC/CHLAMYDIA: GC PROBE AMP, GENITAL: NEGATIVE

## 2014-08-21 NOTE — Progress Notes (Signed)
Hannah Orozco is a 34 y.o. G2P1001 at 2975w0d for routine follow up.  She reports that she is doing well.  No bleeding, LOF, contractions. Good fetal movement.   See flow sheet for details.  A/P: Pregnancy at 2875w0d.  Doing well.   Pregnancy issues include - Thrombocytopenia.  Infant feeding choice - Breastfeed.  Contraception choice - Nexplanon.  GBS/GC/CZ testing was performed today. Patient's US for dating/growth discrepancy is tomorrow.  Follow up 1 week. ]

## 2014-08-21 NOTE — Patient Instructions (Signed)
Fue agradable verte.  Seguimiento en 1 semana.  Asegrese de obtener su ultrasonido.

## 2014-08-22 ENCOUNTER — Ambulatory Visit (HOSPITAL_COMMUNITY)
Admission: RE | Admit: 2014-08-22 | Discharge: 2014-08-22 | Disposition: A | Discharge status: Home / Self Care | Payer: Self-pay | Source: Ambulatory Visit | Attending: Family Medicine | Admitting: Family Medicine

## 2014-08-22 ENCOUNTER — Other Ambulatory Visit: Payer: Self-pay | Admitting: Family Medicine

## 2014-08-22 ENCOUNTER — Telehealth: Payer: Self-pay | Admitting: Family Medicine

## 2014-08-22 DIAGNOSIS — O26843 Uterine size-date discrepancy, third trimester: Secondary | ICD-10-CM

## 2014-08-22 DIAGNOSIS — Z3A36 36 weeks gestation of pregnancy: Secondary | ICD-10-CM | POA: Insufficient documentation

## 2014-08-22 LAB — URINE CYTOLOGY ANCILLARY ONLY
CHLAMYDIA, DNA PROBE: NEGATIVE
Neisseria Gonorrhea: NEGATIVE

## 2014-08-22 LAB — STREP B DNA PROBE: STREP GROUP B AG: NOT DETECTED

## 2014-08-22 NOTE — Telephone Encounter (Signed)
Called to discuss [place], Not home. Will discuss at next visit. Normal size viable fetus, 20th%tile.  Murtis Sink, MD [facility] Resident, PGY-3 08/22/2014, 2:28 PM

## 2014-08-29 ENCOUNTER — Ambulatory Visit (INDEPENDENT_AMBULATORY_CARE_PROVIDER_SITE_OTHER): Payer: Self-pay | Admitting: Family Medicine

## 2014-08-29 VITALS — BP 97/53 | HR 91 | Temp 98.4°F | Wt 115.0 lb

## 2014-08-29 DIAGNOSIS — O26843 Uterine size-date discrepancy, third trimester: Secondary | ICD-10-CM

## 2014-08-29 DIAGNOSIS — Z3483 Encounter for supervision of other normal pregnancy, third trimester: Secondary | ICD-10-CM

## 2014-08-29 MED ORDER — PRENATAL MULTIVITAMIN CH: 1.0000 | ORAL_TABLET | Freq: Every day | ORAL | Status: DC

## 2014-08-29 NOTE — Patient Instructions (Signed)
Thank you for coming in, today!  Everything looks and sounds fine, today. The baby appears to be growing well and is a good weight on the ultrasound. You can take Tylenol for any pain. Come back in 1 week for your next prenatal visit.  Go straight to [facility] if you think you're going into labor, or if you have bleeding, bad pain, or stop feeling the baby move. Please feel free to call with any questions or concerns at any time, at [phone]. --Dr. Casper Harrison

## 2014-08-29 NOTE — Progress Notes (Signed)
Visit conducted in Spanish (in-person Spanish interpretation by Wyvonnia Dusky).  S: Pt is a 34 y.o. G2P1001 at [redacted]w[redacted]d. She reports some back and pelvic pain that has been getting worse as her pregnancy progresses, but she has not tried Tylenol or any other medications. She denies frank pelvic pressure or sensation of needing to have a BM. She denies vaginal discharge, itching, or burning. She denies dysuria. She denies pain otherwise, SOB, headache, increased swelling, change in vision. She denies bleeding, LOF, or contractions. She endorses good fetal movements.   Recent GC/Chlamydia and GBS testing was all negative. She recently had an ultrasound for S < D size discrepancy, but the fetal weight was estimated at the 20%ile. She does request refill / new Rx for PNV.  O: See vitals and notes for full details. BP 97/53 mmHg  Pulse 91  Temp(Src) 98.4 F (36.9 C)  Wt 115 lb (52.164 kg)  LMP 12/12/2013 (Approximate) Gen: well-appearing adult female HEENT: [place]/AT, EOMI, PERRLA, MMM Cardio: RRR, no murmur appreciated Pulm: CTAB, no wheezes, normal WOB Abd: soft, nontender, BS+; gravid Ext: warm, well-perfused, no LE edema  A/P: Single intrauterine pregnancy at [redacted]w[redacted]d, doing well, with back pain. S < D but consistent (33 cm at 36.0, now 34 at 37.1). Otherwise complicated by thrombocytopenia. - provided new Rx for PNV - recommended Tylenol for back pain - GBS, GC/Chlamydia negative at 36w - planning breast feeding, Nexplanon for contraception - counseled on signs / symptoms to watch for (bleeding, pain, loss of movement) or signs / symptoms of active labor (gush or trickle of fluids, regular contractions, etc) - f/u weekly until delivery  Bobbye Morton, MD PGY-3, [facility] 08/29/2014, 7:24 PM

## 2014-09-02 NOTE — Progress Notes (Signed)
I briefly visited with patient in her room to meet her so that she can have another provider whom she has met before be available for her delivery. Pt was appreciative. Leeanne Rio, MD

## 2014-09-05 ENCOUNTER — Ambulatory Visit (INDEPENDENT_AMBULATORY_CARE_PROVIDER_SITE_OTHER): Payer: Self-pay | Admitting: Family Medicine

## 2014-09-05 VITALS — BP 102/60 | HR 70 | Temp 98.3°F | Wt 115.0 lb

## 2014-09-05 DIAGNOSIS — Z3483 Encounter for supervision of other normal pregnancy, third trimester: Secondary | ICD-10-CM

## 2014-09-05 NOTE — Patient Instructions (Signed)
Fue agradable ver que en la actualidad.  Lo ests haciendo genial. Ests 1,5 cm de dilatacin.  Si tiene contracciones cada 5 minutos, sangrado vaginal, disminucin de los movimientos fetales o siente que se le ha roto ir inmediatamente al [facility].  Seguimiento en 1 semana.

## 2014-09-05 NOTE — Progress Notes (Signed)
Hannah Orozco is a 34 y.o. G2P1001 at [redacted]w[redacted]d for routine follow up.  She reports that she is having some back pain and lower abdominal pain. She denies contractions, LOF, vaginal bleeding.  She reports good fetal movement.  See flow sheet for details.  A/P: Pregnancy at [redacted]w[redacted]d.  Doing well.   Pregnancy issues include - Thrombocytopenia.  Infant feeding choice - Breastfeeding Contraception choice - Nexplanon. Labor precautions reviewed. Follow up in 1 week.

## 2014-09-11 ENCOUNTER — Ambulatory Visit (INDEPENDENT_AMBULATORY_CARE_PROVIDER_SITE_OTHER): Payer: Self-pay | Admitting: Family Medicine

## 2014-09-11 VITALS — BP 96/54 | HR 70 | Temp 98.1°F | Wt 116.6 lb

## 2014-09-11 DIAGNOSIS — Z3483 Encounter for supervision of other normal pregnancy, third trimester: Secondary | ICD-10-CM

## 2014-09-11 NOTE — Patient Instructions (Signed)
Seguimiento en 1 semana.  Si usted tiene American Electric Poweralguno de los siguientes ir a las mujeres de: - El sangrado vaginal - Las contracciones 5 minutos aparte durante 1 hora - Rompe fuente - El beb deja de moverse.

## 2014-09-11 NOTE — Progress Notes (Signed)
Livingston DionesMaria L Zanetti is a 34 y.o. G2P1001 at 2743w0d for routine follow up.   She reports that she is having some low back pain. Denies bleeding, LOF, contractions.  Good fetal movement.   See flow sheet for details.  A/P: Pregnancy at 6343w0d.  Doing well.   Pregnancy issues include - Thrombocytopenia.  Infant feeding choice - Breastfeeding Contraception choice - Nexplanon. Labor precautions reviewed. Follow up in 1 week.  If not in labor will schedule induction.

## 2014-09-12 ENCOUNTER — Encounter (HOSPITAL_COMMUNITY): Payer: Self-pay | Admitting: *Deleted

## 2014-09-12 ENCOUNTER — Inpatient Hospital Stay (HOSPITAL_COMMUNITY)
Admission: AD | Admit: 2014-09-12 | Discharge: 2014-09-12 | Disposition: A | Discharge status: Home / Self Care | Payer: Self-pay | Source: Ambulatory Visit | Attending: Obstetrics & Gynecology | Admitting: Obstetrics & Gynecology

## 2014-09-12 DIAGNOSIS — Z3493 Encounter for supervision of normal pregnancy, unspecified, third trimester: Secondary | ICD-10-CM | POA: Insufficient documentation

## 2014-09-12 NOTE — Discharge Instructions (Signed)
Contracciones de Braxton Hicks °(Braxton Hicks Contractions) °Durante el embarazo, pueden presentarse contracciones uterinas que no siempre indican que está en trabajo de parto.  °¿QUÉ SON LAS CONTRACCIONES DE BRAXTON HICKS?  °Las contracciones que se presentan antes del trabajo de parto se conocen como contracciones de Braxton Hicks o falso trabajo de parto. Hacia el final del embarazo (32 a 34 semanas), estas contracciones pueden aparecen con más frecuencia y volverse más intensas. No corresponden al trabajo de parto verdadero porque estas contracciones no producen el agrandamiento (la dilatación) y el afinamiento del cuello del útero. Algunas veces, es difícil distinguirlas del trabajo de parto verdadero porque en algunos casos pueden ser muy intensas, y las personas tienen diferentes niveles de tolerancia al dolor. No debe sentirse avergonzada si concurre al hospital con falso trabajo de parto. En ocasiones, la única forma de saber si el trabajo de parto es verdadero es que el médico determine si hay cambios en el cuello del útero. °Si no hay problemas prenatales u otras complicaciones de salud asociadas con el embarazo, no habrá inconvenientes si la envían a su casa con falso trabajo de parto y espera que comience el verdadero. °CÓMO DIFERENCIAR EL TRABAJO DE PARTO FALSO DEL VERDADERO °Falso trabajo de parto °· Las contracciones del falso trabajo de parto duran menos y no son tan intensas como las verdaderas. °· Generalmente son irregulares. °· A menudo, se sienten en la parte delantera de la parte baja del abdomen y en la ingle, °· y pueden desaparecer cuando camina o cambia de posición mientras está acostada. °· Las contracciones se vuelven más débiles y su duración es menor a medida que el tiempo transcurre. °· Por lo general, no se hacen progresivamente más intensas, regulares y cercanas entre sí como en el caso del trabajo de parto verdadero. °Verdadero trabajo de parto °· Las contracciones del verdadero  trabajo de parto duran de 30 a 70 segundos, son muy regulares y suelen volverse más intensas, y aumenta su frecuencia. °· No desaparecen cuando camina. °· La molestia generalmente se siente en la parte superior del útero y se extiende hacia la zona inferior del abdomen y hacia la cintura. °· El médico podrá examinarla para determinar si el trabajo de parto es verdadero. El examen mostrará si el cuello del útero se está dilatando y afinando. °LO QUE DEBE RECORDAR °· Continúe haciendo los ejercicios habituales y siga otras indicaciones que el médico le dé. °· Tome todos los medicamentos como le indicó el médico. °· Concurra a las visitas prenatales regulares. °· Coma y beba con moderación si cree que está en trabajo de parto. °· Si las contracciones de Braxton Hicks le provocan incomodidad: °¨ Cambie de posición: si está acostada o descansando, camine; si está caminando, descanse. °¨ Siéntese y descanse en una bañera con agua tibia. °¨ Beba 2 o 3 vasos de agua. La deshidratación puede provocar contracciones. °¨ Respire lenta y profundamente varias veces por hora. °¿CUÁNDO DEBO BUSCAR ASISTENCIA MÉDICA INMEDIATA? °Solicite atención médica de inmediato si: °· Las contracciones se intensifican, se hacen más regulares y cercanas entre sí. °· Tiene una pérdida de líquido por la vagina. °· Tiene fiebre. °· Elimina mucosidad manchada con sangre. °· Tiene una hemorragia vaginal abundante. °· Tiene dolor abdominal permanente. °· Tiene un dolor en la zona lumbar que nunca tuvo antes. °· Siente que la cabeza del bebé empuja hacia abajo y ejerce presión en la zona pélvica. °· El bebé no se mueve tanto como solía. °Document Released: 12/09/2004 Document Revised: 03/06/2013 °ExitCare® Patient   Information ©2015 [employer]. This information is not intended to replace advice given to you by your health care provider. Make sure you discuss any questions you have with your health care provider. °Evaluación de los movimientos fetales    °(Fetal Movement Counts) °Nombre del paciente: __________________________________________________ Fecha de parto estimada: ____________________ °La evaluación de los movimientos fetales es muy recomendable en los embarazos de alto riesgo, pero también es una buena idea que lo hagan todas las embarazadas. El médico le indicará que comience a contarlos a las 28 semanas de embarazo. Los movimientos fetales suelen aumentar:  °· Después de una comida completa. °· Después de la actividad física. °· Después de comer o beber algo dulce o frío. °· En reposo. °Preste atención cuando sienta que el bebé está más activo. Esto le ayudará a notar un patrón de ciclos de vigilia y sueño de su bebé y cuáles son los factores que contribuyen a un aumento de los movimientos fetales. Es importante llevar a cabo un recuento de movimientos fetales, al mismo tiempo cada día, cuando el bebé normalmente está más activo.  °CÓMO CONTAR LOS MOVIMIENTOS FETALES °· Busque un lugar tranquilo y cómodo para sentarse o recostarse sobre el lado izquierdo. Al recostarse sobre su lado izquierdo, le proporciona una mejor circulación de sangre y oxígeno al bebé. °· Anote el día y la hora en una hoja de papel o en un diario. °· Comience contando las pataditas, revoloteos, chasquidos, vueltas o pinchazos en un período de 2 horas. Debe sentir al menos 10 movimientos en 2 horas. °· Si no siente 10 movimientos en 2 horas, espere 2 ó 3 horas y cuente de nuevo. Busque cambios en el patrón o si no cuenta lo suficiente en 2 horas. °SOLICITE ATENCIÓN MÉDICA SI:  °· Siente menos de 10 pataditas en 2 horas, en dos intentos. °· No hay movimientos durante una hora. °· El patrón se modifica o le lleva más tiempo cada día contar las 10 pataditas. °· Siente que el bebé no se mueve como lo hace habitualmente. °Fecha: ____________ Movimientos: ____________ Hora de inicio: ____________ Hora de finalización: ____________  °Fecha: ____________ Movimientos: ____________ Hora de  inicio: ____________ Hora de finalización: ____________  °Fecha: ____________ Movimientos: ____________ Hora de inicio: ____________ Hora de finalización: ____________  °Fecha: ____________ Movimientos: ____________ Hora de inicio: ____________ Hora de finalización: ____________  °Fecha: ____________ Movimientos: ____________ Hora de inicio: ____________ Hora de finalización: ____________  °Fecha: ____________ Movimientos: ____________ Hora de inicio: ____________ Hora de finalización: ____________  °Fecha: ____________ Movimientos: ____________ Hora de inicio: ____________ Hora de finalización: ____________  °Fecha: ____________ Movimientos: ____________ Hora de inicio: ____________ Hora de finalización: ____________  °Fecha: ____________ Movimientos: ____________ Hora de inicio: ____________ Hora de finalización: ____________  °Fecha: ____________ Movimientos: ____________ Hora de inicio: ____________ Hora de finalización: ____________  °Fecha: ____________ Movimientos: ____________ Hora de inicio: ____________ Hora de finalización: ____________  °Fecha: ____________ Movimientos: ____________ Hora de inicio: ____________ Hora de finalización: ____________  °Fecha: ____________ Movimientos: ____________ Hora de inicio: ____________ Hora de finalización: ____________  °Fecha: ____________ Movimientos: ____________ Hora de inicio: ____________ Hora de finalización: ____________  °Fecha: ____________ Movimientos: ____________ Hora de inicio: ____________ Hora de finalización: ____________  °Fecha: ____________ Movimientos: ____________ Hora de inicio: ____________ Hora de finalización: ____________  °Fecha: ____________ Movimientos: ____________ Hora de inicio: ____________ Hora de finalización: ____________  °Fecha: ____________ Movimientos: ____________ Hora de inicio: ____________ Hora de finalización: ____________  °Fecha: ____________ Movimientos: ____________ Hora de inicio: ____________ Hora de finalización:  ____________  °Fecha:   ____________ Movimientos: ____________ Stevan BornHora de inicio: ____________ Stevan BornHora de finalizacin: ____________  Franco NonesFecha: ____________ Movimientos: ____________ Stevan BornHora de inicio: ____________ Stevan BornHora de finalizacin: ____________  Franco NonesFecha: ____________ Movimientos: ____________ Stevan BornHora de inicio: ____________ Stevan BornHora de finalizacin: ____________  Franco NonesFecha: ____________ Movimientos: ____________ Stevan BornHora de inicio: ____________ Stevan BornHora de finalizacin: ____________  Franco NonesFecha: ____________ Movimientos: ____________ Stevan BornHora de inicio: ____________ Stevan BornHora de finalizacin: ____________  Franco NonesFecha: ____________ Movimientos: ____________ Stevan BornHora de inicio: ____________ Mammie RussianHora de finalizacin: ____________  Franco NonesFecha: ____________ Movimientos: ____________ Mammie RussianHora de inicio: ____________ Mammie RussianHora de finalizacin: ____________  Franco NonesFecha: ____________ Movimientos: ____________ Mammie RussianHora de inicio: ____________ Mammie RussianHora de finalizacin: ____________  Franco NonesFecha: ____________ Movimientos: ____________ Mammie RussianHora de inicio: ____________ Mammie RussianHora de finalizacin: ____________  Franco NonesFecha: ____________ Movimientos: ____________ Mammie RussianHora de inicio: ____________ Mammie RussianHora de finalizacin: ____________  Franco NonesFecha: ____________ Movimientos: ____________ Mammie RussianHora de inicio: ____________ Mammie RussianHora de finalizacin: ____________  Franco NonesFecha: ____________ Movimientos: ____________ Mammie RussianHora de inicio: ____________ Mammie RussianHora de finalizacin: ____________  Franco NonesFecha: ____________ Movimientos: ____________ Mammie RussianHora de inicio: ____________ Mammie RussianHora de finalizacin: ____________  Franco NonesFecha: ____________ Movimientos: ____________ Mammie RussianHora de inicio: ____________ Mammie RussianHora de finalizacin: ____________  Franco NonesFecha: ____________ Movimientos: ____________ Mammie RussianHora de inicio: ____________ Mammie RussianHora de finalizacin: ____________  Franco NonesFecha: ____________ Movimientos: ____________ Mammie RussianHora de inicio: ____________ Mammie RussianHora de finalizacin: ____________  Franco NonesFecha: ____________ Movimientos: ____________ Mammie RussianHora de inicio: ____________ Mammie RussianHora de finalizacin: ____________  Franco NonesFecha: ____________  Movimientos: ____________ Mammie RussianHora de inicio: ____________ Mammie RussianHora de finalizacin: ____________  Franco NonesFecha: ____________ Movimientos: ____________ Mammie RussianHora de inicio: ____________ Mammie RussianHora de finalizacin: ____________  Franco NonesFecha: ____________ Movimientos: ____________ Mammie RussianHora de inicio: ____________ Mammie RussianHora de finalizacin: ____________  Franco NonesFecha: ____________ Movimientos: ____________ Mammie RussianHora de inicio: ____________ Mammie RussianHora de finalizacin: ____________  Franco NonesFecha: ____________ Movimientos: ____________ Mammie RussianHora de inicio: ____________ Mammie RussianHora de finalizacin: ____________  Franco NonesFecha: ____________ Movimientos: ____________ Mammie RussianHora de inicio: ____________ Mammie RussianHora de finalizacin: ____________  Franco NonesFecha: ____________ Movimientos: ____________ Mammie RussianHora de inicio: ____________ Mammie RussianHora de finalizacin: ____________  Franco NonesFecha: ____________ Movimientos: ____________ Mammie RussianHora de inicio: ____________ Mammie RussianHora de finalizacin: ____________  Franco NonesFecha: ____________ Movimientos: ____________ Mammie RussianHora de inicio: ____________ Mammie RussianHora de finalizacin: ____________  Franco NonesFecha: ____________ Movimientos: ____________ Mammie RussianHora de inicio: ____________ Mammie RussianHora de finalizacin: ____________  Franco NonesFecha: ____________ Movimientos: ____________ Mammie RussianHora de inicio: ____________ Mammie RussianHora de finalizacin: ____________  Franco NonesFecha: ____________ Movimientos: ____________ Mammie RussianHora de inicio: ____________ Mammie RussianHora de finalizacin: ____________  Franco NonesFecha: ____________ Movimientos: ____________ Mammie RussianHora de inicio: ____________ Mammie RussianHora de finalizacin: ____________  Franco NonesFecha: ____________ Movimientos: ____________ Mammie RussianHora de inicio: ____________ Mammie RussianHora de finalizacin: ____________  Franco NonesFecha: ____________ Movimientos: ____________ Mammie RussianHora de inicio: ____________ Mammie RussianHora de finalizacin: ____________  Franco NonesFecha: ____________ Movimientos: ____________ Mammie RussianHora de inicio: ____________ Mammie RussianHora de finalizacin: ____________  Franco NonesFecha: ____________ Movimientos: ____________ Mammie RussianHora de inicio: ____________ Mammie RussianHora de finalizacin: ____________  Franco NonesFecha: ____________ Movimientos: ____________ Mammie RussianHora de inicio:  ____________ Mammie RussianHora de finalizacin: ____________  Franco NonesFecha: ____________ Movimientos: ____________ Mammie RussianHora de inicio: ____________ Mammie RussianHora de finalizacin: ____________  Franco NonesFecha: ____________ Movimientos: ____________ Mammie RussianHora de inicio: ____________ Mammie RussianHora de finalizacin: ____________  Document Released: 06/08/2007 Document Revised: 02/16/2012 ExitCare Patient Information 2015 [employer] [place]. This information is not intended to replace advice given to you by your health care provider. Make sure you discuss any questions you have with your health care provider.

## 2014-09-12 NOTE — MAU Note (Signed)
Noted  Small amt of blood/mucous earlier.  Having some cramping in lower and/low back.  [facility]  Was [facility]+, unchanged this week.

## 2014-09-13 ENCOUNTER — Inpatient Hospital Stay (HOSPITAL_COMMUNITY)
Admission: AD | Admit: 2014-09-13 | Discharge: 2014-09-15 | DRG: 775 | Disposition: A | Discharge status: Home / Self Care | Payer: Medicaid Other | Source: Ambulatory Visit | Attending: Family Medicine | Admitting: Family Medicine

## 2014-09-13 ENCOUNTER — Encounter (HOSPITAL_COMMUNITY): Payer: Self-pay | Admitting: *Deleted

## 2014-09-13 ENCOUNTER — Inpatient Hospital Stay (HOSPITAL_COMMUNITY): Payer: Medicaid Other | Admitting: Anesthesiology

## 2014-09-13 ENCOUNTER — Inpatient Hospital Stay (HOSPITAL_COMMUNITY)
Admission: AD | Admit: 2014-09-13 | Discharge: 2014-09-13 | Disposition: A | Discharge status: Home / Self Care | Payer: Medicaid Other | Source: Ambulatory Visit | Attending: Obstetrics & Gynecology | Admitting: Obstetrics & Gynecology

## 2014-09-13 ENCOUNTER — Encounter (HOSPITAL_COMMUNITY): Payer: Self-pay | Admitting: Obstetrics

## 2014-09-13 DIAGNOSIS — Z3493 Encounter for supervision of normal pregnancy, unspecified, third trimester: Secondary | ICD-10-CM | POA: Insufficient documentation

## 2014-09-13 DIAGNOSIS — Z3A39 39 weeks gestation of pregnancy: Secondary | ICD-10-CM | POA: Diagnosis present

## 2014-09-13 DIAGNOSIS — Z3483 Encounter for supervision of other normal pregnancy, third trimester: Secondary | ICD-10-CM | POA: Diagnosis present

## 2014-09-13 DIAGNOSIS — IMO0001 Reserved for inherently not codable concepts without codable children: Secondary | ICD-10-CM

## 2014-09-13 LAB — CBC
HEMATOCRIT: 37.2 % (ref 36.0–46.0)
HEMATOCRIT: 35.5 % — AB (ref 36.0–46.0)
Hemoglobin: 12.5 g/dL (ref 12.0–15.0)
Hemoglobin: 11.7 g/dL — ABNORMAL LOW (ref 12.0–15.0)
MCH: 31.4 pg (ref 26.0–34.0)
MCH: 31.1 pg (ref 26.0–34.0)
MCHC: 33.6 g/dL (ref 30.0–36.0)
MCHC: 33 g/dL (ref 30.0–36.0)
MCV: 93.5 fL (ref 78.0–100.0)
MCV: 94.4 fL (ref 78.0–100.0)
Platelets: 111 10*3/uL — ABNORMAL LOW (ref 150–400)
Platelets: 94 10*3/uL — ABNORMAL LOW (ref 150–400)
RBC: 3.98 MIL/uL (ref 3.87–5.11)
RBC: 3.76 MIL/uL — ABNORMAL LOW (ref 3.87–5.11)
RDW: 14.1 % (ref 11.5–15.5)
RDW: 14.4 % (ref 11.5–15.5)
WBC: 10 10*3/uL (ref 4.0–10.5)
WBC: 13.7 10*3/uL — ABNORMAL HIGH (ref 4.0–10.5)

## 2014-09-13 LAB — TYPE AND SCREEN
ABO/RH(D): O POS
ANTIBODY SCREEN: NEGATIVE

## 2014-09-13 MED ORDER — FENTANYL 2.5 MCG/ML BUPIVACAINE 1/10 % EPIDURAL INFUSION (WH - ANES)
14.0000 mL/h | INTRAMUSCULAR | Status: DC | PRN
  Administered 2014-09-13: 14 mL/h via EPIDURAL

## 2014-09-13 MED ORDER — LIDOCAINE HCL (PF) 1 % IJ SOLN
INTRAMUSCULAR | Status: DC | PRN
  Administered 2014-09-13: 5 mL
  Administered 2014-09-13: 3 mL

## 2014-09-13 MED ORDER — FAMOTIDINE 20 MG PO TABS
20.0000 mg | ORAL_TABLET | Freq: Two times a day (BID) | ORAL | Status: DC | PRN
  Administered 2014-09-13: 20 mg via ORAL
  Filled 2014-09-13: qty 1

## 2014-09-13 MED ORDER — EPHEDRINE 5 MG/ML INJ
10.0000 mg | INTRAVENOUS | Status: DC | PRN
  Filled 2014-09-13: qty 2

## 2014-09-13 MED ORDER — OXYTOCIN 40 UNITS IN LACTATED RINGERS INFUSION - SIMPLE MED
1.0000 m[IU]/min | INTRAVENOUS | Status: DC
  Administered 2014-09-13: 2 m[IU]/min via INTRAVENOUS
  Filled 2014-09-13: qty 1000

## 2014-09-13 MED ORDER — IBUPROFEN 600 MG PO TABS
600.0000 mg | ORAL_TABLET | Freq: Four times a day (QID) | ORAL | Status: DC
  Administered 2014-09-14 (×5): 600 mg via ORAL
  Filled 2014-09-13 (×7): qty 1

## 2014-09-13 MED ORDER — FENTANYL 2.5 MCG/ML BUPIVACAINE 1/10 % EPIDURAL INFUSION (WH - ANES): 14.0000 mL/h | INTRAMUSCULAR | Status: DC | PRN

## 2014-09-13 MED ORDER — ONDANSETRON HCL 4 MG/2ML IJ SOLN: 4.0000 mg | Freq: Four times a day (QID) | INTRAMUSCULAR | Status: DC | PRN

## 2014-09-13 MED ORDER — DIPHENHYDRAMINE HCL 25 MG PO CAPS: 25.0000 mg | ORAL_CAPSULE | Freq: Four times a day (QID) | ORAL | Status: DC | PRN

## 2014-09-13 MED ORDER — LANOLIN HYDROUS EX OINT: TOPICAL_OINTMENT | CUTANEOUS | Status: DC | PRN

## 2014-09-13 MED ORDER — SODIUM CHLORIDE 0.9 % IJ SOLN: 3.0000 mL | Freq: Two times a day (BID) | INTRAMUSCULAR | Status: DC

## 2014-09-13 MED ORDER — LIDOCAINE HCL (PF) 1 % IJ SOLN
30.0000 mL | INTRAMUSCULAR | Status: DC | PRN
  Filled 2014-09-13: qty 30

## 2014-09-13 MED ORDER — PRENATAL MULTIVITAMIN CH
1.0000 | ORAL_TABLET | Freq: Every day | ORAL | Status: DC
  Administered 2014-09-14: 1 via ORAL
  Filled 2014-09-13: qty 1

## 2014-09-13 MED ORDER — OXYTOCIN 40 UNITS IN LACTATED RINGERS INFUSION - SIMPLE MED: 62.5000 mL/h | INTRAVENOUS | Status: DC

## 2014-09-13 MED ORDER — FENTANYL 2.5 MCG/ML BUPIVACAINE 1/10 % EPIDURAL INFUSION (WH - ANES)
INTRAMUSCULAR | Status: AC
  Filled 2014-09-13: qty 125

## 2014-09-13 MED ORDER — OXYCODONE-ACETAMINOPHEN 5-325 MG PO TABS
1.0000 | ORAL_TABLET | ORAL | Status: DC | PRN
  Administered 2014-09-13: 1 via ORAL
  Filled 2014-09-13: qty 1

## 2014-09-13 MED ORDER — SODIUM CHLORIDE 0.9 % IJ SOLN: 3.0000 mL | INTRAMUSCULAR | Status: DC | PRN

## 2014-09-13 MED ORDER — BENZOCAINE-MENTHOL 20-0.5 % EX AERO
1.0000 | INHALATION_SPRAY | CUTANEOUS | Status: DC | PRN
Start: 2014-09-13 — End: 2014-09-15

## 2014-09-13 MED ORDER — DIPHENHYDRAMINE HCL 50 MG/ML IJ SOLN: 12.5000 mg | INTRAMUSCULAR | Status: DC | PRN

## 2014-09-13 MED ORDER — ONDANSETRON HCL 4 MG/2ML IJ SOLN: 4.0000 mg | INTRAMUSCULAR | Status: DC | PRN

## 2014-09-13 MED ORDER — OXYTOCIN BOLUS FROM INFUSION: 500.0000 mL | INTRAVENOUS | Status: DC

## 2014-09-13 MED ORDER — ONDANSETRON HCL 4 MG PO TABS
4.0000 mg | ORAL_TABLET | ORAL | Status: DC | PRN
Start: 2014-09-13 — End: 2014-09-15
  Administered 2014-09-13: 4 mg via ORAL

## 2014-09-13 MED ORDER — TERBUTALINE SULFATE 1 MG/ML IJ SOLN
0.2500 mg | Freq: Once | INTRAMUSCULAR | Status: DC | PRN
  Filled 2014-09-13: qty 1

## 2014-09-13 MED ORDER — OXYTOCIN 40 UNITS IN LACTATED RINGERS INFUSION - SIMPLE MED: 62.5000 mL/h | INTRAVENOUS | Status: DC | PRN

## 2014-09-13 MED ORDER — DIBUCAINE 1 % RE OINT: 1.0000 "application " | TOPICAL_OINTMENT | RECTAL | Status: DC | PRN

## 2014-09-13 MED ORDER — ACETAMINOPHEN 325 MG PO TABS: 650.0000 mg | ORAL_TABLET | ORAL | Status: DC | PRN

## 2014-09-13 MED ORDER — PHENYLEPHRINE 40 MCG/ML (10ML) SYRINGE FOR IV PUSH (FOR BLOOD PRESSURE SUPPORT)
80.0000 ug | PREFILLED_SYRINGE | INTRAVENOUS | Status: DC | PRN
  Filled 2014-09-13: qty 2

## 2014-09-13 MED ORDER — LACTATED RINGERS IV SOLN: INTRAVENOUS | Status: DC

## 2014-09-13 MED ORDER — FLEET ENEMA 7-19 GM/118ML RE ENEM: 1.0000 | ENEMA | RECTAL | Status: DC | PRN

## 2014-09-13 MED ORDER — LACTATED RINGERS IV SOLN
500.0000 mL | INTRAVENOUS | Status: DC | PRN
  Administered 2014-09-13: 500 mL via INTRAVENOUS

## 2014-09-13 MED ORDER — LACTATED RINGERS IV SOLN
INTRAVENOUS | Status: DC
  Administered 2014-09-13: 06:00:00 via INTRAVENOUS

## 2014-09-13 MED ORDER — CITRIC ACID-SODIUM CITRATE 334-500 MG/5ML PO SOLN: 30.0000 mL | ORAL | Status: DC | PRN

## 2014-09-13 MED ORDER — ZOLPIDEM TARTRATE 5 MG PO TABS: 5.0000 mg | ORAL_TABLET | Freq: Every evening | ORAL | Status: DC | PRN

## 2014-09-13 MED ORDER — SENNOSIDES-DOCUSATE SODIUM 8.6-50 MG PO TABS
2.0000 | ORAL_TABLET | ORAL | Status: DC
  Administered 2014-09-14 (×2): 2 via ORAL
  Filled 2014-09-13 (×2): qty 2

## 2014-09-13 MED ORDER — WITCH HAZEL-GLYCERIN EX PADS: 1.0000 "application " | MEDICATED_PAD | CUTANEOUS | Status: DC | PRN

## 2014-09-13 MED ORDER — FLEET ENEMA 7-19 GM/118ML RE ENEM: 1.0000 | ENEMA | Freq: Every day | RECTAL | Status: DC | PRN

## 2014-09-13 MED ORDER — SIMETHICONE 80 MG PO CHEW
80.0000 mg | CHEWABLE_TABLET | ORAL | Status: DC | PRN
  Administered 2014-09-14: 80 mg via ORAL
  Filled 2014-09-13: qty 1

## 2014-09-13 MED ORDER — BISACODYL 10 MG RE SUPP: 10.0000 mg | Freq: Every day | RECTAL | Status: DC | PRN

## 2014-09-13 MED ORDER — OXYCODONE-ACETAMINOPHEN 5-325 MG PO TABS: 2.0000 | ORAL_TABLET | ORAL | Status: DC | PRN

## 2014-09-13 MED ORDER — SODIUM CHLORIDE 0.9 % IV SOLN: 250.0000 mL | INTRAVENOUS | Status: DC | PRN

## 2014-09-13 MED ORDER — PHENYLEPHRINE 40 MCG/ML (10ML) SYRINGE FOR IV PUSH (FOR BLOOD PRESSURE SUPPORT)
PREFILLED_SYRINGE | INTRAVENOUS | Status: AC
  Filled 2014-09-13: qty 20

## 2014-09-13 NOTE — Plan of Care (Signed)
Problem: Consults Goal: Orientation to unit: Other (Specify with a note) Outcome: Completed/Met Date Met:  09/13/14 Spanish interpreter services

## 2014-09-13 NOTE — Progress Notes (Signed)
Pt platelet 94 Fredirick LatheKristy Acosta MD notified. RN verified that Pt can take Motrin.

## 2014-09-13 NOTE — Anesthesia Procedure Notes (Signed)
Epidural Patient location during procedure: OB  Preanesthetic Checklist Completed: patient identified, site marked, surgical consent, pre-op evaluation, timeout performed, IV checked, risks and benefits discussed and monitors and equipment checked  Epidural Patient position: sitting Prep: site prepped and draped and DuraPrep Patient monitoring: continuous pulse ox and blood pressure Approach: midline Location: L3-L4 Injection technique: LOR air  Needle:  Needle type: Tuohy  Needle gauge: 17 G Needle length: 9 cm and 9 Needle insertion depth: 4 cm Catheter type: closed end flexible Catheter size: 19 Gauge Catheter at skin depth: 10 cm Test dose: negative  Assessment Events: blood not aspirated, injection not painful, no injection resistance, negative IV test and no paresthesia  Additional Notes Dosing of Epidural:  1st dose, through catheter ............................................. Xylocaine 30 mg  2nd dose, through catheter, after waiting 3 minutes........Xylocaine 50 mg   ( 1% Xylo charted as a single dose in Epic Meds for ease of charting; actual dosing was fractionated as above, for saftey's sake)  As each dose occurred, patient was free of IV sx; and patient exhibited no evidence of SA injection.  Patient is more comfortable after epidural dosed. Please see RN's note for documentation of vital signs,and FHR which are stable.  Patient reminded not to try to ambulate with numb legs, and that an RN must be present the 1st time she attempts to get up.    

## 2014-09-13 NOTE — MAU Note (Signed)
PT  SAYS [facility]  HURT  WORSE  SINCE -  WAS HERE   YESTERDAY.  VE  1-2  CM.   DENIES HSV AND MRSA.   GBS- NEG.

## 2014-09-13 NOTE — MAU Note (Signed)
PT HAS RETURNED     WITH  [facility]  STRONGER .

## 2014-09-13 NOTE — H&P (Deleted)
error 

## 2014-09-13 NOTE — Progress Notes (Signed)
Hannah DionesMaria L Orozco is a 34 y.o. G2P1001 at 3458w2d admitted for active labor  Subjective:   Objective: BP 96/66 mmHg  Pulse 94  Temp(Src) 98.1 F (36.7 C) (Oral)  Resp 18  Ht 4\' 11"  (1.499 m)  Wt 53.071 kg (117 lb)  BMI 23.62 kg/m2  SpO2 99%  LMP 12/12/2013 (Approximate)      FHT:  FHR: 140 bpm, variability: moderate,  accelerations:  Present,  decelerations:  Absent UC:   regular, every 2 minutes SVE:   Dilation: 5 Effacement (%): 90 Station: -2 Exam by:: Dr. Loreta AveAcosta  Labs: Lab Results  Component Value Date   WBC 10.0 09/13/2014   HGB 12.5 09/13/2014   HCT 37.2 09/13/2014   MCV 93.5 09/13/2014   PLT 111* 09/13/2014    Assessment / Plan: Augmentation of labor, progressing well  Labor: Progressing on Pitocin, adequate contractions per IUPC, will recheck in 1hr Preeclampsia:  no signs or symptoms of toxicity Fetal Wellbeing:  Category I Pain Control:  Epidural I/D:  n/a Anticipated MOD:  NSVD  Hannah Orozco 09/13/2014, 12:31 PM

## 2014-09-13 NOTE — H&P (Signed)
Hannah Orozco is a 34 y.o. female presenting for . Maternal Medical History:  Reason for admission: Contractions.   Contractions: Started yesterday, was sent home from MAU twice in early labor.  Presented back this am in early active phase   Fetal activity: Perceived fetal activity is normal.    Prenatal complications: no prenatal complications   OB History    Gravida Para Term Preterm AB TAB SAB Ectopic Multiple Living   2 1 1       1      Past Medical History  Diagnosis Date  . Anxiety     no meds, doing ok  . Anemia   . Thrombocytopenia   . GERD (gastroesophageal reflux disease)    Past Surgical History  Procedure Laterality Date  . Esophagus surgery  ~2004    laparoscopic, ? opened up esophagus so she could eat/swallow   Family History: family history includes Hypertension in her mother. There is no history of Hearing loss. Social History:  reports that she has never smoked. She has never used smokeless tobacco. She reports that she does not drink alcohol or use illicit drugs.   Prenatal Transfer Tool  Maternal Diabetes: No Genetic Screening: Normal Maternal Ultrasounds/Referrals: Normal Fetal Ultrasounds or other Referrals:  None Maternal Substance Abuse:  No Significant Maternal Medications:  None Significant Maternal Lab Results:  None Other Comments:  None  Review of Systems  Constitutional: Negative.   HENT: Negative.   Eyes: Negative.   Respiratory: Negative.   Cardiovascular: Negative.   Gastrointestinal: Positive for abdominal pain.  Genitourinary: Negative.   Musculoskeletal: Negative.   Psychiatric/Behavioral: Negative.     Dilation: 4 Effacement (%): 90 Station: -2 Exam by:: LYNETTE, RN Blood pressure 113/72, pulse 83, temperature 99.4 F (37.4 C), temperature source Oral, resp. rate 20, height 4\' 11"  (1.499 m), weight 53.071 kg (117 lb), last menstrual period 12/12/2013, currently breastfeeding. Maternal Exam:  Uterine Assessment:  Contraction strength is moderate.  Contraction duration is 3 minutes. Contraction frequency is regular.   Abdomen: Fetal presentation: vertex     Physical Exam  Constitutional: She is oriented to person, place, and time. She appears well-developed and well-nourished.  HENT:  Head: Normocephalic.  Cardiovascular: Normal rate and regular rhythm.   Respiratory: Effort normal.  GI: Soft. There is no tenderness.  Genitourinary: Vagina normal.  Musculoskeletal: Normal range of motion.  Neurological: She is alert and oriented to person, place, and time.  Skin: Skin is warm and dry.  Psychiatric: She has a normal mood and affect.    Prenatal labs: ABO, Rh: O/POS/-- (12/28 1051) Antibody: NEG (12/28 1051) Rubella: 5.64 (12/28 1051) RPR: NON REAC (04/20 1458)  HBsAg: NEGATIVE (12/28 1051)  HIV: NONREACTIVE (04/20 1458)  GBS: NOT DETECTED (06/08 1023)   Assessment/Plan: Early labor.  Dr Richarda BladeAdamo not available today, and Dr. Pollie MeyerMcIntyre graduated yesterday, so FP will manage labor.  Plans epidural ASAP Expectant management   CRESENZO-DISHMAN,Richerd Grime 09/13/2014, 7:53 AM

## 2014-09-13 NOTE — Anesthesia Preprocedure Evaluation (Signed)

## 2014-09-14 LAB — RPR: RPR Ser Ql: NONREACTIVE

## 2014-09-14 MED ORDER — FAMOTIDINE 20 MG PO TABS
20.0000 mg | ORAL_TABLET | Freq: Two times a day (BID) | ORAL | Status: DC | PRN
  Administered 2014-09-14 (×2): 20 mg via ORAL
  Filled 2014-09-14 (×2): qty 1

## 2014-09-14 NOTE — Progress Notes (Signed)
Post Partum Day 1 Subjective: no complaints, up ad lib, voiding and tolerating PO  Objective: Blood pressure 90/54, pulse 66, temperature 98 F (36.7 C), temperature source Axillary, resp. rate 18, height 4\' 11"  (1.499 m), weight 53.071 kg (117 lb), last menstrual period 12/12/2013, SpO2 99 %, unknown if currently breastfeeding.  Physical Exam:  General: alert, cooperative and no distress Lochia: appropriate Uterine Fundus: firm Incision: n/a DVT Evaluation: No evidence of DVT seen on physical exam. No cords or calf tenderness. Trace LE edema is present.   Recent Labs  09/13/14 0615 09/13/14 1755  HGB 12.5 11.7*  HCT 37.2 35.5*  Platelets      111                   94  Assessment/Plan: Plan for discharge tomorrow, Breastfeeding and Contraception nexplanon.  Peripartum thrombocytopenia, asymptomatic with normal BP. This occurred with her previous pregnancy as well and resolved spontaneously. Continue to monitor.   LOS: 1 day   [facility] LLCElena Adamo 09/14/2014, 8:04 AM

## 2014-09-14 NOTE — Anesthesia Postprocedure Evaluation (Signed)
  Anesthesia Post-op Note  Patient: Waldemar DickensMaria L Conti  Procedure(s) Performed: * No procedures listed *  Patient Location: Mother/Baby  Anesthesia Type:Epidural  Level of Consciousness: awake and alert   Airway and Oxygen Therapy: Patient Spontanous Breathing  Post-op Pain: mild  Post-op Assessment: Post-op Vital signs reviewed, Patient's Cardiovascular Status Stable, Respiratory Function Stable, No signs of Nausea or vomiting, Pain level controlled, No headache, Spinal receding and Patient able to bend at knees              Post-op Vital Signs: Reviewed  Last Vitals:  Filed Vitals:   09/14/14 0808  BP: 96/60  Pulse: 60  Temp: 36.4 C  Resp: 18    Complications: No apparent anesthesia complications

## 2014-09-14 NOTE — Progress Notes (Signed)
Checked on patient.  Ordered patient breakfast Research officer, trade unionpanish Interpreter

## 2014-09-14 NOTE — Progress Notes (Signed)
Checked on patient.  Ordered patient dinner and late snack. Spanish Interpreter

## 2014-09-14 NOTE — Progress Notes (Signed)
Checked on patient.  Patient was sleeping.  Spanish Interpreter

## 2014-09-14 NOTE — Progress Notes (Signed)
Checked on patient.  Assisted patient with questions concerning medication.  Spanish Interpreter

## 2014-09-14 NOTE — Lactation Note (Signed)
This note was copied from the chart of Hannah Laney PotashMaria Borton. Lactation Consultation Note  Mom is choosing to breast and formula feed. Presented to mom that the more BM the baby receives the better it is.  She believes she does not have enough milk for the baby.  Reviewed supply and demand, hand expression and use of manual pump.  Mom was able to hand express about 1 ml of colostrum. Explained milk storage.  Explained that mom could spoon feed it back when the baby woke or that she could use her finger to put it into the baby's mouth.  Asked mom to put her call bell on when the baby was attached so that a LATCH score could be done. She agreed and this was communicated to her RN.  Has information on support group and outpatient services.  Patient Name: Hannah Orozco ZOXWR'UToday's Date: 09/14/2014 Reason for consult: Initial assessment   Maternal Data Has patient been taught Hand Expression?: Yes Does the patient have breastfeeding experience prior to this delivery?: Yes  Feeding    LATCH Score/Interventions                      Lactation Tools Discussed/Used     Consult Status      Hannah Orozco, Hannah Orozco 09/14/2014, 11:59 AM

## 2014-09-15 DIAGNOSIS — Z3A39 39 weeks gestation of pregnancy: Secondary | ICD-10-CM

## 2014-09-15 LAB — CBC
HCT: 27.9 % — ABNORMAL LOW (ref 36.0–46.0)
HEMOGLOBIN: 9.1 g/dL — AB (ref 12.0–15.0)
MCH: 31.1 pg (ref 26.0–34.0)
MCHC: 32.6 g/dL (ref 30.0–36.0)
MCV: 95.2 fL (ref 78.0–100.0)
PLATELETS: 94 10*3/uL — AB (ref 150–400)
RBC: 2.93 MIL/uL — AB (ref 3.87–5.11)
RDW: 14.7 % (ref 11.5–15.5)
WBC: 8 10*3/uL (ref 4.0–10.5)

## 2014-09-15 MED ORDER — SENNOSIDES-DOCUSATE SODIUM 8.6-50 MG PO TABS: 1.0000 | ORAL_TABLET | Freq: Every evening | ORAL | Status: DC | PRN

## 2014-09-15 MED ORDER — POLYETHYLENE GLYCOL 3350 17 G PO PACK
17.0000 g | PACK | Freq: Once | ORAL | Status: DC
  Filled 2014-09-15: qty 1

## 2014-09-15 MED ORDER — IBUPROFEN 600 MG PO TABS: 600.0000 mg | ORAL_TABLET | Freq: Four times a day (QID) | ORAL | Status: DC

## 2014-09-15 MED ORDER — POLYETHYLENE GLYCOL 3350 17 G PO PACK: 17.0000 g | PACK | Freq: Once | ORAL | Status: DC

## 2014-09-15 NOTE — Lactation Note (Signed)
This note was copied from the chart of Hannah Laney PotashMaria Muriel. Lactation Consultation Note  QUestioned weight loss of almost 9 % in 24 hours. Baby was re-weighed and she was at a 6 % weight loss.  The doctor who speaks Spanish and I discussed findings with parents. Latch appears to be good except that mom is experiencing pain of a 5 on the pain scale.  Suck assessment reveals a high palate, thick posterior frenum and tightness in the posterior portion of the mouth.  Snapback was also heard.  As I advanced my finger there was less snapback.  Baby was re-latched after this but mom felt no change.  A #24 NS was initiated. Baby attached easily, had rhythmic suckles and audible swallows.  Mom reported pain of 1-2 on the pain scale. Explained need to see milk in the shield after BF.  Doctor explained to her the need for frequent weight checks. Encouraged mom to post pump for 10 minutes 6 times in 24 hours to help bring milk to volume.  Formula prep instructions given to mom. Patient Name: Hannah Orozco NFAOZ'HToday's Date: 09/15/2014 Reason for consult: Follow-up assessment   Maternal Data    Feeding Feeding Type: Breast Fed Length of feed: 5 min  LATCH Score/Interventions Latch: Grasps breast easily, tongue down, lips flanged, rhythmical sucking.  Audible Swallowing: Spontaneous and intermittent  Type of Nipple: Everted at rest and after stimulation  Comfort (Breast/Nipple): Filling, red/small blisters or bruises, mild/mod discomfort     Hold (Positioning): Assistance needed to correctly position infant at breast and maintain latch.  LATCH Score: 8  Lactation Tools Discussed/Used     Consult Status      Hannah Orozco, Hannah Orozco 09/15/2014, 9:16 AM

## 2014-09-15 NOTE — Discharge Instructions (Signed)

## 2014-09-15 NOTE — Discharge Summary (Signed)
Obstetric Discharge Summary Reason for Admission: onset of labor Prenatal Procedures: NST Intrapartum Procedures: spontaneous vaginal delivery Postpartum Procedures: none Complications-Operative and Postpartum: none HEMOGLOBIN  Date Value Ref Range Status  09/15/2014 9.1* 12.0 - 15.0 g/dL Final    Comment:    REPEATED TO VERIFY DELTA CHECK NOTED    HCT  Date Value Ref Range Status  09/15/2014 27.9* 36.0 - 46.0 % Final    Physical Exam:  General: alert, cooperative and no distress Lochia: appropriate Uterine Fundus: firm Incision: n/a DVT Evaluation: No evidence of DVT seen on physical exam. No cords or calf tenderness. No significant calf/ankle edema.  Discharge Diagnoses: Term Pregnancy-delivered  Discharge Information: Date: 09/15/2014 Activity: pelvic rest Diet: routine Medications: Ibuprofen Condition: stable Instructions: refer to practice specific booklet Discharge to: home Follow-up Information    Follow up with Beverely LowElena Manha Amato, MD. Schedule an appointment as soon as possible for a visit in 1 month.   Specialty:  Family Medicine   Why:  Thalia PartyHay que hacer cita en 1 mes para un chequeo y applicar para el nexplanon/planificacion   Contact information:   [address] [place] [id number] [phone]       Newborn Data: Live born female  Birth Weight: 6 lb 11.4 oz (3045 g) APGAR: 9, 9  Home with mother.  Hannah Orozco 09/15/2014, 9:20 AM

## 2014-09-16 NOTE — Progress Notes (Signed)
Post discharge chart review completed.  

## 2014-09-18 ENCOUNTER — Encounter: Payer: Self-pay | Admitting: Family Medicine

## 2014-09-23 ENCOUNTER — Ambulatory Visit (HOSPITAL_COMMUNITY): Admission: RE | Admit: 2014-09-23 | Payer: Self-pay | Source: Ambulatory Visit

## 2014-10-09 ENCOUNTER — Ambulatory Visit (INDEPENDENT_AMBULATORY_CARE_PROVIDER_SITE_OTHER): Payer: Medicaid Other | Admitting: Family Medicine

## 2014-10-09 DIAGNOSIS — Z30013 Encounter for initial prescription of injectable contraceptive: Secondary | ICD-10-CM

## 2014-10-09 MED ORDER — MEDROXYPROGESTERONE ACETATE 150 MG/ML IM SUSP
150.0000 mg | Freq: Once | INTRAMUSCULAR | Status: AC
  Administered 2014-10-09: 150 mg via INTRAMUSCULAR

## 2014-10-09 NOTE — Progress Notes (Signed)
   Subjective:   Hannah Orozco is a 34 y.o. female with a history of SVD x2 here for PP visit.   - She is 3 weeks postpartum following a spontaneous vaginal delivery.  - I have fully reviewed the prenatal and intrapartum course.  - The delivery was at [redacted]w[redacted]d gestational weeks.  - Outcome: spontaneous vaginal delivery.  - Postpartum course has been normal.  - Baby's course has been normal.  - Baby is feeding by both breast and bottle - Bleeding thin lochia.  - Bowel function is normal. Bladder function is normal.  - Patient is not sexually active.  - Contraception method is nothing currently - had voiced interest in Mirena during pregnancy, but now states taht she does not want IUD but nexplanon. I explained that there is no patient assistance program for nexplanon, and she is still adamant that she does not want IUD. She would like Depo shots instead. - Postpartum depression screening: negative.  Breast pain: - Sunday noticed chills and myalgias - no fever - today breasts hurt - Took ibuoprofen  and it helped - Breastfeeding everyhour - Hurts worse with breastfeeding - no redness or breast discharge - no dysuria  Review of Systems:  Per HPI. All other systems reviewed and are negative.   PMH, PSH, Medications, Allergies, and FmHx reviewed and updated in EMR.  Social History: non smoker  Objective:  BP 99/63 mmHg  Pulse 81  Temp(Src) 98.1 F (36.7 C) (Oral)  Ht  (1.499 m)  Wt 100 lb 6.4 oz (45.541 kg)  BMI 20.27 kg/m2  Gen:  34 y.o. female in NAD HEENT: NCAT, MMM, EOMI, PERRL, anicteric sclerae CV: RRR, no MRG Resp: Non-labored, CTAB, no wheezes noted Abd: Soft, NTND, BS present, no guarding or organomegaly Breast: Non-engorged. Mildly tender to palpation diffusely in both breasts. No redness or discharge noted Ext: WWP, no edema Neuro: Alert and oriented, speech normal    Assessment:     Hannah Orozco is a 34 y.o. female here for postpartum  visit    Plan:     See problem list for problem-specific plans.   Erasmo Downer, MD PGY-2,  [facility] 10/09/2014  11:46 AM

## 2014-10-09 NOTE — Patient Instructions (Signed)
Nice to meet you.  We will give you Depo shot for birth control today.  Please consider an IUD for long-term birth control.  You will need another shot in 3 months if you do not want an IUD.  Take care,  Dr. Tera Mater y mastitis (Breastfeeding and Mastitis) La mastitis es una inflamacin en el tejido [place]. Puede aparecer en las mujeres que estn amamantando. Esto puede hacer que el amamantamiento sea doloroso. En muchos casos la mastitis desaparecer por s misma. El mdico determinar si necesita tratamiento. CAUSAS Muchas veces se asocia a una obstruccin de los conductos por los que pasa la [place] (lactiferos). Esto ocurre cuando se acumula mucha [employer]. Las causas del exceso de [employer] en la mama pueden ser:  El beb no se prende bien al [employer]. Si el beb no se prende adecuadamente, es probable que no vace la mama completamente al alimentarse.  Dejar que pase mucho tiempo entre cada alimentacin.  Usar un sostn u otras prendas que sean muy [place]. Esto hara mucha presin en los conductos lactferos por lo tanto la [place] no fluye a travs de ellos como debera. La mastitis tambin puede ser [place] en una infeccin bacteriana. Las bacterias que causan la infeccin pueden entrar a travs de cortes o grietas en la piel al tejido [place]. En las mujeres que [place], esto puede ocurrir debido a Programmer, applications o irritacin de la piel. Las [employer] en la piel pueden producirse cuando el beb no se prende adecuadamente al pecho. SIGNOS Y SNTOMAS  Hinchazn, enrojecimiento, sensibilidad y dolor en la zona de la mama.  Hinchazn de los ganglios que se encuentran debajo del brazo, en el mismo lado.  La fiebre puede o no acompaar a la mastitis. Si se permite que la infeccin progrese, podr formarse una acumulacin de pus (absceso). DIAGNSTICO  El mdico podr habitualmente diagnosticar mastitis en base a sus sntomas y al examen fsico. Le indicarn anlisis para confirmar  el diagnstico. Estos pueden ser:  Extraccin del pus de la mama, aplicando presin en la zona. El pus se examinar en el laboratorio para determinar de qu bacteria se trata. Si hay un absceso, debern retirarle el lquido con [place]. Con el lquido se confirmar el diagnstico y se Production assistant, radio bacteria que causa el problema. En la [employer] no se observa pus.  Le indicarn anlisis de sangre para determinar si su organismo est luchando contra una infeccin bacteriana.  Una mamografa o una ecografa podrn descartar otros problemas o enfermedades. TRATAMIENTO  En muchos casos la mastitis que se produce por el amamantamiento desaparecer por s misma. El mdico podr indicarle que espere 24 horas despus de verla por primera vez para decidir si necesita recetarle un medicamento. Si los sntomas empeoran despus de 24 horas, el Nurse, adult un antibitico para tratar la mastitis. El Nurse, children's qu bacteria es ms probable que cause la infeccin y Art gallery manager el tipo de antibitico ms adecuado. Podr cambiarlo segn el resultado del cultivo o si la respuesta al antibitico no es la [place]. Los antibiticos se administran por va oral. Le recetar medicamentos para Chief Technology Officer. INSTRUCCIONES PARA EL CUIDADO EN EL HOGAR  Slo tome medicamentos de venta libre o recetados para Primary school teacher, Environmental health practitioner o bajar la fiebre, segn las indicaciones de su mdico.  Si su mdico le receta antibiticos, tmelos tal [employer] indic. Asegrese de que finaliza la prescripcin completa aunque se sienta mejor.  No use un  sostn demasiado ajustado o con aro. Use un sostn suave, de soporte.  Aumente la ingestin de lquidos, especialmente si tiene fiebre.  Contine hasta vaciar la mama. El Ecologist informar si su leche es segura para el beb o debe descartarla. Podrn indicarle que deje de [employer] mdico considere que es seguro para su beb. Use un  sacaleche si le aconsejan dejar de amamantar.  Mantenga los pezones secos y limpios.  Vace la primera mama completamente antes de amamantar con la segunda. Si el beb no vaca la mama por algn motivo, utilice un sacaleche.  Si debe regresar a Engineer, water, use un sacaleche en el horario de trabajo para [employer] horarios.  Evite que las [address] se llenen mucho de [place] (congestin). SOLICITE ATENCIN MDICA SI:  [place] secrecin similar a pus por la mama.  Los sntomas no mejoran con el tratamiento indicado por su mdico dentro de los [address]  [address]. SOLICITE ATENCIN MDICA DE INMEDIATO SI:  El dolor y la hinchazn empeoran.  Aumenta el dolor y no puede controlarlo con Tourist information centre manager.  Observa una lnea roja que se extiende desde la mama hasta la axila.  Tiene fiebre o sntomas persistentes durante ms de 2 - 3 das.  Tiene fiebre y los sntomas empeoran repentinamente. ASEGRESE DE QUE:   Comprende estas instrucciones.  Controlar su afeccin.  Recibir ayuda de inmediato si no mejora o si empeora. Document Released: 08/18/2007 Document Revised: 03/06/2013 [facility] Patient Information 2015 [place], [place]. This information is not intended to replace advice given to you by your health care provider. Make sure you discuss any questions you have with your health care provider.

## 2014-10-09 NOTE — Assessment & Plan Note (Signed)
Uncomplicated postpartum course Discussed LARCs with patient today, but she is adamant she does not want IUD Depo shot given today Screening negative for postpartum depression No evidence of mastitis currently - encouraged patient to continue breast-feeding regularly and calls her come back to see [place] if pain worsens, she has fevers, she notices any redness or nipple discharge.

## 2014-11-25 ENCOUNTER — Ambulatory Visit: Payer: Self-pay

## 2014-12-25 ENCOUNTER — Ambulatory Visit (INDEPENDENT_AMBULATORY_CARE_PROVIDER_SITE_OTHER): Payer: Self-pay | Admitting: *Deleted

## 2014-12-25 DIAGNOSIS — Z3042 Encounter for surveillance of injectable contraceptive: Secondary | ICD-10-CM

## 2014-12-25 MED ORDER — MEDROXYPROGESTERONE ACETATE 150 MG/ML IM SUSP
150.0000 mg | Freq: Once | INTRAMUSCULAR | Status: AC
  Administered 2014-12-25: 150 mg via INTRAMUSCULAR

## 2014-12-25 NOTE — Progress Notes (Signed)
   Pt in for Depo Provera injection.  Pt tolerated Depo injection. Depo given right upper outer quadrant.  Next injection due Dec. 28, 2016-Jan. 11, 2017.  Reminder card given. Clovis PuMartin, Claudia Greenley L, RN

## 2015-02-27 ENCOUNTER — Ambulatory Visit (INDEPENDENT_AMBULATORY_CARE_PROVIDER_SITE_OTHER): Payer: Self-pay | Admitting: Obstetrics and Gynecology

## 2015-02-27 VITALS — BP 100/56 | HR 78 | Temp 97.8°F | Ht 59.0 in | Wt 95.0 lb

## 2015-02-27 DIAGNOSIS — M545 Low back pain, unspecified: Secondary | ICD-10-CM

## 2015-02-27 LAB — POCT URINALYSIS DIPSTICK
BILIRUBIN UA: NEGATIVE
Blood, UA: NEGATIVE
Glucose, UA: NEGATIVE
KETONES UA: NEGATIVE
LEUKOCYTES UA: NEGATIVE
NITRITE UA: NEGATIVE
Protein, UA: NEGATIVE
Spec Grav, UA: 1.025
Urobilinogen, UA: 0.2
pH, UA: 6

## 2015-02-27 LAB — POCT UA - MICROSCOPIC ONLY

## 2015-02-27 MED ORDER — CAMPHOR-MENTHOL-METHYL SAL 4-10-30 % EX CREA: TOPICAL_CREAM | CUTANEOUS | Status: DC

## 2015-02-27 MED ORDER — ACETAMINOPHEN 500 MG PO TABS: 500.0000 mg | ORAL_TABLET | Freq: Four times a day (QID) | ORAL | Status: DC | PRN

## 2015-02-27 NOTE — Patient Instructions (Addendum)
-Use Tylenol for pain it is safe in breastfeeding. Also prescribed a cream to use to area to relax muscles. -Use heating pad as well for pain -Please return if not better in 4-6 weeks  -Follow-up with your PCP at [facility]. Ambrose FinlandValerie A Keck, NP   Distensin lumbosacra (Lumbosacral Strain) La distensin lumbosacra es una distensin de cualquiera de las partes que componen las vrtebras lumbosacras. Las vrtebras lumbosacras son los huesos que conforman el tercio inferior de la columna vertebral. Estas vrtebras estn sostenidas por msculos y un resistente tejido fibroso (ligamentos).  CAUSAS  Un golpe repentino en la espalda puede provocar una distensin lumbosacra. Adems, cualquier tipo de movimiento que cause una elongacin excesiva de los msculos de la zona lumbar puede provocar este tipo de distensin. Esto se ve normalmente en las personas que se esfuerzan demasiado, se caen, levantan objetos pesados, se agachan o estn en cuclillas con regularidad. FACTORES DE RIESGO  Trabajo agotador.  Participar en deportes en los cuales se deba empujar o tirar y que requieren de un giro repentino de la espalda (tenis, golf, bisbol).  Levantar peso.  Curvatura excesiva de la zona lumbar.  Pelvis hacia adelante.  Espalda o msculos abdominales dbiles, o ambos.  Tendones isquiotibiales tensos. SIGNOS Y SNTOMAS  La distensin lumbosacra puede provocar dolor en la zona de la lesin o un dolor que baja (se extiende) hasta la pierna.  DIAGNSTICO Con frecuencia, el mdico puede diagnosticar una distensin BellSouthlumbosacra mediante un examen fsico. En algunos casos, es posible que deba realizarse pruebas, como una Miamiradiografa.  TRATAMIENTO  El tratamiento para la lesin lumbar depende de muchos factores que el mdico Paediatric nursedeber evaluar. Sin embargo, la Harley-Davidsonmayora de los tratamientos incluye el uso de antiinflamatorios. INSTRUCCIONES PARA EL CUIDADO EN EL HOGAR   Evite actividades fsicas  difciles (tenis, raquetbol, esqu acutico) si no tiene un buen estado fsico para practicarlas. Esto puede Radio produceragravar o provocar problemas.  Si tiene un problema en la espalda, evite los deportes que requieren de movimientos corporales bruscos. La natacin y las caminatas son las actividades ms seguras.  Mantenga una buena postura.  Mantenga un peso saludable.  En el caso de episodios agudos, puede colocar hielo en la zona lesionada.  Ponga el hielo en una bolsa plstica.  Coloque una toalla entre la piel y la bolsa de hielo.  Deje el hielo durante 20 minutos, 2 a 3 veces por da.  Cuando la zona lumbar comience a sanar, es posible que le recomienden ejercicios de elongacin y fortalecimiento. SOLICITE ATENCIN MDICA SI:  El dolor de Oncologistespalda empeora.  Tiene un dolor de espalda intenso que no mejora con medicamentos. SOLICITE ATENCIN MDICA DE INMEDIATO SI:   Siente entumecimiento, hormigueo, debilidad o problemas con el uso de los brazos o las piernas.  Nota cambios en el control de la vejiga o el intestino.  Siente un aumento del Cytogeneticistdolor en cualquier parte del cuerpo, incluido el vientre (abdomen).  Nota que le falta el aire, se siente mareado o se desmaya.  Tiene Programme researcher, broadcasting/film/videomalestar estomacal (nuseas), vomita o comienza a sudar.  Nota un cambio de color en los dedos del pie o las piernas, o los pies se ponen muy fros. ASEGRESE DE QUE:   Comprende estas instrucciones.  Controlar su afeccin.  Recibir ayuda de inmediato si no mejora o si empeora.   Esta informacin no tiene Theme park managercomo fin reemplazar el consejo del mdico. Asegrese de hacerle al mdico cualquier pregunta que tenga.   Document Released: 12/09/2004  Document Revised: 03/06/2013 Elsevier Interactive Patient Education [employer].

## 2015-02-27 NOTE — Progress Notes (Signed)
Subjective:   Patient ID: Hannah Orozco, female    DOB: 08/27/1980, 34 y.o.   MRN: [id number]  Patient presents for Same Day Appointment. Video interpreter used Lya [id number].  Chief Complaint  Patient presents with  . Back Pain    x 4 days    HPI: # BACK PAIN -painful when working, sitting, sleeping - has back pain all the time -Across lower back -Started on Monday at noon; just lifted 34yo child to eat into chair -is breastfeeding and also has 575 month old -Pain is described as sharp and stabbing; "like menstrual cramps" -receives Depo for birth control -Patient has tried nothing; because she is breastfeeding -Pain radiates across back. But does now go down legs or up back History of trauma or injury: no Prior history of similar pain: no  Symptoms Incontinence of bowel or bladder:  no Numbness of leg: sometimes Fever: no Rash:no Dysuria: no Vaginal discharge: no  ROS see HPI Smoking Status noted.  Past medical history, surgical, family, and social history reviewed and updated in the EMR as appropriate.  Objective:  BP 100/56 mmHg  Pulse 78  Temp(Src) 97.8 F (36.6 C) (Oral)  Ht 4\' 11"  (1.499 m)  Wt 95 lb (43.092 kg)  BMI 19.18 kg/m2 Vitals and nursing note reviewed  Physical Exam  Constitutional: She is well-developed, well-nourished, and in no distress.  Cardiovascular: Normal rate.   Pulmonary/Chest: Effort normal.  Abdominal: Soft. Bowel sounds are normal. There is no tenderness.  Musculoskeletal: She exhibits no edema.       Lumbar back: She exhibits tenderness, pain and spasm. She exhibits normal range of motion, no swelling and no deformity.  Neurological: She is alert. She has normal motor skills, normal sensation, normal strength and normal reflexes.  Skin: Skin is warm and dry. No rash noted.   Urinalysis    Component Value Date/Time   COLORURINE YELLOW 03/03/2013 0810   APPEARANCEUR CLEAR 03/03/2013 0810   LABSPEC 1.020 03/03/2013 0810   PHURINE 5.5 03/03/2013 0810   GLUCOSEU NEGATIVE 03/03/2013 0810   HGBUR NEGATIVE 03/03/2013 0810   BILIRUBINUR NEG 02/27/2015 1630   BILIRUBINUR NEGATIVE 03/03/2013 0810   KETONESUR 15* 03/03/2013 0810   PROTEINUR NEG 02/27/2015 1630   PROTEINUR NEGATIVE 03/03/2013 0810   UROBILINOGEN 0.2 02/27/2015 1630   UROBILINOGEN 0.2 03/03/2013 0810   NITRITE NEG 02/27/2015 1630   NITRITE NEGATIVE 03/03/2013 0810   LEUKOCYTESUR Negative 02/27/2015 1630    Assessment & Plan:  1. Bilateral low back pain without sciatica: Acute back pain for last 4 days. Believe she has low back strain/spasm from lifting her child. No red flags on exam.  -patient given Rx for tylenol to use as it is safe in breastfeeding moms -Rx also given for topical cream to help with pain and muscle relaxation -encouraged use of heating pad and gentle stretching -UA which was normal -handout given with return precatuions -[facility]  Meds ordered this encounter  Medications  . acetaminophen (TYLENOL) 500 MG tablet    Sig: Take 1 tablet (500 mg total) by mouth every 6 (six) hours as needed for mild pain or moderate pain.    Dispense:  30 tablet    Refill:  1  . Camphor-Menthol-Methyl Sal 06-22-28 % CREA    Sig: Apply topically to back for relief of back pain.    Dispense:  113 g    Refill:  0     Caryl AdaJazma Sonna Lipsky, DO 02/27/2015, 4:08  PM PGY-2, [place]

## 2015-02-28 ENCOUNTER — Encounter: Payer: Self-pay | Admitting: Obstetrics and Gynecology

## 2015-03-13 ENCOUNTER — Ambulatory Visit: Payer: Self-pay | Admitting: Internal Medicine

## 2015-03-18 ENCOUNTER — Encounter: Payer: Self-pay | Admitting: Family Medicine

## 2015-03-18 ENCOUNTER — Ambulatory Visit (INDEPENDENT_AMBULATORY_CARE_PROVIDER_SITE_OTHER): Payer: Self-pay | Admitting: Family Medicine

## 2015-03-18 VITALS — BP 98/60 | HR 96 | Temp 98.8°F | Ht 59.0 in | Wt 98.0 lb

## 2015-03-18 DIAGNOSIS — J069 Acute upper respiratory infection, unspecified: Secondary | ICD-10-CM

## 2015-03-18 NOTE — Progress Notes (Signed)
   Subjective:    Patient ID: Hannah Orozco, female    DOB: 05/13/1980, 35 y.o.   MRN: [id number]  HPI  [employer] video interpreter used [place] [phone]  Patient presents for Same Day Appointment  CC: cold symptoms  # URI:  Started 3 days ago  Around sick contacts during the holidays  Started with sore throat, then developed cough and runny nose  No fevers, no chills, no nausea or vomiting  Hasn't tried anything yet  No ear pain but has noticed some ear popping  Social Hx: never smoker  Review of Systems   See HPI for ROS.   Past medical history, surgical, family, and social history reviewed and updated in the EMR as appropriate.  Objective:  BP 98/60 mmHg  Pulse 96  Temp(Src) 98.8 F (37.1 C) (Oral)  Ht 4\' 11"  (1.499 m)  Wt 98 lb (44.453 kg)  BMI 19.78 kg/m2  SpO2 98% Vitals and nursing note reviewed  General: NAD HEENT: normal conjunctiva and sclera. TMs bilaterally with effusion but no erythema. Posterior pharynx mildly erythematous no exudate CV: borderline tachycardic, regular rhythm, no murmurs.  Resp: clear to auscultation bilaterally, normal effort  Assessment & Plan:   1. Viral URI Likely viral. OTC symptom management, hydration, hand hygiene. Went over AVS with interpreter. Follow up as needed.

## 2015-03-18 NOTE — Patient Instructions (Signed)
Your symptoms are due to a viral illness. Antibiotics will not help improve your symptoms, but the following will help you feel better while your body fights the virus.   Stay hydrated - drink a lot of water   Nasal Saline Spray  Congestion:   Nose spray: Afrin (Phenylephrine). DO NOT USE MORE THAN 3 DAYS  Oral: Pseudoephedrine  Sneezing & Runny nose: Cetirizine (Zyrtec), Fexofenadine (Allegra), Loratadine (Claritin)  Pain/Sore throat: Tylenol, Ibuprofen  Cough: Dextromethorphan, (Guaifenesin, "Mucinex") & Albuterol  Wash your hands often to prevent spreading the virus   Sore throat: take Ibuprofen, Aleve, or Tylenol  Cough drops:

## 2015-03-24 ENCOUNTER — Ambulatory Visit: Payer: Self-pay | Admitting: Family Medicine

## 2015-03-25 ENCOUNTER — Ambulatory Visit (INDEPENDENT_AMBULATORY_CARE_PROVIDER_SITE_OTHER): Payer: Self-pay | Admitting: Family Medicine

## 2015-03-25 ENCOUNTER — Encounter: Payer: Self-pay | Admitting: Family Medicine

## 2015-03-25 VITALS — BP 103/65 | HR 100 | Temp 97.9°F | Wt 96.8 lb

## 2015-03-25 DIAGNOSIS — Z3009 Encounter for other general counseling and advice on contraception: Secondary | ICD-10-CM

## 2015-03-25 DIAGNOSIS — J0100 Acute maxillary sinusitis, unspecified: Secondary | ICD-10-CM

## 2015-03-25 MED ORDER — MEDROXYPROGESTERONE ACETATE 150 MG/ML IM SUSP
150.0000 mg | Freq: Once | INTRAMUSCULAR | Status: AC
  Administered 2015-03-25: 150 mg via INTRAMUSCULAR

## 2015-03-25 MED ORDER — AMOXICILLIN-POT CLAVULANATE 875-125 MG PO TABS
1.0000 | ORAL_TABLET | Freq: Two times a day (BID) | ORAL | Status: DC
Start: 2015-03-25 — End: 2015-08-29

## 2015-03-25 NOTE — Assessment & Plan Note (Signed)
Cough, congestion x2 weeks, facial pain x4 days, no fevers, L maxillary tenderness, mold exposure at home and entire family sick - likely secondary bacterial infection of maxillary sinus - augmentin x2 weeks - some concern for fungal infection given heavy exposure, patient cautioned to RTC for worsening or failure to improve on antibiotics

## 2015-03-25 NOTE — Patient Instructions (Signed)
Sinusitis, adultos  (Sinusitis, Adult)   La sinusitis es la irritación, dolor, e hinchazón (inflamación) de las cavidades de aire en los huesos de la cara (senos paranasales). La irritación, el dolor, e hinchazón puede hacer que el aire y el moco se queden atrapados en los senos paranasales. Esto hace que los gérmenes se multipliquen y causen una infección.   CUIDADOS EN EL HOGAR   · Beba gran cantidad de líquido para mantener el pis (orina) de tono claro o amarillo pálido.  · Use un humidificador en su hogar.  · Deje correr el agua caliente de la ducha para producir vapor en el baño. Siéntese en el baño con la puerta cerrada. Inhale vapor de agua 3 a 4 veces al día.  · Ponga un paño caliente y húmedo en el rostro 3 a 4 veces al día, o según las indicaciones de su médico.  · Use aerosoles de agua salada (aerosoles de solución salina) para humedecer las secreciones nasales espesas. Esto puede ayudar al drenaje de los senos paranasales.  · Sólo tome los medicamentos que le indique el médico.  SOLICITE AYUDA DE INMEDIATO SI:   · El dolor empeora.  · Siente un dolor de cabeza intenso.  · Tiene malestar estomacal (náuseas).  · Vomita.  · Tiene mucho sueño (somnolencia).  · El rostro está inflamado (hinchado).  · Hay cambios en la visión.  · Presenta rigidez en el cuello.  · Tiene dificultad para respirar.  ASEGÚRESE DE QUE:   · Comprende estas instrucciones.  · Controlará su enfermedad.  · Solicitará ayuda de inmediato si no mejora o si empeora.     Esta información no tiene como fin reemplazar el consejo del médico. Asegúrese de hacerle al médico cualquier pregunta que tenga.     Document Released: 11/24/2011 Document Revised: 07/16/2014  Elsevier Interactive Patient Education ©2016 Elsevier Inc.

## 2015-03-25 NOTE — Progress Notes (Signed)
   Subjective:   Hannah Orozco is a 35 y.o. female with a history of recent URI here for facial pain.  Pt reports at least 2 weeks of cough, congestion, rhinorrhea. Saturday she developed pain in the left side of her face from her ear down into her jaw. The pain is fairly constant and relieved by tylenol or ibuprofen. Nothing makes it better or worse. She denies fever.   Her entire family is having cough and upper respiratory symptoms that are likely related to heavy mold growth in her apartment. She requests a letter to her landlord regarding the health affects of this exposure which I have provided.  Review of Systems:  Per HPI. All other systems reviewed and are negative.   PMH, PSH, Medications, Allergies, and FmHx reviewed and updated in EMR.  Social History: never smoker  Objective:  BP 103/65 mmHg  Pulse 100  Temp(Src) 97.9 F (36.6 C) (Oral)  Wt 96 lb 12.8 oz (43.908 kg)  Gen:  35 y.o. female in NAD HEENT: NCAT, MMM, EOMI, PERRL, anicteric sclerae, L maxillary sinus tenderness, several filled cavities and some inflammation at the base of left lower premolar, crusted rhinorrhea CV: RRR, no MRG, no JVD Resp: Non-labored, CTAB, no wheezes noted Abd: Soft, NTND, BS present, no guarding or organomegaly Ext: WWP, no edema MSK: Full ROM, strength intact Neuro: Alert and oriented, speech normal      Chemistry      Component Value Date/Time   NA 135 08/01/2014 1131   K 3.8 08/01/2014 1131   CL 103 08/01/2014 1131   CO2 24 08/01/2014 1131   BUN 7 08/01/2014 1131   CREATININE 0.43* 08/01/2014 1131   CREATININE 0.70 04/15/2013 0601      Component Value Date/Time   CALCIUM 8.5 08/01/2014 1131   ALKPHOS 121* 08/01/2014 1131   AST 16 08/01/2014 1131   ALT 10 08/01/2014 1131   BILITOT 0.4 08/01/2014 1131      Lab Results  Component Value Date   WBC 8.0 09/15/2014   HGB 9.1* 09/15/2014   HCT 27.9* 09/15/2014   MCV 95.2 09/15/2014   PLT 94* 09/15/2014   Lab  Results  Component Value Date   TSH 1.626 11/09/2013   No results found for: HGBA1C Assessment & Plan:     Hannah Orozco is a 35 y.o. female here for facial pain  Sinusitis, acute maxillary Cough, congestion x2 weeks, facial pain x4 days, no fevers, L maxillary tenderness, mold exposure at home and entire family sick - likely secondary bacterial infection of maxillary sinus - augmentin x2 weeks - some concern for fungal infection given heavy exposure, patient cautioned to RTC for worsening or failure to improve on antibiotics   Beverely LowElena Adamo, MD, MPH [facility] PGY-3 03/25/2015 2:32 PM

## 2015-03-26 ENCOUNTER — Telehealth: Payer: Self-pay | Admitting: Family Medicine

## 2015-03-26 DIAGNOSIS — J0100 Acute maxillary sinusitis, unspecified: Secondary | ICD-10-CM

## 2015-03-26 NOTE — Telephone Encounter (Signed)
Patient asks PCP if it is possible to change Amoxicilin for a cheaper prescription. Please, follow up with Patient (Spanish).

## 2015-03-27 MED ORDER — AMOXICILLIN 500 MG PO TABS: 500.0000 mg | ORAL_TABLET | Freq: Three times a day (TID) | ORAL | Status: DC

## 2015-03-27 NOTE — Telephone Encounter (Signed)
I sent in another rx for amox without clavulonate that should only cost $4 but is not quite as good. If she doesn't get better she should let me know and we can switch it. Thanks!

## 2015-04-11 ENCOUNTER — Ambulatory Visit: Payer: Self-pay

## 2015-06-05 ENCOUNTER — Ambulatory Visit: Payer: Self-pay

## 2015-06-12 ENCOUNTER — Ambulatory Visit (INDEPENDENT_AMBULATORY_CARE_PROVIDER_SITE_OTHER): Payer: Self-pay | Admitting: *Deleted

## 2015-06-12 DIAGNOSIS — Z3042 Encounter for surveillance of injectable contraceptive: Secondary | ICD-10-CM

## 2015-06-12 MED ORDER — MEDROXYPROGESTERONE ACETATE 150 MG/ML IM SUSP
150.0000 mg | Freq: Once | INTRAMUSCULAR | Status: AC
  Administered 2015-06-12: 150 mg via INTRAMUSCULAR

## 2015-06-12 NOTE — Progress Notes (Signed)
   Pt in for Depo Provera injection.  Pt tolerated Depo injection. Depo given right upper outer quadrant.  Next injection due June 15-29, 2017.  Reminder card given. Clovis PuMartin, Tamika L, RN

## 2015-08-29 ENCOUNTER — Ambulatory Visit (INDEPENDENT_AMBULATORY_CARE_PROVIDER_SITE_OTHER): Payer: Self-pay | Admitting: Family Medicine

## 2015-08-29 ENCOUNTER — Encounter: Payer: Self-pay | Admitting: Family Medicine

## 2015-08-29 VITALS — BP 108/61 | HR 90 | Temp 98.2°F | Wt 98.1 lb

## 2015-08-29 DIAGNOSIS — N644 Mastodynia: Secondary | ICD-10-CM

## 2015-08-29 DIAGNOSIS — K219 Gastro-esophageal reflux disease without esophagitis: Secondary | ICD-10-CM

## 2015-08-29 MED ORDER — RANITIDINE HCL 150 MG PO TABS: 150.0000 mg | ORAL_TABLET | Freq: Two times a day (BID) | ORAL | Status: DC | PRN

## 2015-08-29 NOTE — Patient Instructions (Addendum)
Puede dejar de amamantar y [place] a dar la nina leche de vaca ([place]). Si el dolor sigue despues o si tiene otros cambios en el pecho, llama para otra cita.

## 2015-09-02 NOTE — Progress Notes (Signed)
   Subjective:   Hannah Orozco is a 35 y.o. female with a history of fibroadenosis and mastitis 2 years ago here for breast pain  Pt reports 2 weeks of pain at the end of breast feeding. She has been breastfeeding her infant for nearly 12 months without pain but just started having pain in her left breast when the baby finishes. She stopped breastfeeding on that side 4 days ago and hasn't had any pain since. There is no swelling, lump, skin changes, warmth or redness. No fever. Pt asks if it is ok to stop breastfeeding now.  Review of Systems:  Per HPI. All other systems reviewed and are negative.   PMH, PSH, Medications, Allergies, and FmHx reviewed and updated in EMR.  Social History: never smoker  Objective:  BP 108/61 mmHg  Pulse 90  Temp(Src) 98.2 F (36.8 C) (Oral)  Wt 98 lb 1.6 oz (44.498 kg)  SpO2 100%  Gen:  35 y.o. female in NAD HEENT: NCAT, MMM, anicteric sclerae CV: RRR, no MRG Resp: Non-labored, CTAB, no wheezes noted Abd: Soft, NTND, BS present, no guarding or organomegaly Ext: WWP, no edema Breast: Normally bilaterally without masses or skin changes, larger on the right due to breast milk,  Neuro: Alert and oriented, speech normal    Assessment & Plan:     Hannah Orozco is a 35 y.o. female here for breast pain  Breast pain, left Pain at the end of breastfeeding only, started about 2 weeks ago, stopped breastfeeding on that side 4 days ago and no pain since, no lump, bloody discharge, tenderness, normal exam - pt asks if ok to stop breastfeeding, child will be a year in 2 weeks, ok to transition to whole milk now - f/u if any more pain or lump, skin change, etc      Beverely LowElena Adamo, MD, MPH [facility] PGY-3 09/02/2015 7:51 AM

## 2015-09-02 NOTE — Assessment & Plan Note (Signed)
Pain at the end of breastfeeding only, started about 2 weeks ago, stopped breastfeeding on that side 4 days ago and no pain since, no lump, bloody discharge, tenderness, normal exam - pt asks if ok to stop breastfeeding, child will be a year in 2 weeks, ok to transition to whole milk now - f/u if any more pain or lump, skin change, etc

## 2015-09-04 ENCOUNTER — Ambulatory Visit: Payer: Self-pay

## 2015-09-12 ENCOUNTER — Ambulatory Visit (INDEPENDENT_AMBULATORY_CARE_PROVIDER_SITE_OTHER): Payer: Self-pay | Admitting: *Deleted

## 2015-09-12 DIAGNOSIS — Z3042 Encounter for surveillance of injectable contraceptive: Secondary | ICD-10-CM

## 2015-09-12 LAB — POCT URINE PREGNANCY: Preg Test, Ur: NEGATIVE

## 2015-09-12 MED ORDER — MEDROXYPROGESTERONE ACETATE 150 MG/ML IM SUSY
150.0000 mg | PREFILLED_SYRINGE | Freq: Once | INTRAMUSCULAR | Status: AC
  Administered 2015-09-12: 150 mg via INTRAMUSCULAR

## 2015-09-12 NOTE — Progress Notes (Signed)
   Pt one day late for Depo Provera injection.  Pregnancy ordered; result negative .Pt tolerated Depo injection. Depo given left upper outer quadrant.  Next injection due Sept 15-29, 2017.  Reminder card given. Clovis PuMartin, Tamika L, RN

## 2015-11-13 ENCOUNTER — Ambulatory Visit (INDEPENDENT_AMBULATORY_CARE_PROVIDER_SITE_OTHER): Payer: Self-pay | Admitting: Family Medicine

## 2015-11-13 ENCOUNTER — Ambulatory Visit: Payer: Self-pay | Admitting: Family Medicine

## 2015-11-13 ENCOUNTER — Ambulatory Visit: Payer: Self-pay

## 2015-11-13 DIAGNOSIS — G4452 New daily persistent headache (NDPH): Secondary | ICD-10-CM | POA: Insufficient documentation

## 2015-11-13 NOTE — Progress Notes (Signed)
    Subjective:    Patient ID: Hannah Orozco, female    DOB: 01/16/1981, 35 y.o.   MRN: [id number]   CC: headache behind right ear for past 3 weeks  HPI: began 3 weeks ago and has been constant every day. On right side of head behind ear. Sometimes radiates to involve front of head as well. Pain is sharp in nature. No bumps or rashes, no fevers or recent illnesses, no sick contacts. No ear pain or decreased hearing. No vision changes. Denies nausea or vomiting. Has not tried to take any medicines because she had acid reflux and medicines usually upset her stomach more. Massaging the area makes it better. Bending down to pick things up make it worse. Does have some slight neck pain more on the right side. Denies injury. Does not wake her from sleep. No sensitivity to light. No signs of infection. No focal neuro deficits that she has noticed including numbness/tingling, weakness, vision changes, facial droop. Otherwise feels well. Has had headaches in past but they are usually all over the head, not in one specific area.   Smoking status reviewed- non smoker  Review of Systems- see HPI   Objective:  There were no vitals taken for this visit. Vitals and nursing note reviewed  General: well nourished, in no acute distress HEENT: normocephalic, TM's visualized bilaterally without erythema or drainage, no scleral icterus or conjunctival pallor, no nasal discharge, moist mucous membranes, good dentition without erythema or discharge noted in posterior oropharynx. Extra ocular movements in tact bilaterally. PERRL Neck: supple, non-tender, without lymphadenopathy Cardiac: RRR, clear S1 and S2, no murmurs, rubs, or gallops Respiratory: clear to auscultation bilaterally, no increased work of breathing Extremities: no edema or cyanosis. Warm, well perfused. Skin: warm and dry, no rashes noted Neuro: alert and oriented, no focal deficits, no sensory deficits, normal gait and 5/5 strength in upper and  lower extremities bilaterally   Assessment & Plan:    New daily persistent headache  Most likely tension, improves with massage, some right sided neck pain as well. No alarm symptoms. Will treat conservatively. Return precautions given.  -advised to take tylenol for pain, continue massaging area for relief, can try warm or cold compresses to area to help with pain -if headache worsens or she develops fever advised to go to ED -follow up if tylenol does not improve    Return if symptoms worsen or fail to improve.   Dolores PattyAngela Emanie Behan, DO Family Medicine Resident PGY-1

## 2015-11-13 NOTE — Patient Instructions (Signed)
Cefalea tensional (Tension Headache) Una cefalea tensional es un dolor o presin que se siente en la frente y los lados de la cabeza. Estas cefaleas pueden durar de 30minutos a varios das. CUIDADOS EN EL HOGAR Control del dolor  Tome los medicamentos de venta libre y los recetados solamente como se lo haya indicado el mdico.  Cuando sienta dolor de cabeza acustese en un cuarto oscuro y tranquilo.  Si se lo indican, aplquese hielo en la zona de la cabeza y el cuello:  Ponga el hielo en una bolsa plstica.  Coloque una toalla entre la piel y la bolsa de hielo.  Coloque el hielo durante 20minutos, 2 a 3veces por da.  Utilice una almohadilla trmica o tome una ducha caliente para aplicar calor en la zona de la cabeza y el cuello segn las indicaciones del mdico. Comida y bebida  Mantenga un horario para las comidas.  No beba mucho alcohol.  No consuma mucha cafena ni deje de consumirla por completo. Instrucciones generales  Concurra a todas las visitas de control como se lo haya indicado el mdico. Esto es importante.  Lleve un registro diario para averiguar si ciertas cosas provocan los dolores de cabeza. Por ejemplo, escriba los siguientes datos:  Lo que usted come y bebe.  Cunto tiempo duerme.  Algn cambio en su dieta o en los medicamentos.  Pruebe recibir un masaje o hacer otras cosas que lo ayuden a relajarse.  Disminuya el nivel de estrs.  Sintese con la espalda recta. No contraiga (tensione) los msculos.  No consuma productos que contengan tabaco. Estos incluyen cigarrillos, tabaco para mascar y cigarrillos electrnicos. Si necesita ayuda para dejar de fumar, consulte al mdico.  Haga ejercicios con regularidad tal como se lo indic el mdico.  Duerma lo suficiente. Es decir, entre 7 y 9horas de sueo. SOLICITE AYUDA SI:  Los medicamentos no logran aliviar los sntomas.  Tiene un dolor de cabeza que es diferente del dolor de cabeza  habitual.  Tiene malestar estomacal (nuseas) o vomita.  Tiene fiebre. SOLICITE AYUDA DE INMEDIATO SI:  La cefalea es muy intensa.  Sigue vomitando.  Presenta rigidez en el cuello.  Tiene dificultad para ver.  Tiene dificultad para hablar.  Siente dolor en el ojo o en el odo.  Sus msculos estn dbiles, o pierde el control muscular.  Pierde el equilibrio o tiene problemas para caminar.  Siente que se desvanece (pierde el conocimiento) o se desmaya.  Se siente confundido.   Esta informacin no tiene como fin reemplazar el consejo del mdico. Asegrese de hacerle al mdico cualquier pregunta que tenga.   Document Released: 05/24/2011 Document Revised: 11/20/2014 Elsevier Interactive Patient Education 2016 Elsevier Inc.  

## 2015-11-13 NOTE — Assessment & Plan Note (Addendum)
  Most likely tension, improves with massage, some right sided neck pain as well. No alarm symptoms. Will treat conservatively. Return precautions given.  -advised to take tylenol for pain, continue massaging area for relief, can try warm or cold compresses to area to help with pain -if headache worsens or she develops fever advised to go to ED -follow up if tylenol does not improve

## 2015-12-03 ENCOUNTER — Ambulatory Visit (INDEPENDENT_AMBULATORY_CARE_PROVIDER_SITE_OTHER): Payer: Self-pay | Admitting: Family Medicine

## 2015-12-03 ENCOUNTER — Encounter: Payer: Self-pay | Admitting: Family Medicine

## 2015-12-03 DIAGNOSIS — K0401 Reversible pulpitis: Secondary | ICD-10-CM | POA: Insufficient documentation

## 2015-12-03 MED ORDER — AMOXICILLIN-POT CLAVULANATE 875-125 MG PO TABS: 1.0000 | ORAL_TABLET | Freq: Two times a day (BID) | ORAL | 0 refills | Status: DC

## 2015-12-03 NOTE — Assessment & Plan Note (Signed)
Patient is likely experiencing acute pulpitis of the upper molars on the right side. No systemic symptoms at this time. No CNS findings at this time. - Will treat with Augmentin twice a day 10 days - Tylenol for pain - I've asked patient to set up an appointment in 1-2 weeks with her dentist. Patient stated her understanding. - Return precautions discussed

## 2015-12-03 NOTE — Patient Instructions (Addendum)
Take the prescribed Augmentin twice a day every day for 10 days. This medication is safe while breast-feeding. You may also take Tylenol for pain, inflammation, and fevers. I would like for you to set up an appointment with your dentist within the next 1-2 weeks.  Absceso dental (Dental Abscess) Un absceso dental es la acumulacin de pus en una pieza dental o alrededor de esta. CAUSAS La causa de la afeccin es una infeccin bacteriana alrededor de la raz de la pieza dental que compromete la parte interior del diente (pulpa dental). Puede presentarse como consecuencia de:  Caries importantes.  Traumatismo en el diente que permite el ingreso de bacterias a la pulpa, como una pieza dental rota o astillada.  Enfermedad grave de las encas alrededor de una pieza dental. SNTOMAS Los sntomas de esta afeccin incluyen lo siguiente:  Dolor intenso en la pieza dental infectada o alrededor de esta.  Hinchazn y enrojecimiento alrededor de la pieza dental infectada, en la boca o en el rostro.  Dolor a Insurance claims handlerla palpacin.  Secrecin de pus.  Mal aliento.  Sabor amargo en la boca.  Dificultad para tragar.  Dificultad para abrir Government social research officerla boca.  Nuseas.  Vmitos.  Escalofros.  Ganglios del cuello inflamados.  Grant RutsFiebre. DIAGNSTICO Esta afeccin se diagnostica al examinar la pieza dental infectada. Durante el examen, el dentista puede darle golpecitos suaves en la pieza dental infectada. Adems, le har preguntas sobre sus antecedentes dentales y mdicos, y tal vez le indique que se haga radiografas. TRATAMIENTO El tratamiento de la afeccin consiste en eliminar la infeccin. Esto se puede hacer de las siguientes maneras:  Antibiticos.  Un tratamiento de conductos, que puede realizarse para salvar la pieza dental.  La extraccin de la pieza dental que tambin puede incluir el drenaje del absceso. La extraccin se hace si no es posible salvar la pieza dental. INSTRUCCIONES PARA EL  CUIDADO EN EL HOGAR  Tome los medicamentos solamente como se lo haya indicado el dentista.  Si le recetaron antibiticos, asegrese de terminarlos, incluso si comienza a Actorsentirse mejor.  Enjuguese la boca (haga grgaras) frecuentemente con agua con sal para aliviar el dolor o la hinchazn.  No conduzca ni opere maquinaria pesada mientras toma analgsicos.  No se aplique calor en la parte externa de la boca.  Concurra a todas las visitas de control como se lo haya indicado el dentista. Esto es importante. SOLICITE ATENCIN MDICA SI:  El dolor es ms intenso y no se alivia con los medicamentos. SOLICITE ATENCIN MDICA DE INMEDIATO SI:  Tiene fiebre o siente escalofros.  Los sntomas empeoran repentinamente.  Siente un dolor de cabeza muy intenso.  Tiene problemas para respirar o tragar.  Tiene dificultad para abrir Government social research officerla boca.  Nota hinchazn en el cuello o alrededor del ojo.   Esta informacin no tiene Theme park managercomo fin reemplazar el consejo del mdico. Asegrese de hacerle al mdico cualquier pregunta que tenga.   Document Released: 03/01/2005 Document Revised: 07/16/2014 Elsevier Interactive Patient Education [employer] Elsevier Inc.

## 2015-12-03 NOTE — Progress Notes (Signed)
   HPI  CC: gum pain.  Right upper gum pain. Some pain along the outer cheek as well. Duration: 2 days No trauma/injury Some feelings of swelling in her mouth. Hurts to chew. Still able to eat and drink. Never had in the past.   Brushes 2x a day and flosses.  Last dental visit 1 yr ago.   No fever/chill/n/v/d Denies headache, blurred vision, dizziness, jaw pain, neck stiffness, shortness of breath, chest pain, heartburn, or rhinorrhea  Review of Systems   See HPI for ROS.   CC, SH/smoking status, and VS noted  Objective: BP 106/63   Pulse 86   Temp 98.4 F (36.9 C) (Oral)   Ht 4\' 11"  (1.499 m)   Wt 103 lb 3.2 oz (46.8 kg)   BMI 20.84 kg/m  Gen: NAD, alert, cooperative, and pleasant. HEENT: NCAT, EOMI, PERRL, MMM, TMs clear bilaterally, buccal swelling with slight inflammation along the right upper jaw and gum line, no evidence of drainage or trauma, tenderness to palpation along the right maxilla and zygomatic process. Normal sinus luminescence bilaterally. CV: RRR, no murmur Resp: CTAB, no wheezes, non-labored Ext: No edema, warm   Assessment and plan:  Acute pulpitis Patient is likely experiencing acute pulpitis of the upper molars on the right side. No systemic symptoms at this time. No CNS findings at this time. - Will treat with Augmentin twice a day 10 days - Tylenol for pain - I've asked patient to set up an appointment in 1-2 weeks with her dentist. Patient stated her understanding. - Return precautions discussed   Meds ordered this encounter  Medications  . amoxicillin-clavulanate (AUGMENTIN) 875-125 MG tablet    Sig: Take 1 tablet by mouth 2 (two) times daily.    Dispense:  20 tablet    Refill:  0     Kathee DeltonIan D McKeag, MD,MS,  PGY3 12/03/2015 6:59 PM

## 2015-12-17 ENCOUNTER — Ambulatory Visit: Payer: Self-pay | Attending: Internal Medicine

## 2016-01-09 ENCOUNTER — Telehealth: Payer: Self-pay | Admitting: Internal Medicine

## 2016-01-09 NOTE — Telephone Encounter (Signed)
Patient asks PCP dental referral, she requested it at previous appointment. Please, follow up (Spanish).

## 2016-01-12 NOTE — Telephone Encounter (Signed)
Orange Card expired, will mail patient a Patent attorneydental list. Hannah DieterRosa could you inform patient that I will send her a list of dentists and she can scheduled her own dental appointment. Also please let her know that once she renews her orange card we can refer her to the dental clinic.

## 2016-01-13 ENCOUNTER — Encounter: Payer: Self-pay | Admitting: Family Medicine

## 2016-01-13 ENCOUNTER — Ambulatory Visit (INDEPENDENT_AMBULATORY_CARE_PROVIDER_SITE_OTHER): Payer: Self-pay | Admitting: Family Medicine

## 2016-01-13 DIAGNOSIS — J069 Acute upper respiratory infection, unspecified: Secondary | ICD-10-CM | POA: Insufficient documentation

## 2016-01-13 NOTE — Assessment & Plan Note (Signed)
Acute. Afebrile with upper respiratory involvement. Appears to be viral in nature. Does not appear bacterial or like the flu.  - Tylenol PRN for fevers and pain - Robitussin OTC for cough suppression - Red flags discussed when seek medial attention

## 2016-01-13 NOTE — Progress Notes (Signed)
    Subjective:   Patient ID: Hannah Orozco    DOB: 07/31/1980, 35 y.o. female   MRN: [id number]  CC: Cough  HPI: Hannah DionesMaria L Boesel is a 35 y.o. female who presents to clinic today for SDA concerning cough. Problems discussed today are as follows:  Cough: onset 3 days ago with sore throat, dry cough, and generalized malaise. Denies fevers, chills, change in appetite, headache, dyspnea, chest pain, abdominal pain, nausea, vomiting, diarrhea, or joint or muscle pain. Does endorse sick contacts including two children.   ROS: Complete ROS performed, see HPI for pertinent ROS.  PMFSH: Pertinent past medical, surgical, family, and social history were reviewed and updated as appropriate. Smoking status reviewed.  Medications reviewed. Current Outpatient Prescriptions  Medication Sig Dispense Refill  . amoxicillin-clavulanate (AUGMENTIN) 875-125 MG tablet Take 1 tablet by mouth 2 (two) times daily. 20 tablet 0  . Camphor-Menthol-Methyl Sal 06-22-28 % CREA Apply topically to back for relief of back pain. 113 g 0  . Prenatal Vit-Fe Fumarate-FA (PRENATAL MULTIVITAMIN) TABS tablet Take 1 tablet by mouth daily. 30 tablet 3  . ranitidine (ZANTAC) 150 MG tablet Take 1 tablet (150 mg total) by mouth 2 (two) times daily as needed for heartburn. 60 tablet 12   No current facility-administered medications for this visit.     Objective:   BP 105/69   Pulse 91   Temp 98.5 F (36.9 C) (Oral)   Ht 4\' 11"  (1.499 m)   Wt 103 lb 12.8 oz (47.1 kg)   BMI 20.97 kg/m  Vitals and nursing note reviewed.  General: well nourished, well developed, in no acute distress with non-toxic appearance HEENT: normocephalic, atraumatic, moist mucous membranes, erythematous pharynx w/o purulance Neck: supple, non-tender without lymphadenopathy CV: regular rate and rhythm without murmurs, rubs, or gallops, no lower extremity edema Lungs: clear to auscultation bilaterally with normal work of breathing Abdomen: soft,  non-tender, non-distended, no masses or organomegaly palpable, normoactive bowel sounds Skin: warm, dry, no rashes or lesions, cap refill < 2 seconds Extremities: warm and well perfused, normal tone  Assessment & Plan:   No problem-specific Assessment & Plan notes found for this encounter.  No orders of the defined types were placed in this encounter.  No orders of the defined types were placed in this encounter.   Durward Parcelavid Zoee Heeney, DO [facility], PGY-1 01/13/2016 3:10 PM

## 2016-01-13 NOTE — Patient Instructions (Addendum)
Fue Optometristun placer conocerte hoy. Vea a continuacin para revisar nuestro plan para la visita de hoy.  1. Te ests recuperando de una infeccin viral. Esto suceder solo sin antibiticos. 2. Puede tomar Tylenol si tiene fiebre de 100.94F o ms. 3. Puede tomar Robitussin en el mostrador para la tos segn sea necesario para ayudar a dormir. Tambin prueba miel en el t.  Llame a la clnica al [phone] si sus sntomas empeoran o si tiene alguna inquietud. Fue Actorun placer verte. - Durward Parcelavid Anaaya Fuster, DO [facility], PGY-1  Infeccin del tracto respiratorio superior, adultos (Upper Respiratory Infection, Adult) La mayora de las infecciones del tracto respiratorio superior son infecciones virales de las vas que llevan el aire a los pulmones. Un infeccin del tracto respiratorio superior afecta la nariz, la garganta y las vas respiratorias superiores. El tipo ms frecuente de infeccin del tracto respiratorio superior es la nasofaringitis, que habitualmente se conoce como "resfro comn". Las infecciones del tracto respiratorio superior siguen su curso y por lo general se curan solas. En la International Business Machinesmayora de los casos, la infeccin del tracto respiratorio superior no requiere atencin Helmvillemdica, Biomedical engineerpero a veces, despus de una infeccin viral, puede surgir una infeccin bacteriana en las vas respiratorias superiores. Esto se conoce como infeccin secundaria. Las infecciones sinusales y en el odo medio son tipos frecuentes de infecciones secundarias en el tracto respiratorio superior. La neumona bacteriana tambin puede complicar un cuadro de infeccin del tracto respiratorio superior. Este tipo de infeccin puede empeorar el asma y la enfermedad pulmonar obstructiva crnica (EPOC). En algunos casos, estas complicaciones pueden requerir atencin mdica de emergencia y poner en peligro la vida.  CAUSAS Casi todas las infecciones del tracto respiratorio superior se deben a los virus. Un virus es un tipo de  microbio que puede contagiarse de Neomia Dearuna persona a Educational psychologistotra.  FACTORES DE RIESGO Puede estar en riesgo de sufrir una infeccin del tracto respiratorio superior si:   Fuma.  Tiene una enfermedad pulmonar o cardaca crnica.  Tiene debilitado el sistema de defensa (inmunitario) del cuerpo.  Es [address] [place] Entrancemuy joven o de edad muy Anthonavanzada.  Tiene asma o alergias nasales.  Trabaja en reas donde hay mucha gente o poca ventilacin.  Rudi Cocorabaja en una escuela o en un centro de atencin mdica. SIGNOS Y SNTOMAS  Habitualmente, los sntomas aparecen de 2a 3das despus de entrar en contacto con el virus del resfro. La mayora de las infecciones virales en el tracto respiratorio superior duran de 7a 10das. Sin embargo, las infecciones virales en el tracto respiratorio superior a causa del virus de la gripe pueden durar de 14a 18das y, habitualmente, son ms graves. Entre los sntomas se pueden incluir los siguientes:   Secrecin o congestin nasal.  Estornudos.  Tos.  Dolor de Advertising copywritergarganta.  Dolor de Turkmenistancabeza.  Fatiga.  Grant RutsFiebre.  Prdida del apetito.  Dolor en la frente, detrs de los ojos y por encima de los pmulos (dolor sinusal).  Dolores musculares. DIAGNSTICO  El mdico puede diagnosticar una infeccin del tracto respiratorio superior mediante los siguientes estudios:  Examen fsico.  Pruebas para verificar si los sntomas no se deben a otra afeccin, por ejemplo:  Faringitis estreptoccica.  Sinusitis.  Neumona.  Asma. TRATAMIENTO  Esta infeccin desaparece sola, con el tiempo. No puede curarse con medicamentos, pero a menudo se prescriben para aliviar los sntomas. Los medicamentos pueden ser tiles para lo siguiente:  Personal assistantBajar la fiebre.  Reducir la tos.  Aliviar la congestin nasal. INSTRUCCIONES PARA EL CUIDADO  EN EL HOGAR   Tome los medicamentos solamente como se lo haya indicado el mdico.  A fin de Engineer, materialsaliviar el dolor de garganta, haga grgaras con solucin salina  templada o consuma caramelos para la tos, como se lo haya indicado el mdico.  Use un humidificador de vapor clido o inhale el vapor de la ducha para aumentar la humedad del aire. Esto facilitar la respiracin.  Beba suficiente lquido para Photographermantener la orina clara o de color amarillo plido.  Consuma sopas y otros caldos transparentes, y Abbott Laboratoriesalimntese bien.  Descanse todo lo que sea necesario.  Regrese al Aleen Campitrabajo cuando la temperatura se le haya normalizado o cuando el mdico lo autorice. Es posible que deba quedarse en su casa durante un tiempo prolongado, para no infectar a los dems. Tambin puede usar un barbijo y lavarse las manos con cuidado para Transport plannerevitar la propagacin del virus.  Aumente el uso del inhalador si tiene asma.  No consuma ningn producto que contenga tabaco, lo que incluye cigarrillos, tabaco de Theatre managermascar o Administrator, Civil Servicecigarrillos electrnicos. Si necesita ayuda para dejar de fumar, consulte al American Expressmdico. PREVENCIN  La mejor manera de protegerse de un resfro es mantener una higiene Leanderadecuada.   Evite el contacto oral o fsico con personas que tengan sntomas de resfro.  En caso de contacto, lvese las manos con frecuencia. No hay pruebas claras de que la vitaminaC, la vitaminaE, la equincea o el ejercicio reduzcan la probabilidad de Primary school teachercontraer un resfro. Sin embargo, siempre se recomienda Insurance account managerdescansar mucho, hacer ejercicio y Engineering geologistalimentarse bien.  SOLICITE ATENCIN MDICA SI:   Su estado empeora en lugar de mejorar.  Los medicamentos no Estate agentlogran controlar los sntomas.  Tiene escalofros.  La sensacin de falta de aire empeora.  Tiene mucosidad marrn o roja.  Tiene secrecin nasal amarilla o marrn.  Le duele la cara, especialmente al inclinarse hacia adelante.  Tiene fiebre.  Tiene los ganglios del cuello hinchados.  Siente dolor al tragar.  Tiene zonas blancas en la parte de atrs de la garganta. SOLICITE ATENCIN MDICA DE INMEDIATO SI:   Tiene sntomas intensos o  persistentes de:  Dolor de Turkmenistancabeza.  Dolor de odos.  Dolor sinusal.  Dolor en el pecho.  Tiene enfermedad pulmonar crnica y cualquiera de estos sntomas:  Sibilancias.  Tos prolongada.  Tos con sangre.  Cambio en la mucosidad habitual.  Presenta rigidez en el cuello.  Tiene cambios en:  La visin.  La audicin.  El pensamiento.  El Forksestado de nimo. ASEGRESE DE QUE:   Comprende estas instrucciones.  Controlar su afeccin.  Recibir ayuda de inmediato si no mejora o si empeora.   Esta informacin no tiene Theme park managercomo fin reemplazar el consejo del mdico. Asegrese de hacerle al mdico cualquier pregunta que tenga.   Document Released: 12/09/2004 Document Revised: 07/16/2014 Elsevier Interactive Patient Education [employer]2016 Elsevier Inc.

## 2016-04-13 ENCOUNTER — Ambulatory Visit: Payer: Self-pay | Admitting: Internal Medicine

## 2016-05-12 ENCOUNTER — Ambulatory Visit: Payer: Self-pay | Admitting: Internal Medicine

## 2016-05-21 ENCOUNTER — Telehealth: Payer: Self-pay | Admitting: *Deleted

## 2016-06-01 ENCOUNTER — Ambulatory Visit: Payer: Self-pay | Admitting: Internal Medicine

## 2016-06-03 ENCOUNTER — Ambulatory Visit (INDEPENDENT_AMBULATORY_CARE_PROVIDER_SITE_OTHER): Payer: Self-pay | Admitting: Internal Medicine

## 2016-06-03 ENCOUNTER — Encounter: Payer: Self-pay | Admitting: Internal Medicine

## 2016-06-03 ENCOUNTER — Encounter: Payer: Self-pay | Admitting: Licensed Clinical Social Worker

## 2016-06-03 VITALS — BP 108/62 | HR 78 | Temp 98.4°F | Ht 59.0 in | Wt 103.0 lb

## 2016-06-03 DIAGNOSIS — R5383 Other fatigue: Secondary | ICD-10-CM

## 2016-06-03 DIAGNOSIS — K219 Gastro-esophageal reflux disease without esophagitis: Secondary | ICD-10-CM

## 2016-06-03 DIAGNOSIS — G44209 Tension-type headache, unspecified, not intractable: Secondary | ICD-10-CM

## 2016-06-03 MED ORDER — ACETAMINOPHEN-CAFFEINE 500-65 MG PO CAPS: 2.0000 | ORAL_CAPSULE | ORAL | 0 refills | Status: DC | PRN

## 2016-06-03 MED ORDER — RANITIDINE HCL 150 MG PO TABS: 150.0000 mg | ORAL_TABLET | Freq: Two times a day (BID) | ORAL | 12 refills | Status: DC | PRN

## 2016-06-03 NOTE — Patient Instructions (Addendum)
[name],  Gracias por venir hoy. Recomiendo probar Tylenol con tabletas de cafena, segn sea necesario para el dolor de cabeza severo. No tomes esto CarMaxtodos los das. Tambin es probable que aumente la ingesta de agua (8 vasos al da).  Te llamar con tus resultados de laboratorio.  Usted est por papanicolaou. Por favor, programe a su conveniencia.  Thank you for coming in today. I recommend trying tylenol with caffeine tablets as needed for severe headache. Do not take this everyday. Increasing your water intake will also likely help (8 glasses a day).   I will call you with your lab results.   You are due for pap smear. Please schedule at your convenience.  Best, Dr. Sampson GoonFitzgerald

## 2016-06-03 NOTE — Progress Notes (Signed)
[facility] Progress Note  Subjective:  Hannah Orozco is a 36 y.o. female who presents for headache and fatigue. Visited assisted by ArvinMeritorSpanish Video Interpreter Alexander [phone]).   #Headache: - Has 2-3 times a week and lasts 3-4 hours usually, occurs at front and sides of head and feels like a soreness - Motrin makes better - Tends to happen more in the afternoon - Associated with a little bit of nausea and sensitive to sounds - Denies new stressors - Pt's mother also used to get headaches - Sometimes dizzy and has noted low blood pressures - Does drink caffeine; only has 2-3 cups of fluid a day ROS: No vision changes, no vomiting  #Fatigue: - Worse over last 2 months - Sleeps 9-10 hours a night - Has history of anxiety but not on medication - Has 2 young children, one with frequent doctor's appointments ROS: No hair loss, no dry skin, no temperature intolerance  Social: never smoker  No Known Allergies  Objective: Blood pressure 108/62, pulse 78, temperature 98.4 F (36.9 C), temperature source Oral, height 4\' 11"  (1.499 m), weight 103 lb (46.7 kg) Body mass index is 20.8 kg/m. Constitutional: Well-appearing female in NAD Cardiovascular: RRR, S1, S2, no m/r/g.  Pulmonary/Chest: Effort normal and breath sounds normal. No respiratory distress.  Neurological: AOx3, no focal deficits. CNII-XII intact.  Psychiatric: Somewhat flat affect. Vitals reviewed  Depression screen [facility] 2/9 06/03/2016 06/03/2016 01/13/2016 12/03/2015 03/25/2015  Decreased Interest 2 0 0 0 0  Down, Depressed, Hopeless 2 0 0 0 0  PHQ - 2 Score 4 0 0 0 0  Altered sleeping 2 - - 0 -  Tired, decreased energy 2 - - 0 -  Change in appetite 0 - - 0 -  Feeling bad or failure about yourself  2 - - 0 -  Trouble concentrating 0 - - 0 -  Moving slowly or fidgety/restless 0 - - 0 -  Suicidal thoughts 0 - - 0 -  PHQ-9 Score 10 - - 0 -    Assessment/Plan: Headache - Consistent with tension  headaches - Recommended taking tylenol prn and prescribed tylenol with caffeine for more intense headaches. Also advised increasing water intake.  - Mood may also be contributing  Fatigue - New for last 2 months - Scored 10 on PHQ (moderate depression); not interested in medication at this time but would like more information on counseling. [facility] No SI/HI - Will check CBC, TSH, BMP to look for anemia, hypothyroidism, and electrolyte abnormalities  Follow-up if no improvement in headaches or would like to discuss mood further.  Dani GobbleHillary Taija Mathias, MD [facility], PGY-2

## 2016-06-03 NOTE — Progress Notes (Signed)
Integrated Behavioral Health Initial Visit  MRN: [id number] Name: Hannah Orozco  Type of Service: Integrated Behavioral Health- Individual/Family Interpretor:Yes.   Interpretor Name and Language: Spanish Revonda Standard( Allison # [phone]   Warm Hand Off Completed.      SUBJECTIVE: Hannah DionesMaria L Saltsman is a 36 y.o. female . Patient was referred by Dr. Sampson GoonFitzgerald for Spanish speaking counseling resources. Patient reports the following symptoms/concerns: feeling sad Duration of problem: 1 month; Severity of problem: moderate.  History or depression about 3 or 4 years ago.  Patient was on medication but no counseling.   OBJECTIVE: Mood: Euthymic and Affect: Appropriate but tearful at times Risk of harm to self or others: No plan to harm self or others  LIFE CONTEXT: Family and Social: lives at UnumProvidentmother's home with her husband, 2 minor children.  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  GOALS ADDRESSED: Patient will reduce symptoms of: feeling sad, depression and increase knowledge and ability of: coping skills and stress reduction also: Increase healthy adjustment to current life circumstances   INTERVENTIONS: Supportive Counseling and Link to [facility]Community Resources  Standardized Assessments completed: PHQ 9 Depression screen [facility] 2/9 06/03/2016 06/03/2016 01/13/2016  Decreased Interest 2 0 0  Down, Depressed, Hopeless 2 0 0  PHQ - 2 Score 4 0 0  Altered sleeping 2 - -  Tired, decreased energy 2 - -  Change in appetite 0 - -  Feeling bad or failure about yourself  2 - -  Trouble concentrating 0 - -  Moving slowly or fidgety/restless 0 - -  Suicidal thoughts 0 - -  PHQ-9 Score 10 - -   ASSESSMENT: Patient currently experiencing depression. Patient may benefit from and is in agreement to go to counseling for ongoing therapy. Provided patient with resources for Spanish speaking  therapist.  PLAN: 1. Behavioral recommendations: walk in appointment with Family Services 2. Referral(s): [facility]Community Mental Health Services (LME/Outside Clinic) and Community Resources:  [facility] 3. "From scale of 1-10, how likely are you to follow plan?": 10  Soundra PilonMoore, Ivory Bail H, LCSW Sammuel Hineseborah Allexus Ovens, LCSW Licensed Clinical Social Worker [facility]   [phone] 4:29 PM

## 2016-06-04 LAB — BASIC METABOLIC PANEL
BUN/Creatinine Ratio: 19 (ref 9–23)
BUN: 9 mg/dL (ref 6–20)
CHLORIDE: 101 mmol/L (ref 96–106)
CO2: 23 mmol/L (ref 18–29)
CREATININE: 0.48 mg/dL — AB (ref 0.57–1.00)
Calcium: 9.4 mg/dL (ref 8.7–10.2)
GFR calc non Af Amer: 127 mL/min/{1.73_m2} (ref >59)
GFR, EST AFRICAN AMERICAN: 146 mL/min/{1.73_m2} (ref >59)
GLUCOSE: 87 mg/dL (ref 65–99)
Potassium: 4.8 mmol/L (ref 3.5–5.2)
Sodium: 141 mmol/L (ref 134–144)

## 2016-06-04 LAB — CBC
Hematocrit: 35.5 % (ref 34.0–46.6)
Hemoglobin: 11.6 g/dL (ref 11.1–15.9)
MCH: 29.7 pg (ref 26.6–33.0)
MCHC: 32.7 g/dL (ref 31.5–35.7)
MCV: 91 fL (ref 79–97)
Platelets: 176 10*3/uL (ref 150–379)
RBC: 3.9 x10E6/uL (ref 3.77–5.28)
RDW: 14.2 % (ref 12.3–15.4)
WBC: 4.7 10*3/uL (ref 3.4–10.8)

## 2016-06-04 LAB — TSH: TSH: 3.46 u[IU]/mL (ref 0.450–4.500)

## 2016-06-05 DIAGNOSIS — R51 Headache: Secondary | ICD-10-CM

## 2016-06-05 DIAGNOSIS — R519 Headache, unspecified: Secondary | ICD-10-CM | POA: Insufficient documentation

## 2016-06-05 DIAGNOSIS — R5383 Other fatigue: Secondary | ICD-10-CM | POA: Insufficient documentation

## 2016-06-05 NOTE — Assessment & Plan Note (Addendum)
-   Consistent with tension headaches - Recommended taking tylenol prn and prescribed tylenol with caffeine for more intense headaches. Also advised increasing water intake.  - Mood may also be contributing

## 2016-06-06 NOTE — Assessment & Plan Note (Signed)
-   New for last 2 months - Scored 10 on PHQ (moderate depression); not interested in medication at this time but would like more information on counseling. [facility] No SI/HI - Will check CBC, TSH, BMP to look for anemia, hypothyroidism, and electrolyte abnormalities

## 2016-06-08 ENCOUNTER — Ambulatory Visit: Payer: Self-pay

## 2017-03-15 NOTE — L&D Delivery Note (Signed)
  Delivery Note At 3:31 AM a viable female was delivered via Vaginal, Spontaneous (Presentation LOA: ;  ).  APGAR: 8, 9; weight  Pending After 1 minute, the cord was clamped and cut. 40 units of pitocin diluted in 1000cc LR was infused rapidly IV.  The placenta separated spontaneously and delivered via CCT and maternal pushing effort.  It was inspected and appears to be intact with a 3 VC.  Marland Kitchen.   Anesthesia:  epidural Episiotomy: None Lacerations: None Suture Repair:  Est. Blood Loss (mL): 158  Mom to postpartum.  Baby to Couplet care / Skin to Skin.  Hannah Orozco 12/02/2017, 4:03 AM

## 2017-04-29 ENCOUNTER — Ambulatory Visit (INDEPENDENT_AMBULATORY_CARE_PROVIDER_SITE_OTHER): Payer: Self-pay | Admitting: Internal Medicine

## 2017-04-29 ENCOUNTER — Encounter: Payer: Self-pay | Admitting: Internal Medicine

## 2017-04-29 VITALS — BP 100/70 | HR 77 | Temp 98.1°F | Ht 59.0 in | Wt 97.0 lb

## 2017-04-29 DIAGNOSIS — N926 Irregular menstruation, unspecified: Secondary | ICD-10-CM

## 2017-04-29 DIAGNOSIS — Z3201 Encounter for pregnancy test, result positive: Secondary | ICD-10-CM

## 2017-04-29 DIAGNOSIS — Z3481 Encounter for supervision of other normal pregnancy, first trimester: Secondary | ICD-10-CM

## 2017-04-29 LAB — POCT URINE PREGNANCY: Preg Test, Ur: POSITIVE — AB

## 2017-04-29 MED ORDER — VITAMIN B-6 25 MG PO TABS: 25.0000 mg | ORAL_TABLET | Freq: Three times a day (TID) | ORAL | 0 refills | Status: DC | PRN

## 2017-04-29 MED ORDER — PRENATAL MULTIVITAMIN CH: 1.0000 | ORAL_TABLET | Freq: Every day | ORAL | 3 refills | Status: DC

## 2017-04-29 MED ORDER — DIPHENHYDRAMINE HCL (SLEEP) 25 MG PO TBDP: ORAL_TABLET | ORAL | 0 refills | Status: DC

## 2017-04-29 NOTE — Patient Instructions (Signed)
Hannah Orozco  Felicitaciones por tu Psychiatristembarazo.  Por favor llame a Leandra en [facility] para inscribirse con ellos. Una vez inscrito, llame a nuestra oficina para los laboratorios prenatales y la primera cita prenatal.  Para las nuseas, comience con vitamina B6 (piridoxina) 25 mg tres veces al C.H. Robinson Worldwideda. Si no hay mejora, pruebe con unisom 12.5 mg cada noche.  Comience a tomar una vitamina prenatal diaria.  Intenta aumentar la ingesta de agua y la actividad fsica.  Mejor, Dr. Sampson GoonFitzgerald  Hannah Orozco,  Congratulations on your pregnancy.  Please call Leandra at [employer] to enroll with them. Once enrolled, please call our office for prenatal labs and first prenatal appointment.  For nausea, start with vitamin B6 (pyridoxine) 25 mg three times a day. If no improvement, try unisom 12.5 mg nightly.   Start taking a daily prenatal vitamin.  Try to increase water intake and physical activity.  Best, Dr. Sampson GoonFitzgerald

## 2017-04-30 ENCOUNTER — Encounter: Payer: Self-pay | Admitting: Internal Medicine

## 2017-04-30 DIAGNOSIS — N926 Irregular menstruation, unspecified: Secondary | ICD-10-CM | POA: Insufficient documentation

## 2017-04-30 NOTE — Progress Notes (Signed)
[facility] Progress Note  Subjective:  Hannah Orozco is a 37 y.o. female who presents for general check-up.   Concerns: - Last LMP 03/11/17 and home pregnancy test that was positive.   #Missed period: - Not taking prenatal vitamin. - Having a lot of nausea that is affecting her appetite but does not think she has lost weight and is not having vomiting.  Social: Says things are much better at home and stress levels improved since husband has significantly cut down on his drinking. He has never hurt her and she feels safe at home. - Never smoker. Does not drink. Does not smoke.  - Not regularly exercising but chases 2 young children around at home.   ROS: Negative aside from nausea. No dizziness or headaches.    No Known Allergies  Social History   Tobacco Use  . Smoking status: Never Smoker  . Smokeless tobacco: Never Used  Substance Use Topics  . Alcohol use: No    Objective: Blood pressure 100/70, pulse 77, temperature 98.1 F (36.7 C), temperature source Oral, height 4\' 11"  (1.499 m), weight 97 lb (44 kg), SpO2 100 %, unknown if currently breastfeeding. Body mass index is 19.59 kg/m. Constitutional: Well appearing female in NAD HENT: MMM Cardiovascular: RRR, S1, S2, no m/r/g.  Pulmonary/Chest: Effort normal and breath sounds normal.  Abdominal: Soft. +BS, NT Musculoskeletal: No LE edema Neurological: AOx3, no focal deficits. Patellar reflexes 1+ bilaterally.  Skin: Skin is warm and dry. No rash noted.  Psychiatric: Normal mood and affect.  Vitals reviewed  PHQ9: Score of 2 (1 point for feeling tired and 1 point for having little energy).   Assessment/Plan: Missed menses - Upreg positive today. Based on LMP (patient says periods are pretty regular), EDD would be around 12/16/2017, and patient is about 7w pregnant.  - Recommended patient enroll in [facility] program as she has with other pregnancies. [facility] -  Recommended starting prenatal vitamin.  - For nausea, to try vitamin B6 25 mg three times a day. If no improvement, to add unisom 12.5 mg nightly.   Follow-up at earliest convenience once enrolled in [employer] for prenatal labs and pap smear and initial prenatal visit.  Dani GobbleHillary Mikaiya Tramble, MD [facility], PGY-3

## 2017-04-30 NOTE — Assessment & Plan Note (Signed)
-   Upreg positive today. Based on LMP (patient says periods are pretty regular), EDD would be around 12/16/2017, and patient is about 7w pregnant.  - Recommended patient enroll in [facility] program as she has with other pregnancies. [facility]. - Recommended starting prenatal vitamin.  - For nausea, to try vitamin B6 25 mg three times a day. If no improvement, to add unisom 12.5 mg nightly.

## 2017-05-03 ENCOUNTER — Other Ambulatory Visit (INDEPENDENT_AMBULATORY_CARE_PROVIDER_SITE_OTHER): Payer: Self-pay

## 2017-05-03 DIAGNOSIS — Z3491 Encounter for supervision of normal pregnancy, unspecified, first trimester: Secondary | ICD-10-CM

## 2017-05-03 LAB — POCT URINALYSIS DIP (MANUAL ENTRY)
BILIRUBIN UA: NEGATIVE mg/dL
Bilirubin, UA: NEGATIVE
Blood, UA: NEGATIVE
Glucose, UA: NEGATIVE mg/dL
LEUKOCYTES UA: NEGATIVE
Nitrite, UA: POSITIVE — AB
PH UA: 6.5 (ref 5.0–8.0)
PROTEIN UA: NEGATIVE mg/dL
Spec Grav, UA: 1.025 (ref 1.010–1.025)
UROBILINOGEN UA: 0.2 U/dL

## 2017-05-03 LAB — POCT UA - MICROSCOPIC ONLY

## 2017-05-03 NOTE — Progress Notes (Signed)
ob

## 2017-05-04 LAB — OBSTETRIC PANEL, INCLUDING HIV
Antibody Screen: NEGATIVE
BASOS ABS: 0 10*3/uL (ref 0.0–0.2)
Basos: 0 %
EOS (ABSOLUTE): 0.1 10*3/uL (ref 0.0–0.4)
Eos: 1 %
HIV Screen 4th Generation wRfx: NONREACTIVE
Hematocrit: 34.1 % (ref 34.0–46.6)
Hemoglobin: 11.2 g/dL (ref 11.1–15.9)
Hepatitis B Surface Ag: NEGATIVE
IMMATURE GRANULOCYTES: 0 %
Immature Grans (Abs): 0 10*3/uL (ref 0.0–0.1)
LYMPHS ABS: 1.3 10*3/uL (ref 0.7–3.1)
Lymphs: 22 %
MCH: 30.6 pg (ref 26.6–33.0)
MCHC: 32.8 g/dL (ref 31.5–35.7)
MCV: 93 fL (ref 79–97)
Monocytes Absolute: 0.5 10*3/uL (ref 0.1–0.9)
Monocytes: 8 %
Neutrophils Absolute: 4.1 10*3/uL (ref 1.4–7.0)
Neutrophils: 69 %
Platelets: 198 10*3/uL (ref 150–379)
RBC: 3.66 x10E6/uL — ABNORMAL LOW (ref 3.77–5.28)
RDW: 14.6 % (ref 12.3–15.4)
RPR Ser Ql: NONREACTIVE
Rh Factor: POSITIVE
Rubella Antibodies, IGG: 6.03 index (ref >0.99)
WBC: 6 10*3/uL (ref 3.4–10.8)

## 2017-05-06 ENCOUNTER — Other Ambulatory Visit: Payer: Self-pay

## 2017-05-06 ENCOUNTER — Inpatient Hospital Stay (HOSPITAL_COMMUNITY)
Admission: AD | Admit: 2017-05-06 | Discharge: 2017-05-06 | Disposition: A | Discharge status: Home / Self Care | Payer: Self-pay | Source: Ambulatory Visit | Attending: Obstetrics & Gynecology | Admitting: Obstetrics & Gynecology

## 2017-05-06 ENCOUNTER — Inpatient Hospital Stay (HOSPITAL_COMMUNITY): Payer: Self-pay

## 2017-05-06 DIAGNOSIS — O208 Other hemorrhage in early pregnancy: Secondary | ICD-10-CM | POA: Insufficient documentation

## 2017-05-06 DIAGNOSIS — O26891 Other specified pregnancy related conditions, first trimester: Secondary | ICD-10-CM

## 2017-05-06 DIAGNOSIS — Z3A08 8 weeks gestation of pregnancy: Secondary | ICD-10-CM | POA: Insufficient documentation

## 2017-05-06 DIAGNOSIS — M545 Low back pain: Secondary | ICD-10-CM | POA: Insufficient documentation

## 2017-05-06 DIAGNOSIS — O26899 Other specified pregnancy related conditions, unspecified trimester: Secondary | ICD-10-CM

## 2017-05-06 DIAGNOSIS — O468X1 Other antepartum hemorrhage, first trimester: Secondary | ICD-10-CM

## 2017-05-06 DIAGNOSIS — R109 Unspecified abdominal pain: Secondary | ICD-10-CM | POA: Insufficient documentation

## 2017-05-06 DIAGNOSIS — N898 Other specified noninflammatory disorders of vagina: Secondary | ICD-10-CM

## 2017-05-06 DIAGNOSIS — O26892 Other specified pregnancy related conditions, second trimester: Secondary | ICD-10-CM | POA: Insufficient documentation

## 2017-05-06 DIAGNOSIS — O418X10 Other specified disorders of amniotic fluid and membranes, first trimester, not applicable or unspecified: Secondary | ICD-10-CM

## 2017-05-06 DIAGNOSIS — Z3491 Encounter for supervision of normal pregnancy, unspecified, first trimester: Secondary | ICD-10-CM

## 2017-05-06 HISTORY — DX: Major depressive disorder, single episode, unspecified: F32.9

## 2017-05-06 HISTORY — DX: Depression, unspecified: F32.A

## 2017-05-06 LAB — URINE CULTURE, OB REFLEX

## 2017-05-06 LAB — URINALYSIS, ROUTINE W REFLEX MICROSCOPIC
BILIRUBIN URINE: NEGATIVE
Glucose, UA: NEGATIVE mg/dL
Hgb urine dipstick: NEGATIVE
Ketones, ur: NEGATIVE mg/dL
Leukocytes, UA: NEGATIVE
Nitrite: NEGATIVE
Protein, ur: NEGATIVE mg/dL
SPECIFIC GRAVITY, URINE: 1.011 (ref 1.005–1.030)
pH: 9 — ABNORMAL HIGH (ref 5.0–8.0)

## 2017-05-06 LAB — CBC
HCT: 31.4 % — ABNORMAL LOW (ref 36.0–46.0)
Hemoglobin: 10.8 g/dL — ABNORMAL LOW (ref 12.0–15.0)
MCH: 31.5 pg (ref 26.0–34.0)
MCHC: 34.4 g/dL (ref 30.0–36.0)
MCV: 91.5 fL (ref 78.0–100.0)
Platelets: 165 10*3/uL (ref 150–400)
RBC: 3.43 MIL/uL — AB (ref 3.87–5.11)
RDW: 14.3 % (ref 11.5–15.5)
WBC: 4.8 10*3/uL (ref 4.0–10.5)

## 2017-05-06 LAB — WET PREP, GENITAL
Clue Cells Wet Prep HPF POC: NONE SEEN
Sperm: NONE SEEN
Trich, Wet Prep: NONE SEEN
Yeast Wet Prep HPF POC: NONE SEEN

## 2017-05-06 LAB — CULTURE, OB URINE

## 2017-05-06 LAB — HCG, QUANTITATIVE, PREGNANCY: HCG, BETA CHAIN, QUANT, S: 187812 m[IU]/mL — AB (ref <5)

## 2017-05-06 NOTE — MAU Provider Note (Signed)
History     CSN: [id number]  Arrival date and time: 05/06/17 1006   First Provider Initiated Contact with Patient 05/06/17 1131      Chief Complaint  Patient presents with  . Vaginal Bleeding  . Back Pain   HPI Hannah Orozco is a 37 y.o. G3P2002 at [redacted]w[redacted]d who presents with brown discharge and lower back pain. She states the pain has been going on for 2 days and she rates it a 3/10. She has not tried anything for the pain. She states this morning she woke up and had brown discharge after urinating. Denies any pain or burning with urination.   OB History    Gravida Para Term Preterm AB Living   3 2 2     2    SAB TAB Ectopic Multiple Live Births         0 2      Past Medical History:  Diagnosis Date  . Anemia   . Anxiety    no meds, doing ok  . Depression    was on antidepressants ~71yrs ago (2013), fine now  . GERD (gastroesophageal reflux disease)   . Thrombocytopenia (HCC)     Past Surgical History:  Procedure Laterality Date  . ESOPHAGUS SURGERY  ~2004   laparoscopic, ? opened up esophagus so she could eat/swallow    Family History  Problem Relation Age of Onset  . Hypertension Mother   . Diabetes Mother   . Hearing loss Neg Hx   . Asthma Neg Hx   . Cancer Neg Hx   . Heart disease Neg Hx   . Stroke Neg Hx     Social History   Tobacco Use  . Smoking status: Never Smoker  . Smokeless tobacco: Never Used  Substance Use Topics  . Alcohol use: No  . Drug use: No    Allergies: No Known Allergies  Medications Prior to Admission  Medication Sig Dispense Refill Last Dose  . Prenatal Vit-Fe Fumarate-FA (PRENATAL MULTIVITAMIN) TABS tablet Take 1 tablet by mouth daily. 30 tablet 3 05/05/2017 at Unknown time  . vitamin B-6 (PYRIDOXINE) 25 MG tablet Take 1 tablet (25 mg total) by mouth 3 (three) times daily as needed. 90 tablet 0 05/05/2017 at Unknown time  . DiphenhydrAMINE HCl, Sleep, 25 MG TBDP Before bed if having nausea. (Patient not taking: Reported on  05/06/2017) 28 each 0 Not Taking at Unknown time  . ranitidine (ZANTAC) 150 MG tablet Take 1 tablet (150 mg total) by mouth 2 (two) times daily as needed for heartburn. (Patient not taking: Reported on 05/06/2017) 60 tablet 12 Not Taking at Unknown time    Review of Systems  Constitutional: Negative.  Negative for fatigue and fever.  HENT: Negative.   Respiratory: Negative.  Negative for shortness of breath.   Cardiovascular: Negative.  Negative for chest pain.  Gastrointestinal: Positive for abdominal pain. Negative for constipation, diarrhea, nausea and vomiting.  Genitourinary: Positive for vaginal discharge. Negative for dysuria.  Neurological: Negative.  Negative for dizziness and headaches.   Physical Exam   Blood pressure 100/62, pulse 71, temperature 98.5 F (36.9 C), temperature source Oral, resp. rate 16, weight 98 lb 2 oz (44.5 kg), last menstrual period 03/11/2017, SpO2 100 %, unknown if currently breastfeeding.  Physical Exam  Nursing note and vitals reviewed. Constitutional: She is oriented to person, place, and time. She appears well-developed and well-nourished. No distress.  HENT:  Head: Normocephalic.  Eyes: Pupils are equal, round, and reactive to  light.  Cardiovascular: Normal rate, regular rhythm and normal heart sounds.  Respiratory: Effort normal and breath sounds normal. No respiratory distress.  GI: Soft. Bowel sounds are normal. She exhibits no distension. There is no tenderness.  Genitourinary:  Genitourinary Comments: Small amount of brown mucous discharge noted. Cervix closed. No tenderness to cervix or adnexa.  Neurological: She is alert and oriented to person, place, and time.  Skin: Skin is warm and dry.  Psychiatric: She has a normal mood and affect. Her behavior is normal. Judgment and thought content normal.    MAU Course  Procedures Results for orders placed or performed during the hospital encounter of 05/06/17 (from the past 24 hour(s))   Urinalysis, Routine w reflex microscopic     Status: Abnormal   Collection Time: 05/06/17 10:28 AM  Result Value Ref Range   Color, Urine YELLOW YELLOW   APPearance CLEAR CLEAR   Specific Gravity, Urine 1.011 1.005 - 1.030   pH 9.0 (H) 5.0 - 8.0   Glucose, UA NEGATIVE NEGATIVE mg/dL   Hgb urine dipstick NEGATIVE NEGATIVE   Bilirubin Urine NEGATIVE NEGATIVE   Ketones, ur NEGATIVE NEGATIVE mg/dL   Protein, ur NEGATIVE NEGATIVE mg/dL   Nitrite NEGATIVE NEGATIVE   Leukocytes, UA NEGATIVE NEGATIVE  CBC     Status: Abnormal   Collection Time: 05/06/17 10:51 AM  Result Value Ref Range   WBC 4.8 4.0 - 10.5 K/uL   RBC 3.43 (L) 3.87 - 5.11 MIL/uL   Hemoglobin 10.8 (L) 12.0 - 15.0 g/dL   HCT 16.131.4 (L) 09.636.0 - 04.546.0 %   MCV 91.5 78.0 - 100.0 fL   MCH 31.5 26.0 - 34.0 pg   MCHC 34.4 30.0 - 36.0 g/dL   RDW 40.914.3 81.111.5 - 91.415.5 %   Platelets 165 150 - 400 K/uL  hCG, quantitative, pregnancy     Status: Abnormal   Collection Time: 05/06/17 10:51 AM  Result Value Ref Range   hCG, Beta Chain, Quant, S [id number] (H) <5 mIU/mL  Wet prep, genital     Status: Abnormal   Collection Time: 05/06/17 11:36 AM  Result Value Ref Range   Yeast Wet Prep HPF POC NONE SEEN NONE SEEN   Trich, Wet Prep NONE SEEN NONE SEEN   Clue Cells Wet Prep HPF POC NONE SEEN NONE SEEN   WBC, Wet Prep HPF POC MANY (A) NONE SEEN   Sperm NONE SEEN    [place] Ob Less Than 14 Weeks With Ob Transvaginal  Result Date: 05/06/2017 CLINICAL DATA:  Pregnant, abdominal pain EXAM: OBSTETRIC <14 WK US AND TRANSVAGINAL OB US TECHNIQUE: Both transabdominal and transvaginal ultrasound examinations were performed for complete evaluation of the gestation as well as the maternal uterus, adnexal regions, and pelvic cul-de-sac. Transvaginal technique was performed to assess early pregnancy. COMPARISON:  None. FINDINGS: Intrauterine gestational sac: Single Yolk sac:  Not Visualized. Embryo:  Visualized. Cardiac Activity: Visualized. Heart Rate: 170 bpm CRL:   18.3 mm   8 w   2 d                  US EDC: 12/14/2017 Subchorionic hemorrhage:  Small subchronic hemorrhage. Maternal uterus/adnexae: Bilateral ovaries are within normal limits, noting a left probable corpus luteal cyst. No free fluid. IMPRESSION: Single live intrauterine gestation, with estimated gestational age [redacted] weeks 2 days by crown-rump length. Electronically Signed   By: Charline BillsSriyesh  Krishnan M.D.   On: 05/06/2017 12:50    MDM UA, UPT CBC, HCG, ABO/Rh Wet prep  and gc/chlamydia US OB Transvaginal US OB Comp Less 14 weeks Assessment and Plan   1. Normal intrauterine pregnancy on prenatal ultrasound in first trimester   2. Abdominal pain affecting pregnancy   3. [redacted] weeks gestation of pregnancy   4. Vaginal discharge during pregnancy in first trimester   5. Subchorionic hemorrhage of placenta in first trimester, single or unspecified fetus    -Discharge home in stable condition -Vaginal bleeding precautions discussed -Patient advised to follow-up with OB/GYN provider of choice to start prenatal care -Patient may return to MAU as needed or if her condition were to change or worsen  Rolm Bookbinder CNM 05/06/2017, 11:32 AM

## 2017-05-06 NOTE — MAU Note (Signed)
This morning when she went to pee, she noted ? Blood, brown like coffee.  No prior bleeding. Mild back pain, constant- not a new problem

## 2017-05-06 NOTE — Discharge Instructions (Signed)
[facility]  ° ° °[facility]       Phone: [phone] ° °[facility] Phone: [phone] ° °[facility] [place]  Phone: [phone] ° °[facility] [place]  Phone: [phone] ° °[facility] [place]  Phone: [phone] ° °[facility]       Phone: [phone] ° °[facility]    Phone: [phone]  ° °[facility] ([place]    Phone: [phone] ° °[facility]    Phone: [phone] ° °[facility]    Phone: [phone] ° °[facility]    Phone: [phone] ° °[facility]       Phone: [phone]  ° °[facility]  Phone: [phone] ° °[facility]    Phone: [phone] ° °[facility]   Phone: [phone] ° °[facility]      Phone: [phone] ° °[facility]    Phone: [phone] ° °Las medicinas seguras para tomar durante el embarazo  °Safe Medications in Pregnancy  °Acné:  °Benzoyl Peroxide (Peróxido de benzoílo)  °Salicylic Acid (Ácido salicílico)  °Dolor de espalda/Dolor de cabeza:  °Tylenol: 2 pastillas de concentración regular cada 4 horas O 2 pastillas de concentración fuerte cada 6 horas  °Resfriados/Tos/Alergias:  °Benadryl (sin alcohol) 25 mg cada 6 horas según lo necesite °Breath Right strips (Tiras para respirar correctamente)  °Claritin  °Cepacol (pastillas de chupar para la garganta)  °Chloraseptic (aerosol para la garganta)  °Cold-Eeze- hasta tres veces por día  °Cough drops (pastillas de chupar para la tos, sin alcohol)  °Flonase (con receta médica solamente)  °Guaifenesin  °Mucinex  °Robitussin DM (simple solamente, sin alcohol)  °Saline nasal spray/drops (Aerosol nasal  salino/gotas) °Sudafed (pseudoephedrine) y  °Actifed * utilizar sólo después de 12 semanas de gestación y si no tiene la presión arterial alta.  °Tylenol Vicks  °VapoRub  °Zinc lozenges (pastillas para la garganta)  °Zyrtec  °Estreñimiento:  °Colace  °Ducolax (supositorios)  °Fleet enema (lavado intestinal rectal)  °Glycerin (supositorios)  °Metamucil  °Milk of magnesia (leche de magnesia) ° Miralax  °Senokot  °Smooth Move (té)  °Diarrea:  °Kaopectate °Imodium A-D  °*NO tome Pepto-Bismol  °Hemorroides:  °Anusol  °Anusol HC  °Preparation H  °Tucks  °Indigestión:  °Tums ° Maalox  °Mylanta  °Zantac  °Pepcid  °Insomnia:  °Benadryl (sin alcohol) 25mg cada 6 horas según lo necesite  °Tylenol PM  °Unisom, no Gelcaps  °Calambres en las piernas:  °Tums  °MagGel °Náuseas/Vómitos:  °Bonine  °Dramamine  °Emetrol  °Ginger (extracto)  °Sea-Bands  °Meclizine  °Medicina para las náuseas que puede tomar durante el embarazo: Unisom (doxylamine succinate, pastillas de 25 mg) Tome una pastilla al día al acostarse. Si los síntomas no están adecuadamente controlados, la dosis puede aumentarse hasta una dosis máxima recomendada de dos pastillas al día (1/2 pastilla por la mañana, 1/2 pastilla a media tarde y una pastilla al acostarse). Pastillas de Vitamina B6 de 100mg. Tome una pastilla dos veces al día (hasta 200 mg por día).  °Erupciones en la piel:  °Productos de Aveeno  °Benadryl cream (crema o una dosis de 25mg cada 6 horas según lo necesite)  °Calamine Lotion (loción)  °1% cortisone cream (crema de cortisona de 1%)  °nfección vaginal por hongos (candidiasis):  °Gyne-lotrimin 7  °Monistat 7  ° °**Si está   tomando Golden West Financialvarias medicinas, por favor revise las etiquetas para evitar Northrop Grummanduplicar los mismos ingredientes Sardisactivos. **Tome la medicina segn lo indicado en la etiqueta. **No tome ms de 400 mg de Tylenol en 24 horas. **No tome medicinas que contengan aspirina o ibuprofeno.      Hematoma subcorinico (Subchorionic Hematoma) Un  hematoma subcorinico es una acumulacin de sangre entre la pared externa de la placenta y la pared interna del la matriz (tero). La placenta es el rgano que conecta el feto a la pared del tero. La placenta realiza la funcin de alimentacin, respiracin (oxgeno al feto) y el trabajo de eliminacin de desechos (excrecin) del feto. Un hematoma subcorinico es la anormalidad ms frecuente encontrada en una ecografa durante el primer trimestre o principios del segundo trimestre del embarazo. Si ha habido poca o ninguna hemorragia vaginal, generalmente los pequeos hematomas se reducen por su propia cuenta y no afectan al beb ni al Hannah Muldersembarazo. La sangre es absorbida gradualmente durante una o Hannah Orozco semanas. Cuando la hemorragia comienza ms tarde en el embarazo o el hematoma es ms grande o se produce en una paciente de edad avanzada, el resultado puede no ser tan bueno. Los grandes hematomas pueden agrandarse an ms y Hannah Orozco las posibilidades de aborto espontneo. El hematoma subcorinico tambin aumenta el riesgo de desprendimiento precoz de la placenta del tero, muerte fetal y Sport and exercise psychologistparto prematuro. INSTRUCCIONES PARA EL CUIDADO EN EL HOGAR  Repose en cama si el mdico se lo recomienda. Aunque el reposo en cama no evitar la hemorragia o un aborto espontneo, su mdico puede recomendarlo.  Evite levantar objetos pesados (ms de 10 libras [4,5 kg]), hacer ejercicio, tener relaciones sexuales o realizar duchas vaginales segn se lo indique el profesional.  Lleve un registro de la cantidad y [name] de remojo (saturacin) de las toallas higinicas que [name] cada da. Anote esta informacin.  No use tampones.  Cumpla con todas las visitas de control, segn le indique su mdico. El profesional podr pedirle que se realice anlisis de seguimiento, pruebas de [name] o [name].  SOLICITE ATENCIN MDICA DE INMEDIATO SI:  Siente calambres intensos en el estmago, en la espalda, en el abdomen o en la  pelvis.  Tiene fiebre.  Elimina cogulos o tejidos grandes. Guarde los tejidos para que su mdico los vea.  Si la hemorragia aumenta o siente mareos, debilidad o tiene episodios de Rudddesmayos.  Esta informacin no tiene Theme park managercomo fin reemplazar el consejo del mdico. Asegrese de hacerle al mdico cualquier pregunta que tenga. Document Released: 06/17/2008 Document Revised: 12/20/2012 Document Reviewed: 09/28/2012 Elsevier Interactive Patient Education  2017 [employer]

## 2017-05-07 ENCOUNTER — Encounter: Payer: Self-pay | Admitting: Family Medicine

## 2017-05-07 ENCOUNTER — Telehealth: Payer: Self-pay | Admitting: Internal Medicine

## 2017-05-07 ENCOUNTER — Other Ambulatory Visit: Payer: Self-pay | Admitting: Family Medicine

## 2017-05-07 MED ORDER — CEPHALEXIN 500 MG PO CAPS: 500.0000 mg | ORAL_CAPSULE | Freq: Two times a day (BID) | ORAL | 0 refills | Status: DC

## 2017-05-07 NOTE — Telephone Encounter (Signed)
Spanish Interpreter: # [id number]  Informed patient of OB culture and Keflex prescription sent by Dr. Jennette KettleNeal. Patient understands and questions answers.

## 2017-05-07 NOTE — Progress Notes (Signed)
Urine cx positive. Treating w 7 days cephalexin. Will need test of cure by OB provider at her follow up after treatment complete.MAU visit reviewed.

## 2017-05-07 NOTE — Progress Notes (Signed)
OB Urine cx resulted yesterday evening at 3:30. I saw resuts this AM.Will treat w cephalexin 500 bid for 7 days and will need test of cure ua and cx after tx.Discussed with Dr. Pollie MeyerMcIntyre.  I will send in rx and have inpatient team contact her as I am no on service or on call today. Rx sent. Dr. Ottie GlazierGunadasa  (On call today) will contact her. Denny LevySara Neal

## 2017-05-09 LAB — GC/CHLAMYDIA PROBE AMP (~~LOC~~) NOT AT ARMC
CHLAMYDIA, DNA PROBE: NEGATIVE
NEISSERIA GONORRHEA: NEGATIVE

## 2017-05-19 ENCOUNTER — Other Ambulatory Visit: Payer: Self-pay

## 2017-05-19 ENCOUNTER — Ambulatory Visit (INDEPENDENT_AMBULATORY_CARE_PROVIDER_SITE_OTHER): Payer: Self-pay | Admitting: Family Medicine

## 2017-05-19 ENCOUNTER — Encounter: Payer: Self-pay | Admitting: Family Medicine

## 2017-05-19 VITALS — BP 100/58 | HR 73 | Temp 98.0°F | Wt 100.0 lb

## 2017-05-19 DIAGNOSIS — K219 Gastro-esophageal reflux disease without esophagitis: Secondary | ICD-10-CM

## 2017-05-19 DIAGNOSIS — Z3A09 9 weeks gestation of pregnancy: Secondary | ICD-10-CM

## 2017-05-19 DIAGNOSIS — O219 Vomiting of pregnancy, unspecified: Secondary | ICD-10-CM

## 2017-05-19 LAB — POCT 1 HR PRENATAL GLUCOSE: Glucose 1 Hr Prenatal, POC: 141 mg/dL

## 2017-05-19 MED ORDER — RANITIDINE HCL 150 MG PO TABS: 150.0000 mg | ORAL_TABLET | Freq: Two times a day (BID) | ORAL | 12 refills | Status: DC | PRN

## 2017-05-19 MED ORDER — ONDANSETRON HCL 4 MG PO TABS: 4.0000 mg | ORAL_TABLET | Freq: Three times a day (TID) | ORAL | 2 refills | Status: DC | PRN

## 2017-05-19 NOTE — Patient Instructions (Addendum)
Es Curator! Lo veremos en 4 semanas para su prxima cita. Si tiene algn sangrado entre ahora y [place], vaya al [facility] para una evaluacin.  [name], DO PGY-2, [facility] 05/19/2017 2:18 PM   Primer trimestre de Hannah Orozco First Trimester of Pregnancy El primer trimestre de embarazo se extiende desde la semana1 hasta el final de la semana13 (mes1 al mes3). Una semana despus de que un espermatozoide fecunda un vulo, este se implantar en la pared uterina. Este embrin comenzar a Camera operator convertirse en un beb. Sus genes y los de su pareja forman el beb. Los genes del varn determinan si ser un nio o una nia. Entre la semana6 y [place], se forman los ojos y Recruitment consultant, y los latidos del corazn pueden verse en una ecografa. Al final de las 12semanas, todos los rganos del beb estn formados. Ahora que est embarazada, querr hacer todo lo que est a su alcance para tener un beb sano. Dos de las cosas ms importantes son Wilburt Finlay un buen cuidado prenatal y seguir las indicaciones del mdico. El cuidado prenatal incluye toda la asistencia mdica que usted recibe antes del nacimiento del beb. Esto ayudar a Radio producer, Engineer, manufacturing y tratar cualquier problema durante el embarazo y [place]. Durante Financial risk analyst trimestre, ocurren cambios en el cuerpo. Su organismo atraviesa por muchos cambios durante el [place]. Estos cambios varan de [place] a [place].  Al principio, puede aumentar o bajar algunos kilos.  Puede tener Programme researcher, broadcasting/film/video (nuseas) y vomitar. Si no puede controlar los vmitos, llame al mdico.  Puede cansarse con facilidad.  Es posible que tenga dolores de cabeza que pueden aliviarse con ciertos medicamentos. Todos los United Parcel que tome deben estar aprobados por el mdico.  Puede orinar con mayor frecuencia. El dolor al orinar puede significar que usted tiene una infeccin de la vejiga.  Debido al Hannah Orozco, puede tener  acidez estomacal.  Puede estar estreida, ya que ciertas hormonas enlentecen los movimientos de los msculos que empujan las heces a travs del intestino.  Pueden aparecer hemorroides o hincharse las venas (venas varicosas).  Las ConAgra Foods pueden empezar a Government social research officer y Emergency planning/management officer. Los pezones pueden sobresalir ms, y el tejido que los rodea (arola) tornarse ms oscuro.  Las Veterinary surgeon y estar sensibles al cepillado y al hilo dental.  Pueden aparecer zonas oscuras o manchas (cloasma, mscara del [place]) en el rostro. Esto probablemente se atenuar despus del nacimiento del beb.  Los perodos menstruales se interrumpirn.  Tal vez no tenga apetito.  Puede sentir un fuerte deseo de consumir ciertos alimentos.  Puede tener cambios a Theatre manager a da, por ejemplo, por momentos puede estar emocionada por el Psychiatrist y por otros preocuparse porque algo pueda salir mal con el embarazo o el beb.  Tendr sueos ms vvidos y extraos.  Tal vez haya cambios en el cabello. Esto cambios pueden incluir su engrosamiento, crecimiento rpido y Allied Waste Industries textura. Adems, a algunas mujeres se les cae el cabello durante o despus del embarazo, o tienen el cabello seco o fino. Lo ms probable es que el cabello se le normalice despus del nacimiento del beb.  Qu debe esperar en las visitas prenatales Durante una visita prenatal de rutina:  La pesarn para asegurarse de que usted y el beb estn creciendo normalmente.  Le tomarn la presin arterial.  Le medirn el abdomen para controlar el desarrollo del beb.  Se escucharn los latidos cardacos fetales entre las semanas10  y14 de embarazo.  Se analizarn los resultados de los estudios solicitados en visitas anteriores.  El mdico puede preguntarle lo siguiente:  Cmo se siente.  Si siente los movimientos del beb.  Si ha tenido sntomas anormales, como prdida de lquido, [place], dolores de cabeza intensos o  clicos abdominales.  Si est consumiendo algn producto que contenga tabaco, como cigarrillos, tabaco de Theatre manager y Administrator, Civil Service.  Si tiene [employer]gate-Palmolive.  Otros estudios que pueden realizarse durante el primer trimestre incluyen lo siguiente:  Anlisis de sangre para determinar el grupo sanguneo y Engineer, manufacturing la presencia de infecciones previas. Las pruebas tambin se utilizarn para Chief Strategy Officer si tiene bajo nivel de hierro (anemia) y protenas en los glbulos rojos (anticuerpos Rh). En funcin de sus factores de riesgo, o si ya tuvo diabetes durante un embarazo anterior, le pueden hacer pruebas para determinar si tiene un nivel alto de azcar en la sangre, algo que puede afectar a embarazadas (diabetes gestacional).  Anlisis de orina para detectar infecciones, diabetes o protenas en la orina.  Una ecografa para confirmar que el beb crece y se desarrolla correctamente.  Estudios fetales para Engineer, manufacturing problemas de la mdula espinal (espina bfida) y sndrome de [place].  Prueba del VIH (virus de inmunodeficiencia humana). Los exmenes prenatales de rutina incluyen la prueba de deteccin del VIH, a menos que decida no Futures trader.  Es posible que necesite otras pruebas adicionales.  Siga estas instrucciones en su casa: Medicamentos  Siga las indicaciones del mdico en relacin con el uso de medicamentos. Durante el embarazo, hay medicamentos que pueden tomarse y otros que no.  Tome vitaminas prenatales que contengan por lo menos (?g) de cido flico.  Si est estreida, tome un laxante suave, si el mdico lo autoriza. Comida y bebida  Esperanza Richters dieta equilibrada que incluya gran cantidad de frutas y verduras frescas, cereales integrales, buenas fuentes de protenas como carnes [place], huevos o tofu, y lcteos descremados. El mdico la ayudar a Production assistant, radio cantidad de peso que puede [place].  No coma carne cruda ni quesos sin cocinar. Estos elementos  contienen grmenes que pueden causar defectos congnitos en el beb.  La ingesta diaria de cuatro o cinco comidas pequeas en lugar de tres comidas abundantes puede ayudar a Yahoo nuseas y los vmitos. Si empieza a tener nuseas, comer algunas [address] puede ser de [place]. Beber lquidos Altria Group, Teacher, English as a foreign language de tomarlos durante las comidas, tambin puede ayudar a Paramedic las nuseas y los vmitos.  Limite el consumo de alimentos con alto contenido de grasas y azcares procesados, como alimentos fritos o dulces.  Para evitar el estreimiento: ? Consuma alimentos ricos en fibra, como frutas y verduras frescas, cereales integrales y frijoles. ? Beba suficiente lquido para mantener la orina clara o de color amarillo plido. Actividad  Haga ejercicio solamente como se lo haya indicado el mdico. La mayora de las mujeres pueden continuar su rutina de ejercicios durante el [place]. Intente realizar como mnimo de actividad fsica por lo menos 5das a la semana. El ejercicio la ayudar a: ? Engineering geologist. ? Mantenerse en forma. ? Estar preparada para el trabajo de parto y [place].  Los dolores, los clicos en la parte baja del abdomen o los calambres en la cintura son un buen indicio de que debe dejar de Corporate treasurer. Consulte al mdico antes de seguir haciendo ejercicios con normalidad.  Intente no estar de pie FedEx. Mueva las piernas con frecuencia si debe  estar de pie en un lugar durante mucho tiempo.  Evite levantar pesos Fortune Brandsexcesivos.  Use zapatos de tacones bajos y [place]mantenga una buena postura.  Puede seguir teniendo The St. Paul Travelersrelaciones sexuales, salvo que el mdico le indique lo contrario. Alivio del dolor y del Dentistmalestar  Use un sostn que le brinde buen soporte para Engineer, materialsaliviar el dolor de [place].  Dese baos de asiento con agua tibia para Engineer, materialsaliviar el dolor o las molestias causadas por las hemorroides. Use una crema para las hemorroides si el  mdico la autoriza.  Descanse con las piernas elevadas si tiene calambres o dolor de cintura.  Si tiene venas varicosas en las piernas, use medias de descanso. Eleve los pies durante 15minutos, 3 o 4veces por da. Limite el consumo de sal en su dieta. Cuidados prenatales  Programe las visitas prenatales para la semana12 de [place]embarazo. Generalmente se programan cada mes al principio y se hacen ms frecuentes en los 2 ltimos meses antes del parto.  Escriba sus preguntas. Llvelas cuando concurra a las visitas prenatales.  Concurra a todas las visitas prenatales tal como se lo haya indicado el mdico. Esto es importante. Seguridad  Use el cinturn de seguridad en todo momento mientras conduce.  Haga una lista de los nmeros de telfono de Associate Professoremergencia, que W. R. Berkleyincluya los nmeros de telfono de familiares, Morovisamigos, el hospital y los departamentos de polica y bomberos. Instrucciones generales  Pdale al mdico que la derive a clases de educacin prenatal en su localidad. Debe comenzar a tomar las clases antes de que empiece el mes6 de [place] Cityembarazo.  Pida ayuda si tiene necesidades nutricionales o de asesoramiento Academic librariandurante el embarazo. El mdico puede aconsejarla o derivarla a especialistas para que la ayuden con diferentes necesidades.  No se d baos de inmersin en agua caliente, baos turcos ni saunas.  No se haga duchas vaginales ni use tampones o toallas higinicas perfumadas.  No mantenga las piernas cruzadas durante [place] Beth[place]ucho tiempo.  Evite el contacto con las bandejas sanitarias de los gatos y la tierra que estos animales usan. Estos elementos contienen bacterias que pueden causar defectos congnitos al beb y la posible prdida del feto debido a un aborto espontneo o muerte fetal.  No fume, no consuma hierbas ni medicamentos que no hayan sido recetados por el mdico. Las sustancias qumicas que estos productos contienen afectan la formacin y el desarrollo del beb.  No consuma ningn  producto que contenga nicotina o tabaco, como cigarrillos y Administrator, Civil Servicecigarrillos electrnicos. Si necesita ayuda para dejar de fumar, consulte al American Expressmdico. Puede recibir asesoramiento y otro tipo de recursos para dejar de fumar.  Programe una cita con el dentista. En su casa, lvese los dientes con un cepillo dental blando y psese el hilo dental con suavidad. Comunquese con un mdico si:  Tiene mareos.  Siente clicos leves, presin en la pelvis o dolor persistente en el abdomen.  Tiene nuseas, vmitos o diarrea persistentes.  Brett Fairybserva una secrecin vaginal con mal olor.  Siente dolor al ConocoPhillipsorinar.  Observa ms hinchazn en la cara, las manos, las piernas o los tobillos.  Est expuesta a la quinta enfermedad o a la varicela.  Est expuesta al sarampin alemn (rubola) y nunca lo haba tenido. Solicite ayuda de inmediato si:  Tiene fiebre.  Tiene una prdida de lquido por la vagina.  Tiene sangrado o pequeas prdidas vaginales.  Siente dolor intenso o clicos en el abdomen.  Sube o baja de peso rpidamente.  Vomita sangre de color rojo brillante o una sustancia similar a los  granos de caf.  Dolor de cabeza intenso.  Le falta el aire.  Sufre cualquier tipo de traumatismo, por ejemplo, debido a una cada o un accidente automovilstico. Resumen  El primer trimestre de Psychiatrist se extiende desde la semana1 hasta el final de la semana13 (mes1 al mes3).  Su organismo atraviesa por muchos cambios durante el [place]. Estos cambios varan de [place] a [place].  Tendr visitas prenatales de rutina. Durante esas visitas, el mdico la examinar, hablar con usted acerca de los resultados de sus pruebas y Network engineer cmo se siente. Esta informacin no tiene Theme park manager el consejo del mdico. Asegrese de hacerle al mdico cualquier pregunta que tenga. Document Released: 12/09/2004 Document Revised: 06/04/2016 Document Reviewed: 06/04/2016 Elsevier Interactive Patient  Education  [employer].

## 2017-05-19 NOTE — Progress Notes (Signed)
  Hannah Orozco is a 37 y.o. yo G3P2002 at 413w6d who presents for her initial prenatal visit. Pregnancy is planned She reports nausea. Taking vitamin B6 with some help.  She is taking PNV. See flow sheet for details.  Tx for UTI last month. Denies dysuria.  Had low platelets at end of first pregnancy. No issues during second.  Hx depression about 6 years ago. Denies current symptoms of depression. 7 and 6 pound babies Both vaginal deliveries w/ epidural   PSH- stomach and esophagus surgery 2004 FH- mother HTN, DM, rheumatoid arthritis. Father healthy. No tobacco alcohol drug use  PMH, POBH, FH, meds, allergies and Social Hx reviewed.  Prenatal Exam: Gen: Well nourished, well developed.  No distress.  Vitals noted. HEENT: Normocephalic, atraumatic.  Neck supple without cervical lymphadenopathy, thyromegaly or thyroid nodules.  Fair dentition. CV: RRR no murmur, gallops or rubs Lungs: CTAB.  Normal respiratory effort without wheezes or rales. Abd: soft, NTND. +BS.  Uterus not appreciated above pelvis. GU: Normal external female genitalia without lesions.  Normal vaginal, well rugated without lesions. No vaginal discharge.  Bimanual exam: No adnexal mass or TTP. No CMT.  Uterus size congruent. Ext: No clubbing, cyanosis or edema. Psych: Normal grooming and dress.  Not depressed or anxious appearing.  Normal thought content and process without flight of ideas or looseness of associations.  Assessment & Plan: 1) 37 y.o. yo G3P2002 at 683w6d via LMP/1st trimester US doing well.  Current pregnancy issues include nausea. Dating is reliable. First trimester US at [facility] consistent. Prenatal labs reviewed, notable for UTI- will repeat urine studies today. Genetic screening offered: yes, will do integrated screen at our clinic and [facility]. She can also get pap for free at HD at that time. Early glucola is indicated. Will complete today. PHQ-9 and Pregnancy Medical Home  forms completed and reviewed.  Bleeding and pain precautions reviewed. Importance of prenatal vitamins reviewed.  Follow up in 4 weeks.  Hannah PattyAngela Lonnie Rosado, DO PGY-2, [facility] 05/19/2017 2:19 PM

## 2017-05-21 LAB — CULTURE, OB URINE

## 2017-05-21 LAB — URINE CULTURE, OB REFLEX

## 2017-05-23 ENCOUNTER — Other Ambulatory Visit (INDEPENDENT_AMBULATORY_CARE_PROVIDER_SITE_OTHER): Payer: Self-pay

## 2017-05-23 DIAGNOSIS — Z3401 Encounter for supervision of normal first pregnancy, first trimester: Secondary | ICD-10-CM

## 2017-05-23 LAB — GLUCOSE, POCT (MANUAL RESULT ENTRY): POC Glucose: 91 mg/dl (ref 70–99)

## 2017-05-24 LAB — GESTATIONAL GLUCOSE TOLERANCE
GLUCOSE 1 HOUR GTT: 95 mg/dL (ref 65–179)
GLUCOSE 2 HOUR GTT: 69 mg/dL (ref 65–154)
Glucose, Fasting: 81 mg/dL (ref 65–94)
Glucose, GTT - 3 Hour: 84 mg/dL (ref 65–139)

## 2017-05-26 NOTE — Progress Notes (Signed)
  Reviewed. No reason to treat. Patient w/o symptoms. Will repeat ucx later on in pregnancy.

## 2017-06-01 ENCOUNTER — Ambulatory Visit (INDEPENDENT_AMBULATORY_CARE_PROVIDER_SITE_OTHER): Payer: Self-pay | Admitting: Family Medicine

## 2017-06-01 DIAGNOSIS — R1013 Epigastric pain: Secondary | ICD-10-CM

## 2017-06-01 DIAGNOSIS — R109 Unspecified abdominal pain: Secondary | ICD-10-CM | POA: Insufficient documentation

## 2017-06-01 NOTE — Progress Notes (Signed)
Subjective  Patient is presenting with the following illnesses  ABDOMINAL PAIN For last week or two has had episodes of abdomen pain in upper abdomen and back only when she eats a regular meal. If eats small amounts or only drinks does not have pain.  Does not have nausea and vomiting with the pain or fever.  Her abdomen is a little tender to touch at other times.  Does have "morning sickness" type nausea that comes and goes but does not seem related to the pain.  Specifically reports the pain is not like her prior acid heart burn type pain.    Had a similar pain several years ago that just went away by itself  Is currently [redacted] weeks pregnant by US which showed single IUP   Chief Complaint noted Review of Symptoms - see HPI PMH - Smoking status noted.  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  No guarding or rebound.  Is very mildly tender around epigastric and flank areas.   Uterus not palpable fetal heart tones not identified  No CVAT Skin:  Intact without suspicious lesions or rashes  Assessments/Plans  Abdominal pain Uncertain cause.  Given intermittent nature and benign exam do not think related to appendix or pregnancy or obstruction.   Perhaps a morning sickness variant that abdominal distension with food causes discomfort.  Do not think pancreatitis given lack of vomiting but if persists might check an amylase.  Could be gall bladder pain and if persists could check US but given pregnancy might not address quickly.  Decided to monitor and see if resolves as pregnancy progresses.     See after visit summary for details of patient instuctions

## 2017-06-01 NOTE — Patient Instructions (Signed)
Good to see you today!  Thanks for coming in.  For your abdominal pain I would eat small amounts frequently.  If you get worsening pain or fever or pain when you are not eating or any vaginal discharge or bleeding come back immediately  Para tu dolor abdominal comera pequeas cantidades frecuentemente.  Si obtiene pan doloroso o pan de oro cuando no est comiendo o cualquier flujo vaginal o sangrado, regrese inmediatamente

## 2017-06-01 NOTE — Progress Notes (Signed)
S 

## 2017-06-01 NOTE — Assessment & Plan Note (Signed)
Uncertain cause.  Given intermittent nature and benign exam do not think related to appendix or pregnancy or obstruction.   Perhaps a morning sickness variant that abdominal distension with food causes discomfort.  Do not think pancreatitis given lack of vomiting but if persists might check an amylase.  Could be gall bladder pain and if persists could check US but given pregnancy might not address quickly.  Decided to monitor and see if resolves as pregnancy progresses.

## 2017-06-15 ENCOUNTER — Other Ambulatory Visit: Payer: Self-pay

## 2017-06-15 ENCOUNTER — Ambulatory Visit (INDEPENDENT_AMBULATORY_CARE_PROVIDER_SITE_OTHER): Payer: Self-pay | Admitting: Family Medicine

## 2017-06-15 VITALS — BP 102/58 | HR 92 | Temp 98.4°F | Wt 103.0 lb

## 2017-06-15 DIAGNOSIS — Z3481 Encounter for supervision of other normal pregnancy, first trimester: Secondary | ICD-10-CM

## 2017-06-15 NOTE — Progress Notes (Signed)
  [name] is a 37 y.o. G3P2002 at 9677w5d here for routine follow up.  She reports nausea. See flow sheet for details.  A/P: Pregnancy at 9277w5d.  Doing well.   Pregnancy issues include none. Anatomy ultrasound ordered, to be scheduled at 18-19 weeks at health department. Patient is interested in genetic screening. Will do quad screen at our clinic. Bleeding and pain precautions reviewed. Follow up 4 weeks.  Dolores PattyAngela Leanora Murin, DO PGY-2, [facility] 06/15/2017 1:57 PM

## 2017-06-15 NOTE — Patient Instructions (Addendum)
[facility] Monday April [facility]30 [facility]  If you have questions or concerns please do not hesitate to call at [phone].    Segundo trimestre de Psychiatristembarazo (Second Trimester of Pregnancy) El segundo trimestre va desde la semana13 hasta la 28, desde el cuarto hasta el sexto mes, y suele ser el momento en el que mejor se siente. En general, las nuseas matutinas han disminuido o han desaparecido completamente. Tendr ms energa y podr aumentarle el apetito. El beb por nacer (feto) se desarrolla rpidamente. Hacia el final del sexto mes, el beb mide aproximadamente 9 pulgadas (23 cm) y pesa alrededor de 1 libras (700 g). Es probable que sienta al beb moverse (dar pataditas) entre las 18 y 20 semanas del Psychiatristembarazo. CUIDADOS EN EL HOGAR  No fume, no consuma hierbas ni beba alcohol. No tome frmacos que el mdico no haya autorizado.  No consuma ningn producto que contenga tabaco, lo que incluye cigarrillos, tabaco de Theatre managermascar o Administrator, Civil Servicecigarrillos electrnicos. Si necesita ayuda para dejar de fumar, consulte al American Expressmdico. Puede recibir asesoramiento u otro tipo de apoyo para dejar de fumar.  Tome los medicamentos solamente como se lo haya indicado el mdico. Algunos medicamentos son seguros para tomar durante el Psychiatristembarazo y otros no lo son.  Haga ejercicios solamente como se lo haya indicado el mdico. Interrumpa la actividad fsica si comienza a tener calambres.  Ingiera alimentos saludables de [place] Heightsmanera regular.  Use un sostn que le brinde buen soporte si sus mamas estn sensibles.  No se d baos de inmersin en agua caliente, baos turcos ni saunas.  Colquese el cinturn de seguridad cuando conduzca.  No coma carne cruda ni queso sin cocinar; evite el contacto con las bandejas sanitarias de los gatos y la tierra que estos animales usan.  Tome las vitaminas prenatales.  Tome entre 1500 y 2000mg  de calcio diariamente comenzando en la semana20 del embarazo  Lattimerhasta el parto.  Pruebe tomar un medicamento que la ayude a defecar (un laxante suave) si el mdico lo autoriza. Consuma ms fibra, que se encuentra en las frutas y verduras frescas y los cereales integrales. Beba suficiente lquido para mantener el pis (orina) claro o de color amarillo plido.  Dese baos de asiento con agua tibia para Engineer, materialsaliviar el dolor o las molestias causadas por las hemorroides. Use una crema para las hemorroides si el mdico la autoriza.  Si se le hinchan las venas (venas varicosas), use medias de descanso. Levante (eleve) los pies durante 15minutos, 3 o 4veces por Futures traderda. Limite el consumo de sal en su dieta.  No levante objetos pesados, use zapatos de tacones bajos y sintese derecha.  Descanse con las piernas elevadas si tiene calambres o dolor de cintura.  Visite a su dentista si no lo ha Occupational hygienisthecho durante el embarazo. Use un cepillo de cerdas suaves para cepillarse los dientes. Psese el hilo dental con suavidad.  Puede seguir Calpine Corporationmanteniendo relaciones sexuales, a menos que el mdico le indique lo contrario.  Concurra a los controles mdicos.  SOLICITE AYUDA SI:  Siente mareos.  Sufre calambres o presin leves en la parte baja del vientre (abdomen).  Sufre un dolor persistente en el abdomen.  Tiene Programme researcher, broadcasting/film/videomalestar estomacal (nuseas), vmitos, o tiene deposiciones acuosas (diarrea).  Advierte un olor ftido que proviene de la vagina.  Siente dolor al ConocoPhillipsorinar.  SOLICITE AYUDA DE INMEDIATO SI:  Tiene fiebre.  Tiene una prdida de lquido por la vagina.  Tiene sangrado o pequeas prdidas vaginales.  Siente  dolor intenso o clicos en el abdomen.  Sube o baja de peso rpidamente.  Tiene dificultades para recuperar el aliento y siente dolor en el pecho.  Sbitamente se le hinchan mucho el rostro, las [place], los tobillos, los pies o las piernas.  No ha sentido los movimientos del beb durante Georgianne Fick.  Siente un dolor de cabeza intenso que no se alivia con  medicamentos.  Su visin se modifica.  Esta informacin no tiene Theme park manager el consejo del mdico. Asegrese de hacerle al mdico cualquier pregunta que tenga. Document Released: 11/01/2012 Document Revised: 03/22/2014 Document Reviewed: 05/02/2012 Elsevier Interactive Patient Education  2017 [employer].

## 2017-07-06 ENCOUNTER — Other Ambulatory Visit: Payer: Self-pay

## 2017-07-06 DIAGNOSIS — Z3492 Encounter for supervision of normal pregnancy, unspecified, second trimester: Secondary | ICD-10-CM

## 2017-07-20 ENCOUNTER — Telehealth: Payer: Self-pay | Admitting: Internal Medicine

## 2017-07-20 ENCOUNTER — Other Ambulatory Visit: Payer: Self-pay

## 2017-07-20 ENCOUNTER — Ambulatory Visit (INDEPENDENT_AMBULATORY_CARE_PROVIDER_SITE_OTHER): Payer: Self-pay | Admitting: Internal Medicine

## 2017-07-20 DIAGNOSIS — Z3482 Encounter for supervision of other normal pregnancy, second trimester: Secondary | ICD-10-CM | POA: Insufficient documentation

## 2017-07-20 NOTE — Telephone Encounter (Signed)
Reviewed negative quad screening with assistance of Spanish phone interpreter. Patient asked about her ultrasound results. She says she had anatomy scan at [facility]. I do not have that report but will update patient when it becomes available.   Dani Gobble, MD [facility], PGY-3

## 2017-07-20 NOTE — Patient Instructions (Signed)
Fue Psychiatrist conocerte! Tu pantalla gentica fue completamente normal. Por favor, vuelva a vernos en 4 semanas!  -Dr. Nancy Marus

## 2017-07-22 ENCOUNTER — Encounter: Payer: Self-pay | Admitting: Family Medicine

## 2017-07-22 NOTE — Progress Notes (Signed)
Hannah Orozco is a 37 y.o. G3P2002 at [redacted]w[redacted]d here for routine follow up.  She reports fetal movement. No vaginal bleeding, no leakage of fluids, no contractions.  See flow sheet for details.  A/P: Pregnancy at [redacted]w[redacted]d.  Doing well.   Pregnancy issues include none. Anatomy scan has been done [facility] on 4/[facility], not yet in chart. Per patient, ultrasound was normal. Will have PCP follow-up with this. Preterm labor precautions reviewed. Follow up 4 weeks.

## 2017-08-12 ENCOUNTER — Telehealth: Payer: Self-pay | Admitting: Internal Medicine

## 2017-08-12 NOTE — Telephone Encounter (Signed)
Called patient and discussed normal ultrasound results with help of Spanish phone interpreter. She was already aware of gender being female. EDD was 12/14/17, which is same as early [place] though < 5 day discrepancy since LMP that estimates due date at 12/16/17.   Dani Gobble, MD [facility], PGY-3

## 2017-08-15 ENCOUNTER — Encounter: Payer: Self-pay | Admitting: Internal Medicine

## 2017-08-25 ENCOUNTER — Ambulatory Visit (INDEPENDENT_AMBULATORY_CARE_PROVIDER_SITE_OTHER): Payer: Self-pay | Admitting: Internal Medicine

## 2017-08-25 DIAGNOSIS — Z3482 Encounter for supervision of other normal pregnancy, second trimester: Secondary | ICD-10-CM

## 2017-08-25 NOTE — Patient Instructions (Signed)
[name],   Pruebe Listerine enjuague bucal y cepille los dientes regularmente. Considere una limpieza dental de rutina para ayudar con la irritacin de las encas.  Regrese en 4 semanas para su prxima cita prenatal con trabajo de laboratorio.  Ests ganando peso bien y midiendo bien!  Try Listerine mouth wash and brush teeth regularly. Consider a routine dental cleaning to help with the gum irritation.  Please return in 4 weeks for your next prenatal appointment with lab work.  You are gaining weight well and measuring well!  Best, Dr. Miachel Roux trimestre de embarazo (Second Trimester of Pregnancy) El segundo trimestre va desde la semana13 hasta la 28, desde el cuarto hasta el sexto mes, y suele ser el momento en el que mejor se siente. En general, las nuseas matutinas han disminuido o han desaparecido completamente. Tendr ms energa y podr aumentarle el apetito. El beb por nacer (feto) se desarrolla rpidamente. Hacia el final del sexto mes, el beb mide aproximadamente 9 pulgadas (23 cm) y pesa alrededor de 1 libras (700 g). Es probable que sienta al beb moverse (dar pataditas) entre las 18 y 20 semanas del Psychiatrist. CUIDADOS EN EL HOGAR  No fume, no consuma hierbas ni beba alcohol. No tome frmacos que el mdico no haya autorizado.  No consuma ningn producto que contenga tabaco, lo que incluye cigarrillos, tabaco de Theatre manager o Administrator, Civil Service. Si necesita ayuda para dejar de fumar, consulte al American Express. Puede recibir asesoramiento u otro tipo de apoyo para dejar de fumar.  Tome los medicamentos solamente como se lo haya indicado el mdico. Algunos medicamentos son seguros para tomar durante el Psychiatrist y otros no lo son.  Haga ejercicios solamente como se lo haya indicado el mdico. Interrumpa la actividad fsica si comienza a tener calambres.  Ingiera alimentos saludables de [place] regular.  Use un sostn que le brinde buen soporte si sus mamas  estn sensibles.  No se d baos de inmersin en agua caliente, baos turcos ni saunas.  Colquese el cinturn de seguridad cuando conduzca.  No coma carne cruda ni queso sin cocinar; evite el contacto con las bandejas sanitarias de los gatos y la tierra que estos animales usan.  Tome las vitaminas prenatales.  Tome entre 1500 y 2000mg  de calcio diariamente comenzando en la semana20 del embarazo [place].  Pruebe tomar un medicamento que la ayude a defecar (un laxante suave) si el mdico lo autoriza. Consuma ms fibra, que se encuentra en las frutas y verduras frescas y los cereales integrales. Beba suficiente lquido para mantener el pis (orina) claro o de color amarillo plido.  Dese baos de asiento con agua tibia para Engineer, materials o las molestias causadas por las hemorroides. Use una crema para las hemorroides si el mdico la autoriza.  Si se le hinchan las venas (venas varicosas), use medias de descanso. Levante (eleve) los pies durante , 3 o 4veces por Futures trader. Limite el consumo de sal en su dieta.  No levante objetos pesados, use zapatos de tacones bajos y sintese derecha.  Descanse con las piernas elevadas si tiene calambres o dolor de cintura.  Visite a su dentista si no lo ha Occupational hygienist. Use un cepillo de cerdas suaves para cepillarse los dientes. Psese el hilo dental con suavidad.  Puede seguir [employer] Corporation, a menos que el mdico le indique lo contrario.  Concurra a los controles mdicos.  SOLICITE AYUDA SI:  Siente mareos.  Sufre calambres o presin  leves en la parte baja del vientre (abdomen).  Sufre un dolor persistente en el abdomen.  Tiene Programme researcher, broadcasting/film/videomalestar estomacal (nuseas), vmitos, o tiene deposiciones acuosas (diarrea).  Advierte un olor ftido que proviene de la vagina.  Siente dolor al ConocoPhillipsorinar.  SOLICITE AYUDA DE INMEDIATO SI:  Tiene fiebre.  Tiene una prdida de lquido por la vagina.  Tiene sangrado  o pequeas prdidas vaginales.  Siente dolor intenso o clicos en el abdomen.  Sube o baja de peso rpidamente.  Tiene dificultades para recuperar el aliento y siente dolor en el pecho.  Sbitamente se le hinchan mucho el rostro, las Moseleyvillemanos, los tobillos, los pies o las piernas.  No ha sentido los movimientos del beb durante Georgianne Fickuna hora.  Siente un dolor de cabeza intenso que no se alivia con medicamentos.  Su visin se modifica.  Esta informacin no tiene Theme park managercomo fin reemplazar el consejo del mdico. Asegrese de hacerle al mdico cualquier pregunta que tenga. Document Released: 11/01/2012 Document Revised: 03/22/2014 Document Reviewed: 05/02/2012 Elsevier Interactive Patient Education  2017 ArvinMeritorElsevier Inc.

## 2017-08-28 ENCOUNTER — Encounter: Payer: Self-pay | Admitting: Internal Medicine

## 2017-08-28 NOTE — Progress Notes (Signed)
Lolita CramMaria L Rios Shon HaleLeon is a 37 y.o. G3P2002 at 8462w6d by 1st trimester US for routine follow up.  Visit assisted by Spanish in-person interpreter through [employer]. She reports no vaginal bleeding, no abdominal pain, no dysuria. She reports frequent fetal movement. She asks if her weight is appropriate and mentions bleeding of her gums.   See flow sheet for details.  A/P: Pregnancy at 1662w6d.  Doing well.   Pregnancy issues include AMA; quad screen without increased risk. History of low platelets at end of first pregnancy.  Anatomy scan reviewed previously by phone and was normal. Infant female.  Recommended teeth cleaning by dentist and provided note for dentist. Recommended mouth wash in addition to regular brushing.  Preterm labor precautions reviewed. Follow up 4 weeks with OB clinic. Will need glucola testing, CBC, HIV, RPR next visit. Due for pap smear, as last was normal without HPV co-testing in 2014--would obtain with gc/chlamydia testing at 36/37 weeks.   Dani GobbleHillary Erisa Mehlman, MD [facility], PGY-3

## 2017-09-22 ENCOUNTER — Ambulatory Visit (INDEPENDENT_AMBULATORY_CARE_PROVIDER_SITE_OTHER): Payer: Self-pay | Admitting: Family Medicine

## 2017-09-22 VITALS — BP 94/58 | HR 81 | Temp 97.8°F | Wt 115.0 lb

## 2017-09-22 DIAGNOSIS — Z114 Encounter for screening for human immunodeficiency virus [HIV]: Secondary | ICD-10-CM

## 2017-09-22 DIAGNOSIS — Z113 Encounter for screening for infections with a predominantly sexual mode of transmission: Secondary | ICD-10-CM

## 2017-09-22 DIAGNOSIS — Z131 Encounter for screening for diabetes mellitus: Secondary | ICD-10-CM

## 2017-09-22 DIAGNOSIS — Z3482 Encounter for supervision of other normal pregnancy, second trimester: Secondary | ICD-10-CM

## 2017-09-22 LAB — POCT 1 HR PRENATAL GLUCOSE: Glucose 1 Hr Prenatal, POC: 124 mg/dL

## 2017-09-22 NOTE — Patient Instructions (Signed)
Cuidados prenatales (Prenatal Care) QU SON LOS CUIDADOS PRENATALES? Los cuidados prenatales son [place] se brindan a una embarazada antes del [place]. Los cuidados prenatales garantizan que la embarazada y el feto estn tan sanos como sea posible durante todo el [place]. Pueden brindar [place] tipo de cuidados [place], un mdico de atencin primaria o un especialista en parto y Media planner ([place]). Los cuidados prenatales incluyen exmenes fsicos, estudios, tratamientos e informacin sobre nutricin, estilo de vida y servicios de apoyo social. POR QU SON TAN IMPORTANTES LOS CUIDADOS PRENATALES? Los cuidados prenatales recibidos desde un inicio y de forma peridica aumentan la probabilidad de que usted y el beb permanezcan sanos durante todo el [place]. Este tipo de cuidados tambin reduce el riesgo de que el beb nazca mucho antes de la fecha probable de parto (prematuro) o de que sea ms pequeo de lo previsto (pequeo para la edad gestacional). Durante las visitas prenatales, se Programme researcher, broadcasting/film/video clase de enfermedad preexistente que usted pueda tener y que represente un riesgo durante el Media planner. Tambin la monitorearn con regularidad para Actuary afeccin que pueda surgir Solicitor, a fin de tratarla con rapidez y eficacia. QU SUCEDE DURANTE LAS VISITAS PRENATALES? Las visitas prenatales pueden incluir lo siguiente: Dilogo Informe al mdico cualquier signo o sntoma nuevo que haya tenido desde la ltima visita. Estos pueden incluir los siguientes:  Nuseas o vmitos.  Aumento o disminucin del nivel de [place].  Dificultad para dormir.  Dolor en la espalda o las piernas.  Cambios en [employer].  Ganas frecuentes de Garment/textile technologist.  Falta de aire al realizar actividad fsica.  Cambios en la piel, por ejemplo, una erupcin cutnea o picazn.  Sangrado o flujo vaginal.  Sensacin de excitacin o nerviosismo.  Cambios en los movimientos del feto. Es  conveniente que escriba cualquier pregunta o tema del que quiera hablar con el mdico, para llevarlo anotado a la cita. Exmenes Durante la primera visita prenatal, es probable que le hagan un examen fsico completo. El Viacom revisar con frecuencia la vagina, el cuello del tero y la posicin del tero, adems de examinarle el corazn, los pulmones y otras partes del cuerpo. A medida que el embarazo avance, el mdico medir el tamao del tero y Oncologist posicin del feto dentro del tero. Tambin puede examinarla para [employer] primeros signos del [place] de [place]. Las visitas prenatales tambin pueden incluir el control de la presin arterial y, despus de 10 a 77semanas de embarazo, aproximadamente, el control de los latidos del feto. Estudios Los estudios habituales suelen incluir lo siguiente:  Anlisis de [place]. Este anlisis examina la presencia de glucosa, protenas o signos de infeccin en la orina.  Recuento sanguneo. Este anlisis verifica el nivel de glbulos rojos y blancos en el organismo.  Pruebas de enfermedades de transmisin sexual (ETS). Las pruebas de Programme researcher, broadcasting/film/video de ETS al comienzo del embarazo son Ardelia Mems prctica de rutina, y en muchos estados es obligacin practicarlas.  Anlisis de anticuerpos. La examinarn para ver si es inmune a determinadas enfermedades, como la [place], que puede afectar al feto en desarrollo.  Deteccin de glucosa. Entre la semana 24y 28de embarazo, le analizarn el nivel de glucemia para detectar signos de diabetes gestacional. Pueden recomendarle un anlisis de seguimiento.  Estreptococos del grupoB. Es comn encontrar estas bacterias dentro de la vagina. Este [employer] indicar al mdico si necesita darle un antibitico para reducir la cantidad de este tipo de bacterias en el cuerpo antes del Viola de parto  y Bug Tussle.  Ecografas. Alrededor de la semana 18a 20de embarazo, muchas embarazadas se hacen ecografas para evaluar la  salud del feto y Hydrographic surveyor cualquier anomala en el desarrollo.  Prueba del VIH (virus de inmunodeficiencia humana). Al comienzo del Media planner, le harn una prueba de deteccin del VIH. Si corre un riesgo alto de [place] VIH, pueden repetirle esta prueba durante el tercer trimestre del embarazo. Pueden indicarle otro tipo de estudios segn su edad, sus antecedentes mdicos personales o familiares, u otros factores. [place] PARA LOS CUIDADOS PRENATALES? El programa de control correspondiente a los cuidados prenatales depender de cualquier enfermedad que usted tenga desde antes del embarazo o que haya desarrollado durante el mismo. Si usted no tiene Fish farm manager, es probable que le hagan los siguientes controles:  Furniture conservator/restorer vez al mes durante los primeros 63mses de eLiberty  Dos veces al mes durante el sptimo y el octavo mes de eGap  Una vez a la sTyson Foodsnoveno mes de eMedia plannery hBig Stone Gap Si presenta signos de trabajo de parto prematuro u otros signos o sntomas preocupantes, es posible que deba ver al mdico con ms frecuencia. Consulte al mBeazer Homesprograma de cuidados prenatales ms adecuado para su caso. QU PUEDO HACER PARA QUE EL BEB Y YO ESTEMOS TAN SANOS COMO SEA POSIBLE DURANTE EL EMBARAZO?  Tome una vitamina prenatal que contenga 4055mrogramos (0,1,6OMde cido flico toUS AirwaysEl mdico tambin puede indicarle que tome vitaminas adicionales, como yodo, vitaminaD, hierro, cobre y zinc.  ToChoctawe 1500 a 200089me calcio todUS Airwayssde la [id number] embResearch scientist (medical)AseRensselaer menos que el mdico le indique otra cosa: ? Debe aplicarse la vacuna contra la difteria, el ttanos y la tosferina (Tdap) entre la [id number] embarazo, independientemente de la fecha en la que recibi la ltima vacuna Tdap. Esta vacuna ayuda a proteger al beb contra la tosferina  despus del nacimiento. ? Debe recibir una vacuna antigripal inactivada (IIV) anual como ayuda para protegerlos a usted y al beb de la gripe. Puede recibirla en cualquier momento del embarazo.  Siga una dieta bien equilibrada, que incluya lo siguiente: ? FruLambert Modyverduras frescas. ? Protenas magras. ? Alimentos con altStarwood Hotels calcio, comJacksonvilleogHannauesos duros y verduras de hojas color verde oscuro. ? Panes integrales.  No coma frutos de mar con alto contenido de mercurio, por ejemplo: ? Pez espada. ? [place]. ? [place]. ? [place]. ? Ms de 6onLyda Kalata atn por semana.  No coma lo siguiente: ? Carnes o huevos crudos o mal cocidos. ? Alimentos no pasteurizados, como quesos blandos (brie, azuMedford Lakesfeta), jugos y lecGratz Embutidos. ? Salchichas que no se cocinaron en agua hirviendo.  Beba suficiente agua para mantener la orina clara o de color amarillo plido. Para muchas mujeres, la cantidad es de 10 o ms vasos de 8onzas de aguNurse, children'sl hecho de mantenerse hidratada ayuda a que el feto reciba nutrientes y pueProduct manager inicio de contracciones uterinas prematuras.  No consuma ningn producto que contenga tabaco, como cigarrillos, tabaco de masHigher education careers advisercigPsychologist, sport and exercisei necesita ayuda para dejar de fumar, consulte al mdico.  No consuma bebidas que contengan alcohol. No se ha determinado que haya un nivel de consumo de alcohol que sea inocuo durante el embFlowing WellsNo consuma drogas. Estas pueden daar al feto en  desarrollo o causar un aborto espontneo.  Consulte al mdico o al farmacutico antes de tomar cualquier medicamento recetado o de venta libre, hierbas o suplementos.  Limite el consumo de cafena a no ms de 200mg por da.  Haga actividad fsica. A menos que el mdico le indique otra cosa, intente hacer 30minutos de ejercicio moderado la mayora de los das de la semana. No practique actividades de alto impacto, deportes de contacto o actividades  con alto riesgo de cadas, como equitacin o esqu extremo.  Descanse lo suficiente.  Evite todo aquello que aumente la temperatura corporal, como jacuzzis y saunas.  Si tiene un gato, no vace la bandeja sanitaria. Las bacterias presentes en las heces del gato pueden causar una infeccin llamada toxoplasmosis. Esta puede daar gravemente al feto.  Aljese de las sustancias qumicas como insecticidas, plomo y mercurio, y de los productos de limpieza o pinturas que contengan solventes.  No se saque ninguna radiografa, excepto si es necesaria por razones mdicas.  Tome una clase de preparacin para el parto y el amamantamiento. Pregntele al mdico si necesita una derivacin o una recomendacin.  Esta informacin no tiene como fin reemplazar el consejo del mdico. Asegrese de hacerle al mdico cualquier pregunta que tenga. Document Released: 08/18/2007 Document Revised: 06/23/2015 Document Reviewed: 05/16/2013 Elsevier Interactive Patient Education  2017 Elsevier Inc.  

## 2017-09-22 NOTE — Progress Notes (Signed)
[name] is a 37 y.o. G3P2002 at 684w6d for routine follow up.  She reports: No concern, feels well.  See flow sheet for details.  A/P: Pregnancy at 4184w6d.  Doing well.   Pregnancy issues include: AMA  Infant feeding choice: Bottle and breastfeed. Contraception choice: Depo Infant circumcision desired no  Tdapwas given today. Letter to take to HD given today. She will also get PAP done at the HD. She already called them for scheduling. 1 hour glucola, CBC, RPR, and HIV were done today.   Pregnancy medical home forms were done today and reviewed.   RH status was reviewed and pt does not need Rhogam.  Rhogam was not given today.   Childbirth and education classes were not offered. Preterm labor precautions reviewed. Kick counts reviewed. Follow up 2 weeks.

## 2017-09-23 ENCOUNTER — Telehealth: Payer: Self-pay | Admitting: Family Medicine

## 2017-09-23 ENCOUNTER — Encounter: Payer: Self-pay | Admitting: Family Medicine

## 2017-09-23 LAB — CBC WITH DIFFERENTIAL/PLATELET
BASOS: 0 %
Basophils Absolute: 0 10*3/uL (ref 0.0–0.2)
EOS (ABSOLUTE): 0.1 10*3/uL (ref 0.0–0.4)
Eos: 1 %
Hematocrit: 31.9 % — ABNORMAL LOW (ref 34.0–46.6)
Hemoglobin: 10.5 g/dL — ABNORMAL LOW (ref 11.1–15.9)
IMMATURE GRANULOCYTES: 0 %
Immature Grans (Abs): 0 10*3/uL (ref 0.0–0.1)
LYMPHS ABS: 0.9 10*3/uL (ref 0.7–3.1)
Lymphs: 13 %
MCH: 31.7 pg (ref 26.6–33.0)
MCHC: 32.9 g/dL (ref 31.5–35.7)
MCV: 96 fL (ref 79–97)
MONOS ABS: 0.4 10*3/uL (ref 0.1–0.9)
Monocytes: 5 %
NEUTROS ABS: 5.5 10*3/uL (ref 1.4–7.0)
NEUTROS PCT: 81 %
PLATELETS: 145 10*3/uL — AB (ref 150–450)
RBC: 3.31 x10E6/uL — ABNORMAL LOW (ref 3.77–5.28)
RDW: 14.1 % (ref 12.3–15.4)
WBC: 6.9 10*3/uL (ref 3.4–10.8)

## 2017-09-23 LAB — HIV ANTIBODY (ROUTINE TESTING W REFLEX): HIV Screen 4th Generation wRfx: NONREACTIVE

## 2017-09-23 LAB — RPR: RPR Ser Ql: NONREACTIVE

## 2017-09-23 MED ORDER — FERROUS SULFATE 325 (65 FE) MG PO TABS: 325.0000 mg | ORAL_TABLET | Freq: Two times a day (BID) | ORAL | 0 refills | Status: DC

## 2017-09-23 NOTE — Telephone Encounter (Signed)
HIPAA compliant call back message left via interpreter.   When she calls back, let her know that her hemoglobin level dropped. Start ferrous sulfate two tabs BID. May get OTC

## 2017-09-23 NOTE — Telephone Encounter (Signed)
Using pacific interpreter [phone], I informed patient her iron is low and she needs to start taking an iron supplement.  I informed her this can be purchased over the counter.

## 2017-10-04 ENCOUNTER — Encounter: Payer: Self-pay | Admitting: Family Medicine

## 2017-10-06 NOTE — Progress Notes (Signed)
Hannah MendsMaria L Rios Orozco is a 37 y.o. G3P2002 at 7244w6d here for routine follow up.  She reports +FM, no ctx, no LOF, no bleeding. See flow sheet for details.  A/P: Pregnancy at 2144w6d.  Doing well.   Pregnancy issues include: 1. AMA; quad screen without increased risk 2. CBC, 1 hr glucola, HIV reviewed and WNL 3. Due for Pap, plan to do with GC/Chlamydia and GBS at 36 or 37 weeks  Preterm labor and fetal movement precautions reviewed. Follow up 4 weeks.

## 2017-10-07 ENCOUNTER — Encounter: Payer: Self-pay | Admitting: Student in an Organized Health Care Education/Training Program

## 2017-10-07 ENCOUNTER — Other Ambulatory Visit: Payer: Self-pay

## 2017-10-07 ENCOUNTER — Ambulatory Visit (INDEPENDENT_AMBULATORY_CARE_PROVIDER_SITE_OTHER): Payer: Self-pay | Admitting: Student in an Organized Health Care Education/Training Program

## 2017-10-07 VITALS — BP 92/62 | HR 85 | Temp 98.1°F | Wt 117.4 lb

## 2017-10-07 DIAGNOSIS — Z3482 Encounter for supervision of other normal pregnancy, second trimester: Secondary | ICD-10-CM

## 2017-10-07 NOTE — Patient Instructions (Addendum)
Please follow up for another visit in 4 weeks.   Tercer trimestre de Psychiatristembarazo (Third Trimester of Pregnancy) El tercer trimestre comprende desde la semana29 hasta la semana42, es decir, desde el mes7 hasta el [address]mes9. En este trimestre, el feto crece muy rpido. Hacia el final del noveno mes, el feto mide alrededor de 20pulgadas (45cm) de largo y pesa entre 6y 10libras [phone](2,700y 4,500kg). CUIDADOS EN EL HOGAR  No fume, no consuma hierbas ni beba alcohol. No tome frmacos que el mdico no haya autorizado.  No consuma ningn producto que contenga tabaco, lo que incluye cigarrillos, tabaco de Theatre managermascar o Administrator, Civil Servicecigarrillos electrnicos. Si necesita ayuda para dejar de fumar, consulte al American Expressmdico. Puede recibir asesoramiento u otro tipo de apoyo para dejar de fumar.  Tome los medicamentos solamente como se lo haya indicado el mdico. Algunos medicamentos son seguros para tomar durante el Psychiatristembarazo y otros no lo son.  Haga ejercicios solamente como se lo haya indicado el mdico. Interrumpa la actividad fsica si comienza a tener calambres.  Ingiera alimentos saludables de Fincastlemanera regular.  Use un sostn que le brinde buen soporte si sus mamas estn sensibles.  No se d baos de inmersin en agua caliente, baos turcos ni saunas.  Colquese el cinturn de seguridad cuando conduzca.  No coma carne cruda ni queso sin cocinar; evite el contacto con las bandejas sanitarias de los gatos y la tierra que estos animales usan.  Tome las vitaminas prenatales.  Tome entre 1500 y 2000mg  de calcio diariamente comenzando en la semana20 del embarazo Amityhasta el parto.  Pruebe tomar un medicamento que la ayude a defecar (un laxante suave) si el mdico lo autoriza. Consuma ms fibra, que se encuentra en las frutas y verduras frescas y los cereales integrales. Beba suficiente lquido para mantener el pis (orina) claro o de color amarillo plido.  Dese baos de asiento con agua tibia para Engineer, materialsaliviar el dolor o las  molestias causadas por las hemorroides. Use una crema para las hemorroides si el mdico la autoriza.  Si se le hinchan las venas (venas varicosas), use medias de descanso. Levante (eleve) los pies durante 15minutos, 3 o 4veces por Futures traderda. Limite el consumo de sal en su dieta.  No levante objetos pesados, use zapatos de tacones bajos y sintese derecha.  Descanse con las piernas elevadas si tiene calambres o dolor de cintura.  Visite a su dentista si no lo ha Occupational hygienisthecho durante el embarazo. Use un cepillo de cerdas suaves para cepillarse los dientes. Psese el hilo dental con suavidad.  Puede seguir Calpine Corporationmanteniendo relaciones sexuales, a menos que el mdico le indique lo contrario.  No haga viajes de larga distancia, excepto si es obligatorio y solamente con la aprobacin del mdico.  Tome clases prenatales.  Practique ir manejando al hospital.  Prepare el bolso que llevar al hospital.  Prepare la habitacin del beb.  Concurra a los controles mdicos.  SOLICITE AYUDA SI:  No est segura de si est en trabajo de parto o si ha roto la bolsa de las aguas.  Tiene mareos.  Siente calambres leves o presin en la parte inferior del abdomen.  Sufre un dolor persistente en el abdomen.  Tiene Programme researcher, broadcasting/film/videomalestar estomacal (nuseas), vmitos, o tiene deposiciones acuosas (diarrea).  Advierte un olor ftido que proviene de la vagina.  Siente dolor al ConocoPhillipsorinar.  SOLICITE AYUDA DE INMEDIATO SI:  Tiene fiebre.  Tiene una prdida de lquido por la vagina.  Tiene sangrado o pequeas prdidas vaginales.  Siente dolor intenso o clicos  en el abdomen.  Sube o baja de peso rpidamente.  Tiene dificultades para recuperar el aliento y siente dolor en el pecho.  Sbitamente se le hinchan mucho el rostro, las Toolevillemanos, los tobillos, los pies o las piernas.  No ha sentido los movimientos del beb durante Georgianne Fickuna hora.  Siente un dolor de cabeza intenso que no se alivia con medicamentos.  Su visin se  modifica.  Esta informacin no tiene Theme park managercomo fin reemplazar el consejo del mdico. Asegrese de hacerle al mdico cualquier pregunta que tenga. Document Released: 11/01/2012 Document Revised: 03/22/2014 Document Reviewed: 05/02/2012 Elsevier Interactive Patient Education  2017 ArvinMeritorElsevier Inc.

## 2017-11-04 ENCOUNTER — Ambulatory Visit (INDEPENDENT_AMBULATORY_CARE_PROVIDER_SITE_OTHER): Payer: Self-pay | Admitting: Family Medicine

## 2017-11-04 ENCOUNTER — Encounter: Payer: Self-pay | Admitting: Family Medicine

## 2017-11-04 ENCOUNTER — Other Ambulatory Visit: Payer: Self-pay

## 2017-11-04 VITALS — BP 92/48 | HR 88 | Temp 98.2°F | Wt 121.0 lb

## 2017-11-04 DIAGNOSIS — Z3480 Encounter for supervision of other normal pregnancy, unspecified trimester: Secondary | ICD-10-CM

## 2017-11-04 NOTE — Patient Instructions (Signed)
A great breastfeeding resource is [email or web address]  Call us if you have worsening trouble with the varicose vein.   Come back in 2 weeks.

## 2017-11-04 NOTE — Progress Notes (Signed)
Hannah MendsMaria L Rios Orozco is a 37 y.o. G3P2002 at 5128w0d here for routine follow up.  She reports some R groin. Hasn't chosen name yet. At home has 5 and 3 year olds at home. With 38yo, had PROM and had to get pitocin. Says with her [facility]. On chart review, this was preE requiring Magnesium. On chart review, no preE with second child.   Is taking iron BID. No constipation.   Had some pain of R groin worse with prolonged walking.   See flow sheet for details.  A/P: Pregnancy at 8328w0d.  Doing well.   Pregnancy issues include AMA.  Infant feeding choice: wants to feed both. Did this with other babies, states she added formula when they got home around a few days. Added formula in the ED when her son was a few days old with her son because they said he was hungry. Has gotten mastitis in the past and someone told her that she couldn't breastfed because she was on antibiotics. Instructed her that this is NOT the case and she should keep breastfeeding.  Contraception choice: wants to use depo Infant circumcision desired: no  Tdap was not given today. Given previously.   Breastfeeding - gave handout in Spanish on making enough milk.   Anemia - continue iron.   R groin pain - likely 2/2 pelvic pressure, normal ROM of hip on exam. Continue to monitor.   Preterm labor and fetal movement precautions reviewed. Safe sleep discussed. [facility]

## 2017-11-05 ENCOUNTER — Encounter: Payer: Self-pay | Admitting: Family Medicine

## 2017-11-18 ENCOUNTER — Encounter: Payer: Self-pay | Admitting: Family Medicine

## 2017-11-18 ENCOUNTER — Other Ambulatory Visit (HOSPITAL_COMMUNITY): Admission: RE | Admit: 2017-11-18 | Payer: Self-pay | Source: Ambulatory Visit | Admitting: Family Medicine

## 2017-11-18 ENCOUNTER — Ambulatory Visit (INDEPENDENT_AMBULATORY_CARE_PROVIDER_SITE_OTHER): Payer: Self-pay | Admitting: Family Medicine

## 2017-11-18 ENCOUNTER — Other Ambulatory Visit: Payer: Self-pay

## 2017-11-18 VITALS — BP 98/54 | HR 86 | Temp 98.3°F | Wt 122.8 lb

## 2017-11-18 DIAGNOSIS — Z3480 Encounter for supervision of other normal pregnancy, unspecified trimester: Secondary | ICD-10-CM

## 2017-11-18 NOTE — Progress Notes (Signed)
Hannah Orozco is a 37 y.o. G3P2002 at [redacted]w[redacted]d here for routine follow up.  She reports feeling well, no concerns, still with some R groin pain. See flow sheet for details.  A/P: Pregnancy at [redacted]w[redacted]d.  Doing well.   Pregnancy issues include anemia.  Infant feeding choice: breast Contraception choice: depo Infant circumcision desired: no  Tdap was not given today. given previously.  Flu shot not yet - will give letter for [facility].   Anemia - continue iron.   Hx of PreE - BP great today, will repeat CBC w smear today.   GBS and gc/chlamydia testing was performed today.  Preterm labor and fetal movement precautions reviewed. Safe sleep discussed. Follow up 1 week(s).

## 2017-11-18 NOTE — Patient Instructions (Signed)
It was a pleasure to see you today! Thank you for choosing [facility] for your primary care. Hannah Orozco was seen for pregnancy.   Our plans for today were:  Go to the health department to get flu shot and pap  Come back here in 1 week.   Best,  Dr. Chanetta Marshall

## 2017-11-21 LAB — CBC
HEMOGLOBIN: 11.7 g/dL (ref 11.1–15.9)
Hematocrit: 35.8 % (ref 34.0–46.6)
MCH: 32.3 pg (ref 26.6–33.0)
MCHC: 32.7 g/dL (ref 31.5–35.7)
MCV: 99 fL — AB (ref 79–97)
PLATELETS: 132 10*3/uL — AB (ref 150–450)
RBC: 3.62 x10E6/uL — ABNORMAL LOW (ref 3.77–5.28)
RDW: 14.1 % (ref 12.3–15.4)
WBC: 6.9 10*3/uL (ref 3.4–10.8)

## 2017-11-21 LAB — CERVICOVAGINAL ANCILLARY ONLY
Chlamydia: NEGATIVE
Neisseria Gonorrhea: NEGATIVE

## 2017-11-21 LAB — CULTURE, BETA STREP (GROUP B ONLY): STREP GP B CULTURE: POSITIVE — AB

## 2017-11-24 ENCOUNTER — Encounter: Payer: Self-pay | Admitting: Family Medicine

## 2017-11-24 ENCOUNTER — Other Ambulatory Visit: Payer: Self-pay

## 2017-11-24 ENCOUNTER — Ambulatory Visit (INDEPENDENT_AMBULATORY_CARE_PROVIDER_SITE_OTHER): Payer: Self-pay | Admitting: Family Medicine

## 2017-11-24 VITALS — BP 90/54 | HR 88 | Temp 98.3°F | Wt 124.4 lb

## 2017-11-24 DIAGNOSIS — Z3483 Encounter for supervision of other normal pregnancy, third trimester: Secondary | ICD-10-CM

## 2017-11-24 LAB — PATHOLOGIST SMEAR REVIEW

## 2017-11-24 NOTE — Patient Instructions (Addendum)
Thank you for coming in to see us today. Please see below to review our plan for tK[place]oday's visit.  Your anemia is controlled. We will see you in 1 week.  Please call the clinic at [phone] if your symptoms worsen or you have any concerns. It was our pleasure to serve you.  Durward Parcelavid McMullen, DO [facility], PGY-3

## 2017-11-24 NOTE — Progress Notes (Addendum)
Hannah Orozco L Rios Leon is a 37 y.o. G3P2002 at 7759w6d here for routine follow up.  She is accompanied by an interpreter Nohilia. She reports good fetal movement. See flow sheet for details.  A/P: Pregnancy at 6559w6d.  Doing well.   Pregnancy issues include anemia which appears to be well controlled on iron tablets.  Infant feeding choice: both Contraception choice: Depo Infant circumcision desired: no  Tdap was not given today. Given on 09/22/17. GBS and gc/chlamydia testing was not performed today. She is GBS positive with negative GC/Clamydia.  Preterm labor and fetal movement precautions reviewed. Safe sleep discussed. Follow up 1 week. Has OB clinic appt in 2 weeks.

## 2017-12-01 ENCOUNTER — Inpatient Hospital Stay (HOSPITAL_COMMUNITY)
Admission: AD | Admit: 2017-12-01 | Discharge: 2017-12-04 | DRG: 807 | Disposition: A | Discharge status: Home / Self Care | Payer: Medicaid Other | Attending: Obstetrics & Gynecology | Admitting: Obstetrics & Gynecology

## 2017-12-01 DIAGNOSIS — Z22330 Carrier of Group B streptococcus: Secondary | ICD-10-CM

## 2017-12-01 DIAGNOSIS — D6959 Other secondary thrombocytopenia: Secondary | ICD-10-CM | POA: Diagnosis present

## 2017-12-01 DIAGNOSIS — O9962 Diseases of the digestive system complicating childbirth: Principal | ICD-10-CM | POA: Diagnosis present

## 2017-12-01 DIAGNOSIS — Z23 Encounter for immunization: Secondary | ICD-10-CM

## 2017-12-01 DIAGNOSIS — O09299 Supervision of pregnancy with other poor reproductive or obstetric history, unspecified trimester: Secondary | ICD-10-CM

## 2017-12-01 DIAGNOSIS — O99354 Diseases of the nervous system complicating childbirth: Secondary | ICD-10-CM | POA: Diagnosis present

## 2017-12-01 DIAGNOSIS — O99824 Streptococcus B carrier state complicating childbirth: Secondary | ICD-10-CM | POA: Diagnosis present

## 2017-12-01 DIAGNOSIS — D696 Thrombocytopenia, unspecified: Secondary | ICD-10-CM

## 2017-12-01 DIAGNOSIS — O99119 Other diseases of the blood and blood-forming organs and certain disorders involving the immune mechanism complicating pregnancy, unspecified trimester: Secondary | ICD-10-CM

## 2017-12-01 DIAGNOSIS — O09529 Supervision of elderly multigravida, unspecified trimester: Secondary | ICD-10-CM

## 2017-12-01 DIAGNOSIS — O9902 Anemia complicating childbirth: Secondary | ICD-10-CM | POA: Diagnosis present

## 2017-12-01 DIAGNOSIS — O9912 Other diseases of the blood and blood-forming organs and certain disorders involving the immune mechanism complicating childbirth: Secondary | ICD-10-CM | POA: Diagnosis present

## 2017-12-01 DIAGNOSIS — K219 Gastro-esophageal reflux disease without esophagitis: Secondary | ICD-10-CM | POA: Diagnosis present

## 2017-12-01 DIAGNOSIS — D509 Iron deficiency anemia, unspecified: Secondary | ICD-10-CM

## 2017-12-01 DIAGNOSIS — Z3A38 38 weeks gestation of pregnancy: Secondary | ICD-10-CM

## 2017-12-01 DIAGNOSIS — G5601 Carpal tunnel syndrome, right upper limb: Secondary | ICD-10-CM | POA: Diagnosis present

## 2017-12-02 ENCOUNTER — Encounter (HOSPITAL_COMMUNITY): Payer: Self-pay

## 2017-12-02 ENCOUNTER — Other Ambulatory Visit: Payer: Self-pay

## 2017-12-02 ENCOUNTER — Encounter: Payer: Self-pay | Admitting: Family Medicine

## 2017-12-02 ENCOUNTER — Inpatient Hospital Stay (HOSPITAL_COMMUNITY): Payer: Medicaid Other | Admitting: Anesthesiology

## 2017-12-02 DIAGNOSIS — K219 Gastro-esophageal reflux disease without esophagitis: Secondary | ICD-10-CM | POA: Diagnosis present

## 2017-12-02 DIAGNOSIS — O99354 Diseases of the nervous system complicating childbirth: Secondary | ICD-10-CM | POA: Diagnosis present

## 2017-12-02 DIAGNOSIS — D509 Iron deficiency anemia, unspecified: Secondary | ICD-10-CM | POA: Diagnosis present

## 2017-12-02 DIAGNOSIS — G5601 Carpal tunnel syndrome, right upper limb: Secondary | ICD-10-CM | POA: Diagnosis present

## 2017-12-02 DIAGNOSIS — D6959 Other secondary thrombocytopenia: Secondary | ICD-10-CM | POA: Diagnosis present

## 2017-12-02 DIAGNOSIS — O9962 Diseases of the digestive system complicating childbirth: Secondary | ICD-10-CM | POA: Diagnosis present

## 2017-12-02 DIAGNOSIS — O99824 Streptococcus B carrier state complicating childbirth: Secondary | ICD-10-CM | POA: Diagnosis present

## 2017-12-02 DIAGNOSIS — O9902 Anemia complicating childbirth: Secondary | ICD-10-CM | POA: Diagnosis present

## 2017-12-02 DIAGNOSIS — Z23 Encounter for immunization: Secondary | ICD-10-CM | POA: Diagnosis not present

## 2017-12-02 DIAGNOSIS — O9912 Other diseases of the blood and blood-forming organs and certain disorders involving the immune mechanism complicating childbirth: Secondary | ICD-10-CM | POA: Diagnosis present

## 2017-12-02 DIAGNOSIS — Z3483 Encounter for supervision of other normal pregnancy, third trimester: Secondary | ICD-10-CM | POA: Diagnosis present

## 2017-12-02 DIAGNOSIS — Z3A38 38 weeks gestation of pregnancy: Secondary | ICD-10-CM | POA: Diagnosis not present

## 2017-12-02 LAB — CBC
HEMATOCRIT: 36.8 % (ref 36.0–46.0)
HEMATOCRIT: 34.4 % — AB (ref 36.0–46.0)
HEMOGLOBIN: 12.5 g/dL (ref 12.0–15.0)
Hemoglobin: 11.8 g/dL — ABNORMAL LOW (ref 12.0–15.0)
MCH: 32.1 pg (ref 26.0–34.0)
MCH: 32.7 pg (ref 26.0–34.0)
MCHC: 34 g/dL (ref 30.0–36.0)
MCHC: 34.3 g/dL (ref 30.0–36.0)
MCV: 94.6 fL (ref 78.0–100.0)
MCV: 95.3 fL (ref 78.0–100.0)
PLATELETS: 124 10*3/uL — AB (ref 150–400)
Platelets: 121 10*3/uL — ABNORMAL LOW (ref 150–400)
RBC: 3.89 MIL/uL (ref 3.87–5.11)
RBC: 3.61 MIL/uL — ABNORMAL LOW (ref 3.87–5.11)
RDW: 13.4 % (ref 11.5–15.5)
RDW: 13.6 % (ref 11.5–15.5)
WBC: 12 10*3/uL — ABNORMAL HIGH (ref 4.0–10.5)
WBC: 14.6 10*3/uL — AB (ref 4.0–10.5)

## 2017-12-02 LAB — RPR: RPR: NONREACTIVE

## 2017-12-02 LAB — TYPE AND SCREEN
ABO/RH(D): O POS
Antibody Screen: NEGATIVE

## 2017-12-02 MED ORDER — LACTATED RINGERS IV SOLN
INTRAVENOUS | Status: DC
  Administered 2017-12-02: 02:00:00 via INTRAVENOUS

## 2017-12-02 MED ORDER — FLEET ENEMA 7-19 GM/118ML RE ENEM: 1.0000 | ENEMA | RECTAL | Status: DC | PRN

## 2017-12-02 MED ORDER — FLEET ENEMA 7-19 GM/118ML RE ENEM: 1.0000 | ENEMA | Freq: Every day | RECTAL | Status: DC | PRN

## 2017-12-02 MED ORDER — PRENATAL MULTIVITAMIN CH
1.0000 | ORAL_TABLET | Freq: Every day | ORAL | Status: DC
  Administered 2017-12-03: 1 via ORAL
  Filled 2017-12-02: qty 1

## 2017-12-02 MED ORDER — FENTANYL CITRATE (PF) 100 MCG/2ML IJ SOLN: 50.0000 ug | INTRAMUSCULAR | Status: DC | PRN

## 2017-12-02 MED ORDER — LACTATED RINGERS IV SOLN
500.0000 mL | INTRAVENOUS | Status: DC | PRN
  Administered 2017-12-02: 500 mL via INTRAVENOUS

## 2017-12-02 MED ORDER — LIDOCAINE HCL (PF) 1 % IJ SOLN
INTRAMUSCULAR | Status: AC
  Filled 2017-12-02: qty 30

## 2017-12-02 MED ORDER — EPHEDRINE 5 MG/ML INJ
10.0000 mg | INTRAVENOUS | Status: DC | PRN
  Filled 2017-12-02: qty 2

## 2017-12-02 MED ORDER — TETANUS-DIPHTH-ACELL PERTUSSIS 5-2.5-18.5 LF-MCG/0.5 IM SUSP: 0.5000 mL | Freq: Once | INTRAMUSCULAR | Status: DC

## 2017-12-02 MED ORDER — DOCUSATE SODIUM 100 MG PO CAPS
100.0000 mg | ORAL_CAPSULE | Freq: Two times a day (BID) | ORAL | Status: DC
  Administered 2017-12-02 – 2017-12-03 (×3): 100 mg via ORAL
  Filled 2017-12-02 (×3): qty 1

## 2017-12-02 MED ORDER — OXYCODONE-ACETAMINOPHEN 5-325 MG PO TABS: 2.0000 | ORAL_TABLET | ORAL | Status: DC | PRN

## 2017-12-02 MED ORDER — ONDANSETRON HCL 4 MG/2ML IJ SOLN: 4.0000 mg | INTRAMUSCULAR | Status: DC | PRN

## 2017-12-02 MED ORDER — SODIUM CHLORIDE 0.9 % IV SOLN
2.0000 g | Freq: Four times a day (QID) | INTRAVENOUS | Status: DC
  Administered 2017-12-02: 2 g via INTRAVENOUS
  Filled 2017-12-02: qty 2
  Filled 2017-12-02 (×2): qty 2000

## 2017-12-02 MED ORDER — DIPHENHYDRAMINE HCL 50 MG/ML IJ SOLN: 12.5000 mg | INTRAMUSCULAR | Status: DC | PRN

## 2017-12-02 MED ORDER — OXYTOCIN 40 UNITS IN LACTATED RINGERS INFUSION - SIMPLE MED: 2.5000 [IU]/h | INTRAVENOUS | Status: DC

## 2017-12-02 MED ORDER — ZOLPIDEM TARTRATE 5 MG PO TABS: 5.0000 mg | ORAL_TABLET | Freq: Every evening | ORAL | Status: DC | PRN

## 2017-12-02 MED ORDER — MEASLES, MUMPS & RUBELLA VAC ~~LOC~~ INJ
0.5000 mL | INJECTION | Freq: Once | SUBCUTANEOUS | Status: DC
  Filled 2017-12-02: qty 0.5

## 2017-12-02 MED ORDER — SIMETHICONE 80 MG PO CHEW: 80.0000 mg | CHEWABLE_TABLET | ORAL | Status: DC | PRN

## 2017-12-02 MED ORDER — FENTANYL 2.5 MCG/ML BUPIVACAINE 1/10 % EPIDURAL INFUSION (WH - ANES)
14.0000 mL/h | INTRAMUSCULAR | Status: DC | PRN
  Administered 2017-12-02: 14 mL/h via EPIDURAL

## 2017-12-02 MED ORDER — PHENYLEPHRINE 40 MCG/ML (10ML) SYRINGE FOR IV PUSH (FOR BLOOD PRESSURE SUPPORT)
80.0000 ug | PREFILLED_SYRINGE | INTRAVENOUS | Status: DC | PRN
  Filled 2017-12-02: qty 5

## 2017-12-02 MED ORDER — ONDANSETRON HCL 4 MG PO TABS: 4.0000 mg | ORAL_TABLET | ORAL | Status: DC | PRN

## 2017-12-02 MED ORDER — ACETAMINOPHEN 325 MG PO TABS: 650.0000 mg | ORAL_TABLET | ORAL | Status: DC | PRN

## 2017-12-02 MED ORDER — IBUPROFEN 600 MG PO TABS
600.0000 mg | ORAL_TABLET | Freq: Four times a day (QID) | ORAL | Status: DC
  Administered 2017-12-02 – 2017-12-04 (×8): 600 mg via ORAL
  Filled 2017-12-02 (×8): qty 1

## 2017-12-02 MED ORDER — DIPHENHYDRAMINE HCL 25 MG PO CAPS: 25.0000 mg | ORAL_CAPSULE | Freq: Four times a day (QID) | ORAL | Status: DC | PRN

## 2017-12-02 MED ORDER — METHYLERGONOVINE MALEATE 0.2 MG/ML IJ SOLN: 0.2000 mg | INTRAMUSCULAR | Status: DC | PRN

## 2017-12-02 MED ORDER — COCONUT OIL OIL: 1.0000 "application " | TOPICAL_OIL | Status: DC | PRN

## 2017-12-02 MED ORDER — ONDANSETRON HCL 4 MG/2ML IJ SOLN: 4.0000 mg | Freq: Four times a day (QID) | INTRAMUSCULAR | Status: DC | PRN

## 2017-12-02 MED ORDER — WITCH HAZEL-GLYCERIN EX PADS: 1.0000 "application " | MEDICATED_PAD | CUTANEOUS | Status: DC | PRN

## 2017-12-02 MED ORDER — OXYTOCIN 40 UNITS IN LACTATED RINGERS INFUSION - SIMPLE MED
INTRAVENOUS | Status: AC
  Administered 2017-12-02: 500 mL via INTRAVENOUS
  Filled 2017-12-02: qty 1000

## 2017-12-02 MED ORDER — LACTATED RINGERS IV SOLN: 500.0000 mL | Freq: Once | INTRAVENOUS | Status: DC

## 2017-12-02 MED ORDER — OXYTOCIN BOLUS FROM INFUSION
500.0000 mL | Freq: Once | INTRAVENOUS | Status: AC
  Administered 2017-12-02: 500 mL via INTRAVENOUS

## 2017-12-02 MED ORDER — ACETAMINOPHEN 325 MG PO TABS
650.0000 mg | ORAL_TABLET | ORAL | Status: DC | PRN
  Administered 2017-12-02: 650 mg via ORAL
  Filled 2017-12-02: qty 2

## 2017-12-02 MED ORDER — LIDOCAINE HCL (PF) 1 % IJ SOLN
30.0000 mL | INTRAMUSCULAR | Status: DC | PRN
  Filled 2017-12-02: qty 30

## 2017-12-02 MED ORDER — BISACODYL 10 MG RE SUPP: 10.0000 mg | Freq: Every day | RECTAL | Status: DC | PRN

## 2017-12-02 MED ORDER — DIBUCAINE 1 % RE OINT: 1.0000 "application " | TOPICAL_OINTMENT | RECTAL | Status: DC | PRN

## 2017-12-02 MED ORDER — HYDROXYZINE HCL 50 MG PO TABS
50.0000 mg | ORAL_TABLET | Freq: Four times a day (QID) | ORAL | Status: DC | PRN
  Filled 2017-12-02: qty 1

## 2017-12-02 MED ORDER — SOD CITRATE-CITRIC ACID 500-334 MG/5ML PO SOLN: 30.0000 mL | ORAL | Status: DC | PRN

## 2017-12-02 MED ORDER — OXYCODONE HCL 5 MG PO TABS: 5.0000 mg | ORAL_TABLET | ORAL | Status: DC | PRN

## 2017-12-02 MED ORDER — OXYCODONE-ACETAMINOPHEN 5-325 MG PO TABS: 1.0000 | ORAL_TABLET | ORAL | Status: DC | PRN

## 2017-12-02 MED ORDER — BENZOCAINE-MENTHOL 20-0.5 % EX AERO: 1.0000 "application " | INHALATION_SPRAY | CUTANEOUS | Status: DC | PRN

## 2017-12-02 MED ORDER — OXYCODONE HCL 5 MG PO TABS: 10.0000 mg | ORAL_TABLET | ORAL | Status: DC | PRN

## 2017-12-02 MED ORDER — PHENYLEPHRINE 40 MCG/ML (10ML) SYRINGE FOR IV PUSH (FOR BLOOD PRESSURE SUPPORT)
PREFILLED_SYRINGE | INTRAVENOUS | Status: AC
  Filled 2017-12-02: qty 20

## 2017-12-02 MED ORDER — METHYLERGONOVINE MALEATE 0.2 MG PO TABS: 0.2000 mg | ORAL_TABLET | ORAL | Status: DC | PRN

## 2017-12-02 MED ORDER — FERROUS SULFATE 325 (65 FE) MG PO TABS
325.0000 mg | ORAL_TABLET | Freq: Two times a day (BID) | ORAL | Status: DC
  Administered 2017-12-02 – 2017-12-03 (×3): 325 mg via ORAL
  Filled 2017-12-02 (×3): qty 1

## 2017-12-02 MED ORDER — FENTANYL 2.5 MCG/ML BUPIVACAINE 1/10 % EPIDURAL INFUSION (WH - ANES)
INTRAMUSCULAR | Status: AC
  Filled 2017-12-02: qty 100

## 2017-12-02 MED ORDER — LIDOCAINE HCL (PF) 1 % IJ SOLN
INTRAMUSCULAR | Status: DC | PRN
  Administered 2017-12-02: 2 mL via EPIDURAL
  Administered 2017-12-02: 5 mL via EPIDURAL
  Administered 2017-12-02: 3 mL via EPIDURAL

## 2017-12-02 NOTE — Anesthesia Procedure Notes (Signed)
Epidural Patient location during procedure: OB Start time: 12/02/2017 2:30 AM End time: 12/02/2017 2:40 AM  Staffing Anesthesiologist: Marcene DuosFitzgerald, Gerene Nedd, MD Performed: anesthesiologist   Preanesthetic Checklist Completed: patient identified, site marked, surgical consent, pre-op evaluation, timeout performed, IV checked, risks and benefits discussed and monitors and equipment checked  Epidural Patient position: sitting Prep: site prepped and draped and DuraPrep Patient monitoring: continuous pulse ox and blood pressure Approach: midline Location: L4-L5 Injection technique: LOR air  Needle:  Needle type: Tuohy  Needle gauge: 17 G Needle length: 9 cm and 9 Needle insertion depth: 5 cm cm Catheter type: closed end flexible Catheter size: 19 Gauge Catheter at skin depth: 10 cm Test dose: negative  Assessment Events: blood not aspirated, injection not painful, no injection resistance, negative IV test and no paresthesia

## 2017-12-02 NOTE — Anesthesia Postprocedure Evaluation (Signed)
Anesthesia Post Note  Patient: Hannah Orozco  Procedure(s) Performed: AN AD HOC LABOR EPIDURAL     Patient location during evaluation: Mother Baby Anesthesia Type: Epidural Level of consciousness: awake and alert and oriented Pain management: satisfactory to patient Vital Signs Assessment: post-procedure vital signs reviewed and stable Respiratory status: respiratory function stable Cardiovascular status: stable Postop Assessment: no headache, no backache, epidural receding, patient able to bend at knees, no signs of nausea or vomiting and adequate PO intake Anesthetic complications: no    Last Vitals:  Vitals:   12/02/17 0540 12/02/17 0646  BP: 108/65 94/60  Pulse: 68 69  Resp: 16 17  Temp: 36.7 C 37 C  SpO2: 99% 98%    Last Pain:  Vitals:   12/02/17 0646  TempSrc: Oral  PainSc: 0-No pain   Pain Goal:                 Mehul Rudin

## 2017-12-02 NOTE — MAU Note (Signed)
Pt contracting since 2000, 3 mins apart. Denies LOF. Small amount bloody show. +FM.

## 2017-12-02 NOTE — Progress Notes (Signed)
Jamesetta OrleansEricka, video interpreter [phone]#[id number] used to facilitate pushing

## 2017-12-02 NOTE — Lactation Note (Addendum)
This note was copied from a baby's chart. Lactation Consultation Note  Patient Name: Hannah Edwin DadaMaria Rios Leon ZOXWR'UToday's Date: 12/02/2017 Reason for consult: Initial assessment;Early term 37-38.6wks;Other (Comment)(AMA)  16 hours old early term female who is being partially BF and formula fed by his mother, that was her feeding choice upon admission. She is a P3 and experienced BF. She was able to BF her first child for 9 months and her second one for 24 months. [facility] and she already knows how to hand express. Big drops [facility]rum [facility], mom has a Medela DEBP at home.  Baby was sleeping on bassinet when entering the room, offered assistance with latch but mom politely declined stating that baby already fed, he had 25 ml of Gerber Gentle formula on his last feeding. Discussed with parents adequate volumes for supplementation according to baby's age, shared supplementation handout (SP). Parents voiced understanding, mom will try to put baby to the breast first the next time he shows hunger cues.  Plan:  1. Encouraged mom to feed baby at the breast STS 8-12 times/24 hours or sooner if feeding cues are present 2. If parents choose to supplement with Gerber gentle, they'll check formula supplementation chart before offering baby a bottle. 3. Parents will request slow flow nipples when asking for formula. Standard nipples were brought to the room.  BF brochure (SP), BF resources and feeding diary (SP) were reviewed, both parents are aware of LC services and will call PRN.  Maternal Data Formula Feeding for Exclusion: Yes Reason for exclusion: Mother's choice to formula and breast feed on admission Has patient been taught Hand Expression?: Yes Does the patient have breastfeeding experience prior to this delivery?: Yes  Feeding      Interventions Interventions: Breast feeding basics reviewed;Hand express;Breast  compression;Breast massage  Lactation Tools Discussed/Used WIC Program: Yes   Consult Status Consult Status: Follow-up Date: 12/03/17 Follow-up type: In-patient    Hannah Orozco 12/02/2017, 8:23 PM

## 2017-12-02 NOTE — Anesthesia Preprocedure Evaluation (Signed)
Anesthesia Evaluation  Patient identified by MRN, date of birth, ID band Patient awake    Reviewed: Allergy & Precautions, Patient's Chart, lab work & pertinent test results  Airway Mallampati: II  TM Distance: >3 FB     Dental   Pulmonary neg pulmonary ROS,    Pulmonary exam normal        Cardiovascular negative cardio ROS Normal cardiovascular exam     Neuro/Psych negative neurological ROS     GI/Hepatic Neg liver ROS, GERD  ,  Endo/Other  negative endocrine ROS  Renal/GU negative Renal ROS     Musculoskeletal   Abdominal   Peds  Hematology negative hematology ROS (+)   Anesthesia Other Findings   Reproductive/Obstetrics                             Lab Results  Component Value Date   WBC 12.0 (H) 12/02/2017   HGB 12.5 12/02/2017   HCT 36.8 12/02/2017   MCV 94.6 12/02/2017   PLT 121 (L) 12/02/2017    Anesthesia Physical Anesthesia Plan  ASA: II  Anesthesia Plan: Epidural   Post-op Pain Management:    Induction:   PONV Risk Score and Plan: Treatment may vary due to age or medical condition  Airway Management Planned: Natural Airway  Additional Equipment:   Intra-op Plan:   Post-operative Plan:   Informed Consent: I have reviewed the patients History and Physical, chart, labs and discussed the procedure including the risks, benefits and alternatives for the proposed anesthesia with the patient or authorized representative who has indicated his/her understanding and acceptance.     Plan Discussed with:   Anesthesia Plan Comments:         Anesthesia Quick Evaluation

## 2017-12-02 NOTE — H&P (Addendum)
Hannah Orozco Hannah Orozco is a 37 y.o. female [phone] with IUP at [redacted]w[redacted]d presenting for contractions. Pt states she has been having irregular, every 3-5 minutes contractions, associated with none vaginal bleeding for 5 hours..  Membranes are intact, with active fetal movement.   [facility]  Prenatal History/Complications:  SVD at term X71  Past Medical History: Past Medical History:  Diagnosis Date  . Anemia   . Anxiety    no meds, doing ok  . Depression    was on antidepressants ~54yrs ago (2013), fine now  . GERD (gastroesophageal reflux disease)   . Thrombocytopenia (HCC)     Past Surgical History: Past Surgical History:  Procedure Laterality Date  . ESOPHAGUS SURGERY  ~2004   laparoscopic, ? opened up esophagus so she could eat/swallow    Obstetrical History: OB History    Gravida  3   Para  2   Term  2   Preterm      AB      Living  2     SAB      TAB      Ectopic      Multiple  0   Live Births  2            Social History: Social History   Socioeconomic History  . Marital status: Married    Spouse name: Not on file  . Number of children: Not on file  . Years of education: Not on file  . Highest education level: Not on file  Occupational History  . Not on file  Social Needs  . Financial resource strain: Not on file  . Food insecurity:    Worry: Not on file    Inability: Not on file  . Transportation needs:    Medical: Not on file    Non-medical: Not on file  Tobacco Use  . Smoking status: Never Smoker  . Smokeless tobacco: Never Used  Substance and Sexual Activity  . Alcohol use: No  . Drug use: No  . Sexual activity: Yes  Lifestyle  . Physical activity:    Days per week: Not on file    Minutes per session: Not on file  . Stress: Not on file  Relationships  . Social connections:    Talks on phone: Not on file    Gets together: Not on file    Attends religious service: Not on file    Active member of club or  organization: Not on file    Attends meetings of clubs or organizations: Not on file    Relationship status: Not on file  Other Topics Concern  . Not on file  Social History Narrative   Married. Works Education officer, environmental houses.     Family History: Family History  Problem Relation Age of Onset  . Hypertension Mother   . Diabetes Mother   . Hearing loss Neg Hx   . Asthma Neg Hx   . Cancer Neg Hx   . Heart disease Neg Hx   . Stroke Neg Hx     Allergies: No Known Allergies  Medications Prior to Admission  Medication Sig Dispense Refill Last Dose  . ferrous sulfate 325 (65 FE) MG tablet Take 1 tablet (325 mg total) by mouth 2 (two) times daily with a meal. 60 tablet 0 11/30/2017 at Unknown time  . Prenatal Vit-Fe Fumarate-FA (PRENATAL MULTIVITAMIN) TABS tablet Take 1 tablet by mouth daily. 30 tablet 3 11/30/2017 at Unknown time  .  ranitidine (ZANTAC) 150 MG tablet Take 1 tablet (150 mg total) by mouth 2 (two) times daily as needed for heartburn. 60 tablet 12 11/30/2017 at Unknown time  . ondansetron (ZOFRAN) 4 MG tablet Take 1 tablet (4 mg total) by mouth every 8 (eight) hours as needed for nausea or vomiting. 10 tablet 2 Taking  . vitamin B-6 (PYRIDOXINE) 25 MG tablet Take 1 tablet (25 mg total) by mouth 3 (three) times daily as needed. 90 tablet 0 Taking        Review of Systems   Constitutional: Negative for fever and chills Eyes: Negative for visual disturbances Respiratory: Negative for shortness of breath, dyspnea Cardiovascular: Negative for chest pain or palpitations  Gastrointestinal: Negative for vomiting, diarrhea and constipation.  POSITIVE for abdominal pain (contractions) Genitourinary: Negative for dysuria and urgency Musculoskeletal: Negative for back pain, joint pain, myalgias  Neurological: Negative for dizziness and headaches      Blood pressure 111/66, pulse 78, temperature 98.4 F (36.9 C), temperature source Oral, resp. rate 18, last menstrual period  03/11/2017, SpO2 97 %, unknown if currently breastfeeding. General appearance: alert, cooperative and mild distress Lungs: clear to auscultation bilaterally Heart: regular rate and rhythm Abdomen: soft, non-tender; bowel sounds normal Extremities: Homans sign is negative, no sign of DVT DTR's 2+ Presentation: cephalic Fetal monitoring  Baseline: 145 bpm, Variability: Good {> 6 bpm), Accelerations: Reactive and Decelerations: Absent Uterine activity  2-4 minutes, moderte 3-4/80/-2 (changed from 2/50)  Prenatal labs: ABO, Rh: O/Positive/-- (02/19 1434) Antibody: Negative (02/19 1434) Rubella: 6.03 (02/19 1434) RPR: Non Reactive (07/11 1138)  HBsAg: Negative (02/19 1434)  HIV: Non Reactive (07/11 1138)  GBS:   + 1 hr Glucola 124 Genetic screening  Neg AFP Anatomy US normal bou  Prenatal Transfer Tool  Maternal Diabetes: No Genetic Screening: Normal Maternal Ultrasounds/Referrals: Normal Fetal Ultrasounds or other Referrals:  None Maternal Substance Abuse:  No Significant Maternal Medications:  None Significant Maternal Lab Results: Lab values include: Group B Strep positive     No results found for this or any previous visit (from the past 24 hour(s)).  Assessment: Hannah Orozco is a 37 y.o. [phone] with an IUP at 6914w0d presenting for early labor  Plan: #Labor: expectant management #Pain:  Per request #FWB Cat 1 #ID: GBS: PCN  #MOF:  Breast #MOC: depo   Jacklyn ShellFrances Cresenzo-Dishmon 12/02/2017, 1:11 AM

## 2017-12-02 NOTE — Progress Notes (Signed)
Hannah Orozco, video interpreter [phone] used throughout epidural procedure to ensure patient understanding

## 2017-12-02 NOTE — Progress Notes (Signed)
Mikle BosworthCarlos, video interpreter [phone] used to facilitate completion of admission questions

## 2017-12-03 MED ORDER — INFLUENZA VAC SPLIT QUAD 0.5 ML IM SUSY
0.5000 mL | PREFILLED_SYRINGE | INTRAMUSCULAR | Status: AC
  Administered 2017-12-04: 0.5 mL via INTRAMUSCULAR
  Filled 2017-12-03: qty 0.5

## 2017-12-03 NOTE — Progress Notes (Signed)
Post Partum Day #1 Subjective: no complaints, up ad lib and tolerating PO; breast and bottlefeeding; plans DMPA for pp contraception; states R wrist/hand is swollen and painful to move; this began approx 3 wks ago but has gotten worse yesterday  Objective: Blood pressure (!) 85/54, pulse 74, temperature 98.2 F (36.8 C), temperature source Oral, resp. rate 18, height 4\' 10"  (1.473 m), weight 54.4 kg, last menstrual period 03/11/2017, SpO2 99 %, unknown if currently breastfeeding.  Physical Exam:  General: alert, cooperative and no distress Lochia: appropriate Uterine Fundus: firm DVT Evaluation: No evidence of DVT seen on physical exam.  Recent Labs    12/02/17 0159 12/02/17 0604  HGB 12.5 11.8*  HCT 36.8 34.4*    Assessment/Plan: Plan for discharge tomorrow  Rev'd with pt probably some carpal tunnel/nerve compression of R hand. Rec wrist splint- will order. Expect to improve within 1 week as edema of pregnancy begins to improve.   LOS: 1 day   Cam HaiSHAW, Graceanne Guin CNM 12/03/2017, 9:23 AM

## 2017-12-03 NOTE — Progress Notes (Addendum)
MD order for wrist splint. This hospital does not carry them per materials. MD notified; who said he would discuss with midwife

## 2017-12-03 NOTE — Progress Notes (Signed)
Mother of baby was referred for history of depression and anxiety. Referral screened out by CSW because per chart review, MOB's diagnosis originated greater than three years. Per prenatal record, MOB has not been on any psychotropic medications since 2013. MOB reported doing well throughout prenatal visits.   Please contact CSW if mother of baby requests, if needs arise, or if mother of baby scores greater than a nine or answers yes to question ten on [place] Postpartum Depression Screen.   Edwin Dadaarol Aradhya Shellenbarger, MSW, LCSW-A Clinical Social Worker [facility] [phone]

## 2017-12-04 DIAGNOSIS — D509 Iron deficiency anemia, unspecified: Secondary | ICD-10-CM

## 2017-12-04 DIAGNOSIS — Z22330 Carrier of Group B streptococcus: Secondary | ICD-10-CM

## 2017-12-04 DIAGNOSIS — O09529 Supervision of elderly multigravida, unspecified trimester: Secondary | ICD-10-CM

## 2017-12-04 DIAGNOSIS — O99119 Other diseases of the blood and blood-forming organs and certain disorders involving the immune mechanism complicating pregnancy, unspecified trimester: Secondary | ICD-10-CM

## 2017-12-04 DIAGNOSIS — D696 Thrombocytopenia, unspecified: Secondary | ICD-10-CM

## 2017-12-04 DIAGNOSIS — Z3A38 38 weeks gestation of pregnancy: Secondary | ICD-10-CM

## 2017-12-04 HISTORY — DX: Supervision of elderly multigravida, unspecified trimester: O09.529

## 2017-12-04 HISTORY — DX: Iron deficiency anemia, unspecified: D50.9

## 2017-12-04 HISTORY — DX: Thrombocytopenia, unspecified: D69.6

## 2017-12-04 HISTORY — DX: Other diseases of the blood and blood-forming organs and certain disorders involving the immune mechanism complicating pregnancy, unspecified trimester: O99.119

## 2017-12-04 HISTORY — DX: Carrier of group B Streptococcus: Z22.330

## 2017-12-04 MED ORDER — IBUPROFEN 600 MG PO TABS: 600.0000 mg | ORAL_TABLET | Freq: Four times a day (QID) | ORAL | 0 refills | Status: DC

## 2017-12-04 NOTE — Lactation Note (Addendum)
This note was copied from a baby's chart. Lactation Consultation Note Baby 50 hrs. Old Mom states FOB interpreters for her. LC offered to get language line, mom stated no. Mom has been breast/formula feeding baby. FOB asked for formula and mentions to RN mom's breast was getting full of milk and hurting. Discussed w/mom importance of BF, breast massage, pumping, ICE, wearing a bra, milk storage, supply and demand. Noted breast filling, still softens well, has lots of knots. Baby BF to Rt. Breast, LC using hand pump and massaging breast. Tender to touch. Noted improvement. Collected a small amount of thick deep orange/yellow colostrum. Baby gulping at breast. Feeding well. Kept reminding mom to keep baby closer to breast to prevent nipple soreness and help transfer milk better. Support placed under mom's arm helpful.  Asked mom if she had any further questions, stated no.  Baby has heel pricked for elevated bili.   Patient Name: Hannah Edwin DadaMaria Rios Orozco LKGMW'NToday's Date: 12/04/2017 Reason for consult: Follow-up assessment;Early term 37-38.6wks   Maternal Data    Feeding Feeding Type: Breast Fed Length of feed: 20 min  LATCH Score Latch: Grasps breast easily, tongue down, lips flanged, rhythmical sucking.  Audible Swallowing: Spontaneous and intermittent  Type of Nipple: Everted at rest and after stimulation  Comfort (Breast/Nipple): Filling, red/small blisters or bruises, mild/mod discomfort  Hold (Positioning): Assistance needed to correctly position infant at breast and maintain latch.  LATCH Score: 8  Interventions Interventions: Breast feeding basics reviewed;Support pillows;Position options;Breast massage;Hand pump;Breast compression;Adjust position  Lactation Tools Discussed/Used Tools: Pump Breast pump type: Manual Pump Review: Setup, frequency, and cleaning;Milk Storage(RN set up/LC milk storage) Initiated by:: RN Date initiated:: 12/03/17   Consult Status Consult  Status: Complete Date: 12/04/17    [name], Lakashia Collison G 12/04/2017, 5:52 AM

## 2017-12-04 NOTE — Discharge Summary (Signed)
Obstetrics Discharge Summary OB/GYN Faculty Practice   Patient Name: Hannah Orozco DOB: 01/06/1981 MRN: [id number]  Date of admission: 12/01/2017 Delivering MD: Jacklyn ShellRESENZO-DISHMON, FRANCES   Date of discharge: 12/04/2017  Admitting diagnosis: 38WKS CTX,BLEEDING Intrauterine pregnancy: 5082w0d     Secondary diagnosis:   Active Problems:   Normal labor  Additional problems:  . GERD . Breast Fibroadenosis . AMA . Iron-Deficiency Anemia - HgB 11.8 on admission . History of Preeclampsia - BP well-controlled during hospital stay  . Gestational Thrombocytopenia - 124 on admission  . GBS(+)     Discharge diagnosis: Term Pregnancy Delivered                                            Postpartum procedures: None  Complications: none  Hospital course: Hannah Orozco is a 37 y.o. 5682w0d who was admitted for early labor. Her pregnancy was complicated by above noted. Her labor course was notable for epidural placement. Delivery was uncomplicated, no lacerations and minimal QBL 158cc. Please see delivery/op note for additional details. Her postpartum course was uncomplicated. She was breastfeeding without difficulty. By day of discharge, she was passing flatus, urinating, eating and drinking without difficulty. She denies headaches, blurry vision and BP has been well-controlled. Her pain was well-controlled, and she was discharged home with ibuprofen. She will follow-up in clinic in 4-6 weeks.   Physical exam  Vitals:   12/03/17 0640 12/03/17 1451 12/03/17 2138 12/04/17 0611  BP: (!) 85/54 101/64 (!) 100/59 (!) 91/52  Pulse: 74 64 69 71  Resp: 18 18 16 16   Temp: 98.2 F (36.8 C) 99.4 F (37.4 C) 97.9 F (36.6 C) 97.7 F (36.5 C)  TempSrc: Oral Oral Oral Oral  SpO2: 99%  100% 100%  Weight:      Height:       General: well-appearing, NAD, sitting up in bed Lochia: appropriate Uterine Fundus: firm Incision: N/A DVT Evaluation: No significant calf/ankle edema. Labs: Lab Results   Component Value Date   WBC 14.6 (H) 12/02/2017   HGB 11.8 (L) 12/02/2017   HCT 34.4 (L) 12/02/2017   MCV 95.3 12/02/2017   PLT 124 (L) 12/02/2017   CMP Latest Ref Rng & Units 06/03/2016  Glucose 65 - 99 mg/dL 87  BUN 6 - 20 mg/dL 9  Creatinine 4.090.57 - 8.111.00 mg/dL 9.14(N0.48(L)  Sodium 829134 - 562144 mmol/L 141  Potassium 3.5 - 5.2 mmol/L 4.8  Chloride 96 - 106 mmol/L 101  CO2 18 - 29 mmol/L 23  Calcium 8.7 - 10.2 mg/dL 9.4  Total Protein 6.0 - 8.3 g/dL -  Total Bilirubin 0.2 - 1.2 mg/dL -  Alkaline Phos 39 - 130117 U/L -  AST 0 - 37 U/L -  ALT 0 - 35 U/L -    Discharge instructions: Per After Visit Summary and "Baby and Me Booklet"  After visit meds:  Allergies as of 12/04/2017   No Known Allergies     Medication List    TAKE these medications   ferrous sulfate 325 (65 FE) MG tablet Take 1 tablet (325 mg total) by mouth 2 (two) times daily with a meal.   ibuprofen 600 MG tablet Commonly known as:  ADVIL,MOTRIN Take 1 tablet (600 mg total) by mouth every 6 (six) hours.   ondansetron 4 MG tablet Commonly known as:  ZOFRAN Take 1 tablet (4 mg total)  by mouth every 8 (eight) hours as needed for nausea or vomiting.   prenatal multivitamin Tabs tablet Take 1 tablet by mouth daily.   ranitidine 150 MG tablet Commonly known as:  ZANTAC Take 1 tablet (150 mg total) by mouth 2 (two) times daily as needed for heartburn.   vitamin B-6 25 MG tablet Commonly known as:  pyridOXINE Take 1 tablet (25 mg total) by mouth 3 (three) times daily as needed.       Postpartum contraception: Depo Provera at [facility] visit Diet: Routine Diet Activity: Advance as tolerated. Pelvic rest for 6 weeks.   Outpatient follow up:4-6 weeks , message sent to schedule  Follow-up Appt: Future Appointments  Date Time Provider Department Center  12/08/2017  9:30 AM [facility]   Newborn Data: Live born female  Birth Weight: 6 lb 6.8 oz (2914 g) APGAR: 8, 9  Newborn Delivery   Birth  date/time:  12/02/2017 03:31:00 Delivery type:  Vaginal, Spontaneous    Baby Feeding: Breast Disposition:home with mother  Cristal Deer. Earlene Plater, DO OB/GYN Fellow, Faculty Practice

## 2018-01-17 ENCOUNTER — Encounter: Payer: Self-pay | Admitting: Family Medicine

## 2018-01-17 ENCOUNTER — Ambulatory Visit (INDEPENDENT_AMBULATORY_CARE_PROVIDER_SITE_OTHER): Payer: Self-pay | Admitting: Family Medicine

## 2018-01-17 ENCOUNTER — Other Ambulatory Visit: Payer: Self-pay

## 2018-01-17 VITALS — BP 100/58 | HR 68 | Temp 97.9°F | Ht 59.0 in | Wt 111.2 lb

## 2018-01-17 DIAGNOSIS — D508 Other iron deficiency anemias: Secondary | ICD-10-CM

## 2018-01-17 DIAGNOSIS — Z3042 Encounter for surveillance of injectable contraceptive: Secondary | ICD-10-CM

## 2018-01-17 DIAGNOSIS — Z3202 Encounter for pregnancy test, result negative: Secondary | ICD-10-CM

## 2018-01-17 DIAGNOSIS — Z3009 Encounter for other general counseling and advice on contraception: Secondary | ICD-10-CM

## 2018-01-17 LAB — POCT URINE PREGNANCY: Preg Test, Ur: NEGATIVE

## 2018-01-17 MED ORDER — MEDROXYPROGESTERONE ACETATE 150 MG/ML IM SUSY
150.0000 mg | PREFILLED_SYRINGE | Freq: Once | INTRAMUSCULAR | Status: AC
  Administered 2018-01-17: 150 mg via INTRAMUSCULAR

## 2018-01-17 MED ORDER — MEDROXYPROGESTERONE ACETATE 150 MG/ML IM SUSP: 150.0000 mg | Freq: Once | INTRAMUSCULAR | Status: DC

## 2018-01-17 NOTE — Patient Instructions (Signed)
Try warm compressions on your breasts for 10-15 minutes at time, with gentle massage around the breast to help release any milk blockage. You are doing wonderful, keep it up!

## 2018-01-17 NOTE — Progress Notes (Signed)
Subjective:    Hannah Orozco is a 37 y.o. [phone] female who presents for a postpartum visit. She is 6 weeks postpartum following a spontaneous vaginal delivery. I have fully reviewed the prenatal and intrapartum course. The delivery was at [redacted]w[redacted]d gestational weeks. Outcome: spontaneous vaginal delivery. Anesthesia: epidural. Postpartum course has been unremarkable. Baby's course has been unremarkable. Baby is feeding by both breast and bottle - Gerber . Bleeding: no bleeding. Bowel function is normal. Bladder function is normal. Patient is not sexually active. Contraception method is Depo-Provera injections. Postpartum depression screening: negative.  The following portions of the patient's history were reviewed and updated as appropriate: allergies, current medications, and problem list.  Current concern: L breast cramping for the past two weeks, states she has noticed the tip of her nipple occasionally stays white. Can have breast cramping with breastfeeding and occasionally when relaxing, not always associated with when her breast are full. Hasn't noticed any further progression since onset. Denies any fever, chills, breast lumps, erythema, abnormal nipple discharge, or rashes around the breast.   Review of Systems Pertinent items are noted in HPI.   Vitals:   01/17/18 0947  BP: (!) 100/58  Pulse: 68  Temp: 97.9 F (36.6 C)  TempSrc: Oral  SpO2: 98%  Weight: 111 lb 3.2 oz (50.4 kg)  Height: 4\' 11"  (1.499 m)    Objective:   General  Alert, NAD  HEENT NCAT, MMM  Cardiac RRR no m/g/r   Lung Clear bilaterally, no increased WOB   Breast exam No chest deformity, asymmetry, or rash. Normal contours. No nodules, masses palpated. Minimal tenderness around periphery of L breast. Small white discoloration in center of right nipple.    Abdomen Soft, non-tender, nondistended, normoactive BS.   Ext Warm, dry, palpable distal pulses                     Assessment:   Normal postpartum  exam and course 6 wks s/p SVD Depression screening negative  Contraception counseling, desires Depo   Plan:  Depo-Provera injections given today. Urine bHCG negative prior to administration. Discussed return to sexual activity if desired.   Iron deficiency anemia: taking ferrous supplements BID, obtain CBC today.   Blocked milk duct: Patient endorses some breast cramping with "white" in her nipple. Benign breast exam, likely plugged milk duct. Recommended warm compresses 10-15 at a time with gentle massage of each breast to help with any blockage. Discussed return to care precautions (developing fever, rash, or abnormal drainage around breast) or if no improvement in symptoms.    Follow up for annual female well examination or sooner as needed, (depo shot needed in 3 months).   Allayne Stack, DO  Family Medicine PGY-1

## 2018-01-18 ENCOUNTER — Encounter: Payer: Self-pay | Admitting: Family Medicine

## 2018-01-18 LAB — CBC WITH DIFFERENTIAL/PLATELET
BASOS: 1 %
Basophils Absolute: 0 10*3/uL (ref 0.0–0.2)
EOS (ABSOLUTE): 0.1 10*3/uL (ref 0.0–0.4)
Eos: 3 %
Hematocrit: 37.6 % (ref 34.0–46.6)
Hemoglobin: 12.3 g/dL (ref 11.1–15.9)
IMMATURE GRANULOCYTES: 0 %
Immature Grans (Abs): 0 10*3/uL (ref 0.0–0.1)
Lymphocytes Absolute: 1.6 10*3/uL (ref 0.7–3.1)
Lymphs: 28 %
MCH: 30.6 pg (ref 26.6–33.0)
MCHC: 32.7 g/dL (ref 31.5–35.7)
MCV: 94 fL (ref 79–97)
MONOS ABS: 0.4 10*3/uL (ref 0.1–0.9)
Monocytes: 7 %
NEUTROS PCT: 61 %
Neutrophils Absolute: 3.4 10*3/uL (ref 1.4–7.0)
PLATELETS: 156 10*3/uL (ref 150–450)
RBC: 4.02 x10E6/uL (ref 3.77–5.28)
RDW: 12.5 % (ref 12.3–15.4)
WBC: 5.5 10*3/uL (ref 3.4–10.8)

## 2018-03-15 ENCOUNTER — Telehealth (HOSPITAL_COMMUNITY): Payer: Self-pay | Admitting: Emergency Medicine

## 2018-03-15 ENCOUNTER — Encounter (HOSPITAL_COMMUNITY): Payer: Self-pay | Admitting: Emergency Medicine

## 2018-03-15 ENCOUNTER — Ambulatory Visit (HOSPITAL_COMMUNITY)
Admission: EM | Admit: 2018-03-15 | Discharge: 2018-03-15 | Disposition: A | Discharge status: Home / Self Care | Payer: Self-pay | Attending: Family Medicine | Admitting: Family Medicine

## 2018-03-15 ENCOUNTER — Other Ambulatory Visit: Payer: Self-pay

## 2018-03-15 DIAGNOSIS — J020 Streptococcal pharyngitis: Secondary | ICD-10-CM

## 2018-03-15 MED ORDER — PENICILLIN V POTASSIUM 500 MG PO TABS: 500.0000 mg | ORAL_TABLET | Freq: Four times a day (QID) | ORAL | 0 refills | Status: DC

## 2018-03-15 MED ORDER — PENICILLIN V POTASSIUM 500 MG PO TABS: 500.0000 mg | ORAL_TABLET | Freq: Four times a day (QID) | ORAL | 0 refills | Status: AC

## 2018-03-15 NOTE — ED Provider Notes (Signed)
[facility]    CSN: [id number] Arrival date & time: 03/15/18  1747     History   Chief Complaint Chief Complaint  Patient presents with  . Sore Throat    HPI Hannah Orozco Shon Hale is a 38 y.o. female.   Patient has children at home who have tested positive for strep throat and now she is symptomatic x2 days.  She has headache but no real fever by history and she is afebrile here tonight.  HPI  Past Medical History:  Diagnosis Date  . Advanced maternal age in multigravida 12/04/2017  . Anemia   . Anxiety    no meds, doing ok  . Depression    was on antidepressants ~59yrs ago (2013), fine now  . 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 GERD (gastroesophageal reflux disease)   . Gestational thrombocytopenia (HCC) 12/04/2017  . History of pre-eclampsia in prior pregnancy, currently pregnant 04/12/2013  . Iron deficiency anemia 12/04/2017  . Normal labor 12/02/2017  . Supervision of other normal pregnancy, antepartum 03/18/2014   [facility] (Dr. Beverely Low - pager [phone]) Please also call Dr. Levert Feinstein (cell [phone], pager [phone]) for delivery  Genetic Screen  declined  Anatomic [place]  normal female  Glucose Screen  90  GBS    Feeding Preference  Breast and bottle  Contraception  undecided  Circumcision  n/a  Pap  09/2012  Other  size < dates, but [place] at 36w shows estimated weight at 20th %  . Thrombocytopenia [facility])     Patient Active Problem List   Diagnosis Date Noted  . GASTROESOPHAGEAL REFLUX, NO ESOPHAGITIS 05/12/2006  . 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 ESOPHAGUS SURGERY  ~2004   laparoscopic, ? opened up esophagus so she could eat/swallow    OB History    Gravida  3   Para  3   Term  3   Preterm      AB      Living  3     SAB      TAB      Ectopic      Multiple  0   Live Births  3            Home Medications    Prior to Admission medications   Medication Sig  Start Date End Date Taking? Authorizing Provider  ibuprofen (ADVIL,MOTRIN) 200 MG tablet Take 200 mg by mouth every 6 (six) hours as needed.   Yes [provider]  ferrous sulfate 325 (65 FE) MG tablet Take 1 tablet (325 mg total) by mouth 2 (two) times daily with a meal. 09/23/17   Eniola, Theador Hawthorne, MD  ondansetron (ZOFRAN) 4 MG tablet Take 1 tablet (4 mg total) by mouth every 8 (eight) hours as needed for nausea or vomiting. 05/19/17   Tillman Sers, DO  ranitidine (ZANTAC) 150 MG tablet Take 1 tablet (150 mg total) by mouth 2 (two) times daily as needed for heartburn. 05/19/17   Tillman Sers, DO    Family History Family History  Problem Relation Age of Onset  . Hypertension Mother   . Diabetes Mother   . Hearing loss Neg Hx   . Asthma Neg Hx   . Cancer Neg Hx   . Heart disease Neg Hx   . Stroke Neg Hx     Social History Social History   Tobacco Use  . Smoking status: Never Smoker  .  Smokeless tobacco: Never Used  Substance Use Topics  . Alcohol use: No  . Drug use: No     Allergies   Patient has no known allergies.   Review of Systems Review of Systems  Constitutional: Positive for fever.  HENT: Positive for rhinorrhea and sore throat.   Respiratory: Negative.   Cardiovascular: Negative.   Gastrointestinal: Negative.      Physical Exam Triage Vital Signs ED Triage Vitals  Enc Vitals Group     BP 03/15/18 1909 109/60     Pulse Rate 03/15/18 1909 100     Resp 03/15/18 1909 (!) 22     Temp 03/15/18 1909 98.8 F (37.1 C)     Temp Source 03/15/18 1909 Oral     SpO2 03/15/18 1909 100 %     Weight --      Height --      Head Circumference --      Peak Flow --      Pain Score 03/15/18 1906 10     Pain Loc --      Pain Edu? --      Excl. in GC? --    No data found.  Updated Vital Signs BP 109/60 (BP Location: Right Arm)   Pulse 100   Temp 98.8 F (37.1 C) (Oral)   Resp (!) 22   SpO2 100%   Visual Acuity Right Eye Distance:   Left Eye  Distance:   Bilateral Distance:    Right Eye Near:   Left Eye Near:    Bilateral Near:     Physical Exam Constitutional:      Appearance: She is well-developed.  HENT:     Head: Normocephalic.     Right Ear: Tympanic membrane normal.     Left Ear: Tympanic membrane normal.     Mouth/Throat:     Mouth: Mucous membranes are moist.     Pharynx: Posterior oropharyngeal erythema present.  Neck:     Musculoskeletal: Normal range of motion.  Cardiovascular:     Rate and Rhythm: Normal rate and regular rhythm.  Pulmonary:     Effort: Pulmonary effort is normal.     Breath sounds: Normal breath sounds.  Abdominal:     Palpations: Abdomen is soft.  Neurological:     Mental Status: She is alert.      UC Treatments / Results  Labs (all labs ordered are listed, but only abnormal results are displayed) Labs Reviewed - No data to display  EKG None  Radiology No results found.  Procedures Procedures (including critical care time)  Medications Ordered in UC Medications - No data to display  Initial Impression / Assessment and Plan / UC Course  I have reviewed the triage vital signs and the nursing notes.  Pertinent labs & imaging results that were available during my care of the patient were reviewed by me and considered in my medical decision making (see chart for details).    Pharyngitis with strep and home.  Will treat.  Patient is 3 months pregnant but I think penicillin is safe in pregnancy  Final Clinical Impressions(s) / UC Diagnoses   Final diagnoses:  None   Discharge Instructions   None    ED Prescriptions    None     Controlled Substance Prescriptions [place] Controlled Substance Registry consulted? No   Frederica Kuster, MD 03/15/18 Margretta Ditty

## 2018-03-15 NOTE — Telephone Encounter (Signed)
Pharmacy closed. meds sent to open pharmacy

## 2018-03-15 NOTE — ED Triage Notes (Addendum)
Cough, sore throat, onset Monday.  Patient has felt feverish.  Head stuffiness

## 2018-06-07 ENCOUNTER — Telehealth: Payer: Self-pay | Admitting: Family Medicine

## 2018-06-07 ENCOUNTER — Other Ambulatory Visit: Payer: Self-pay | Admitting: Family Medicine

## 2018-06-07 DIAGNOSIS — K219 Gastro-esophageal reflux disease without esophagitis: Secondary | ICD-10-CM

## 2018-06-07 MED ORDER — FAMOTIDINE 20 MG PO TABS: 20.0000 mg | ORAL_TABLET | Freq: Two times a day (BID) | ORAL | 0 refills | Status: DC | PRN

## 2018-06-07 NOTE — Telephone Encounter (Signed)
Call patient with spanish interpreter to offer options for her Friday appointment, scheduled for routine physical.  She would like to cancel this appointment and call at a later date to reschedule, however would like an antacid medication sent to her pharmacy for her GERD. Will send rx in.   Additionally, clarified with patient that she is not pregnant after noting in recent ED visit January 2020 it was documented she was 3 months pregnant (she is actually 6 months postpartum).  Also, discussed with patient that she is behind on her Depo injections (last in November 2019), however she would also like to wait on this given the current climate.  Encouraged her to use backup contraception method if she wishes to be sexually active, she endorsed understanding.  Allayne Stack, DO

## 2018-06-09 ENCOUNTER — Encounter: Payer: Self-pay | Admitting: Family Medicine

## 2018-08-18 ENCOUNTER — Other Ambulatory Visit: Payer: Self-pay

## 2018-08-18 ENCOUNTER — Encounter: Payer: Self-pay | Admitting: Family Medicine

## 2018-08-18 ENCOUNTER — Ambulatory Visit (INDEPENDENT_AMBULATORY_CARE_PROVIDER_SITE_OTHER): Payer: Self-pay | Admitting: Family Medicine

## 2018-08-18 DIAGNOSIS — K219 Gastro-esophageal reflux disease without esophagitis: Secondary | ICD-10-CM

## 2018-08-18 MED ORDER — FAMOTIDINE 20 MG PO TABS: 20.0000 mg | ORAL_TABLET | Freq: Two times a day (BID) | ORAL | 5 refills | Status: DC | PRN

## 2018-08-18 NOTE — Patient Instructions (Signed)
We discussed her acid reflux today.  I have sent in famotidine for you to take for these symptoms.  Additionally try to avoid triggers as you can, you can sleep elevated, and avoid eating 2 to 3 hours prior to sleeping.  Let me know if your symptoms are worsening or not improving with this medication.  I would like you to follow-up in a few months for this.  Additionally you are due for your Pap smear, you can schedule appointment at any time to have this time.

## 2018-08-18 NOTE — Assessment & Plan Note (Signed)
Clinical presentation consistent with mild GERD.  Previous EGD without esophagitis in 2011.  No alarm symptoms. Will send in famotidine twice daily for relief.  Additionally counseled on supportive measures such as avoiding triggers as possible, limiting food intake 2-3 hours prior to sleep, sitting upright after meals. - F/U if symptoms not improving or worsening - Can reassess in 4-6 weeks, can consider transitioning to PRN if improvement vs PPI if continues to be symptomatic

## 2018-08-18 NOTE — Progress Notes (Addendum)
   Subjective:    Patient ID: Hannah Orozco, female    DOB: 05-17-1980, 38 y.o.   MRN: [id number]   CC: GERD  HPI: [name] is a 38 year old female presenting discuss the following:  Dual visit with her son who is been febrile, has a pending COVID test from ED visit.  Initially performed outside, however brought patient with her son into room for son's physical exam.  Acid reflux: Has a history of GERD, with previously taking famotidine however did not have any refills.  Feels her symptoms have come back without it.  States she gets a burning-like sensation in her mid chest after she lays down to go to sleep.  Not every night, every couple of days.  Triggered by spicy foods.  EGD in 2011 without esophagitis or abnormality.  Denies any fever, anorexia, weight loss, abdominal pain, chest pain with physical activity, shortness of breath, nausea, vomiting, or change in bowel movements.   Health maintenance: Overdue for Pap smear.  Smoking status reviewed  Review of Systems Per HPI, also denies recent illness, fever, headache, changes in vision, chest pain, shortness of breath, weakness   Patient Active Problem List   Diagnosis Date Noted  . GASTROESOPHAGEAL REFLUX, NO ESOPHAGITIS 05/12/2006  . FIBROADENOSIS, BREAST 05/12/2006     Objective:  Limited exam performed due to pending COVID with febrile son.  General: NAD, pleasant Cardiac: RRR, normal heart sounds Respiratory: CTAB, normal effort Abdomen: soft, nontender in all 4 quadrants and epigastric region, nondistended, normoactive bowel sounds, no hepatosplenomegaly palpated, no masses palpated Skin: warm and dry Neuro: alert and oriented, normal gait Psych: normal affect  Assessment & Plan:   GASTROESOPHAGEAL REFLUX, NO ESOPHAGITIS Clinical presentation consistent with mild GERD.  Previous EGD without esophagitis in 2011.  No alarm symptoms. Will send in famotidine twice daily for relief.  Additionally counseled on  supportive measures such as avoiding triggers as possible, limiting food intake 2-3 hours prior to sleep, sitting upright after meals. - F/U if symptoms not improving or worsening - Can reassess in 4-6 weeks, can consider transitioning to PRN if improvement vs PPI if continues to be symptomatic   Additional let patient know she is due for Pap smear, can schedule at her convenience.  Leticia Penna, DO Family Medicine Resident PGY-1

## 2018-09-06 ENCOUNTER — Other Ambulatory Visit: Payer: Self-pay | Admitting: Family Medicine

## 2018-09-06 DIAGNOSIS — K219 Gastro-esophageal reflux disease without esophagitis: Secondary | ICD-10-CM

## 2018-09-06 MED ORDER — FAMOTIDINE 20 MG PO TABS: 20.0000 mg | ORAL_TABLET | Freq: Two times a day (BID) | ORAL | 5 refills | Status: DC | PRN

## 2019-02-21 ENCOUNTER — Ambulatory Visit: Payer: Self-pay

## 2019-02-21 ENCOUNTER — Ambulatory Visit: Payer: Self-pay | Admitting: Family Medicine

## 2019-02-22 ENCOUNTER — Ambulatory Visit (INDEPENDENT_AMBULATORY_CARE_PROVIDER_SITE_OTHER): Payer: Self-pay | Admitting: Family Medicine

## 2019-02-22 ENCOUNTER — Other Ambulatory Visit: Payer: Self-pay

## 2019-02-22 VITALS — BP 100/60 | HR 86

## 2019-02-22 DIAGNOSIS — Z3202 Encounter for pregnancy test, result negative: Secondary | ICD-10-CM

## 2019-02-22 LAB — POCT URINE PREGNANCY: Preg Test, Ur: NEGATIVE

## 2019-02-23 DIAGNOSIS — Z3202 Encounter for pregnancy test, result negative: Secondary | ICD-10-CM | POA: Insufficient documentation

## 2019-02-23 NOTE — Progress Notes (Signed)
    Subjective:  Hannah Orozco is a 38 y.o. female who presents to the [facility] today with a chief complaint of pregnancy confirmation.   HPI: Pregnancy test negative Patient says that she has had fairly regular periods since stopping Depo approximately 1 year ago for headaches presents saying that she has had no period for the last 60 days.  She has had no rash, fever, symptoms, injury, significant change in weight.  Her main concern is to verify if she is pregnant or not.  We did discuss that she is overdue for Pap, she has no symptoms but she is currently renewing her orange card and will schedule her Pap after that is done.   Objective:  Physical Exam: BP 100/60   Pulse 86   LMP 11/23/2018   SpO2 98%   Breastfeeding No   Gen: NAD, pleasant and calm CV: Regular rate Pulm: NWOB, no cough GU: Sensitive exam declined MSK: no edema, cyanosis, or clubbing noted Skin: warm, dry Neuro: grossly normal, moves all extremities Psych: Normal affect and thought content  Results for orders placed or performed in visit on 02/22/19 (from the past 72 hour(s))  POCT urine pregnancy     Status: None   Collection Time: 02/22/19  3:26 PM  Result Value Ref Range   Preg Test, Ur Negative Negative     Assessment/Plan:  Pregnancy test negative Patient says that she has had fairly regular periods since stopping Depo approximately 1 year ago for headaches presents saying that she has had no period for the last 60 days.  She has had no rash, fever, symptoms, injury, significant change in weight.  Her main concern is to verify if she is pregnant or not.  Pregnancy test negative, we did discuss contraception at length.  She does not want to be pregnant she is monogamous with her husband and is safe but she says that she does not like the side effect profiles of pills, IUD, Nexplanon, Depo shots.  She said her goal long-term is to get her husband to consider vasectomy, she does not want condoms.  We  discussed that as long she is sexually active with a female and not using birth control that her odds of becoming pregnant are significant but she does not want to start anything at this time.  We did tell her that as her husband is reportedly a patient of this clinic that we would gladly place a referral for vasectomy for him if he called [place] and that he would not even need to come into the clinic to get that referral.   Sherene Sires, [place] - PGY3 02/23/2019 7:53 AM

## 2019-02-23 NOTE — Assessment & Plan Note (Signed)
Patient says that she has had fairly regular periods since stopping Depo approximately 1 year ago for headaches presents saying that she has had no period for the last 60 days.  She has had no rash, fever, symptoms, injury, significant change in weight.  Her main concern is to verify if she is pregnant or not.  Pregnancy test negative, we did discuss contraception at length.  She does not want to be pregnant she is monogamous with her husband and is safe but she says that she does not like the side effect profiles of pills, IUD, Nexplanon, Depo shots.  She said her goal long-term is to get her husband to consider vasectomy, she does not want condoms.  We discussed that as long she is sexually active with a female and not using birth control that her odds of becoming pregnant are significant but she does not want to start anything at this time.  We did tell her that as her husband is reportedly a patient of this clinic that we would gladly place a referral for vasectomy for him if he called [place] and that he would not even need to come into the clinic to get that referral.

## 2019-03-13 ENCOUNTER — Telehealth: Payer: Self-pay | Admitting: Family Medicine

## 2019-03-13 NOTE — Telephone Encounter (Signed)
LVM inofrming patient that her orange card is ready for pick. This has been placed at the front desk. jw

## 2019-04-03 ENCOUNTER — Other Ambulatory Visit: Payer: Self-pay

## 2019-07-20 ENCOUNTER — Ambulatory Visit (INDEPENDENT_AMBULATORY_CARE_PROVIDER_SITE_OTHER): Payer: Self-pay | Admitting: Family Medicine

## 2019-07-20 ENCOUNTER — Other Ambulatory Visit (HOSPITAL_COMMUNITY)
Admission: RE | Admit: 2019-07-20 | Discharge: 2019-07-20 | Disposition: A | Discharge status: Home / Self Care | Payer: Self-pay | Source: Ambulatory Visit | Attending: Family Medicine | Admitting: Family Medicine

## 2019-07-20 ENCOUNTER — Other Ambulatory Visit: Payer: Self-pay

## 2019-07-20 ENCOUNTER — Encounter: Payer: Self-pay | Admitting: Family Medicine

## 2019-07-20 VITALS — BP 98/58 | HR 74 | Ht 59.0 in | Wt 105.8 lb

## 2019-07-20 DIAGNOSIS — L650 Telogen effluvium: Secondary | ICD-10-CM | POA: Insufficient documentation

## 2019-07-20 DIAGNOSIS — Z124 Encounter for screening for malignant neoplasm of cervix: Secondary | ICD-10-CM | POA: Insufficient documentation

## 2019-07-20 DIAGNOSIS — L659 Nonscarring hair loss, unspecified: Secondary | ICD-10-CM

## 2019-07-20 NOTE — Patient Instructions (Signed)
It was a wonderful seeing you today.  We are going to check your blood level and your thyroid to make sure this is not contributing to your hair.  I suspect has been increased stress lately, try to find things of your own to enjoy and give yourself some free time to relax.  Please follow-up with me if your noticing your hair loss worsening or not improving in the next few months.  We also did your Pap smear today, I will let you know these results with the labs within the next week.

## 2019-07-20 NOTE — Progress Notes (Signed)
.     SUBJECTIVE:   CHIEF COMPLAINT / HPI: Pap smear/hair loss   [name] is a 39 year old female presenting to discuss the following:  Pap smear: Last Pap smear was in 09/2012, normal at that time.  No known HPV status.  Monogamous, married.  Hair loss: Reports she noticed hair loss after having her son Hannah Orozco who is now >52 year old, however noticed it again over the past 1-2 months.  Says she is seen more hair in her brush than previous.  Reports it is generalized, denies any abnormalities within her scalp.  No hairless patches, scalp irritation, rash or itching.  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  She has not set much time aside to do things for herself.  Not taking any medications with the exception of antireflux medication.  PERTINENT  PMH / PSH: GERD  OBJECTIVE:   BP (!) 98/58   Pulse 74   Ht 4\' 11"  (1.499 m)   Wt 105 lb 12.8 oz (48 kg)   LMP 07/11/2019 (Approximate)   SpO2 96%   BMI 21.37 kg/m   General: Alert, NAD HEENT: NCAT, MMM, examined entirety of scalp without patches/regions of hair loss.  Few regions with thinner hair at scalp. No rashes or lesions seen. Lungs: No increased WOB  Abdomen: soft, non-tender Msk: Moves all extremities spontaneously  Pelvic exam: VULVA: normal appearing vulva with no masses, tenderness or lesions, VAGINA: normal appearing vagina with normal color and discharge, no lesions, CERVIX: normal appearing cervix without discharge or lesions, UTERUS: uterus is normal size, shape, consistency and nontender, ADNEXA: normal adnexa in size, nontender and no masses.  ASSESSMENT/PLAN:   Screening for cervical cancer Pap smear performed today with HPV cotesting.  Will follow results.  Telogen effluvium Suspect in the setting of increased stressors, however will obtain a TSH and a CBC with ferritin to assess if hypothyroidism/iron deficiency may be contributing.  Reassuringly no abnormalities on scalp to suggest  alopecia or fungal/bacterial infection. Doubt medication side effect, only on Pepcid as needed.  Encouraged following a well-balanced diet, maintaining hydration, and discussed relaxation techniques.  Recommended finding time for herself/hobby and/or exercise as stress relieving activity.     Follow-up if not improving or worsening.  07/13/2019, DO [place] [facility]

## 2019-07-20 NOTE — Assessment & Plan Note (Signed)
Pap smear performed today with HPV cotesting.  Will follow results.

## 2019-07-20 NOTE — Assessment & Plan Note (Signed)
Suspect in the setting of increased stressors, however will obtain a TSH and a CBC with ferritin to assess if hypothyroidism/iron deficiency may be contributing.  Reassuringly no abnormalities on scalp to suggest alopecia or fungal/bacterial infection. Doubt medication side effect, only on Pepcid as needed.  Encouraged following a well-balanced diet, maintaining hydration, and discussed relaxation techniques.  Recommended finding time for herself/hobby and/or exercise as stress relieving activity.

## 2019-07-21 LAB — CBC
Hematocrit: 39.2 % (ref 34.0–46.6)
Hemoglobin: 12.7 g/dL (ref 11.1–15.9)
MCH: 30.4 pg (ref 26.6–33.0)
MCHC: 32.4 g/dL (ref 31.5–35.7)
MCV: 94 fL (ref 79–97)
Platelets: 169 10*3/uL (ref 150–450)
RBC: 4.18 x10E6/uL (ref 3.77–5.28)
RDW: 13.7 % (ref 11.7–15.4)
WBC: 5.5 10*3/uL (ref 3.4–10.8)

## 2019-07-21 LAB — FERRITIN: Ferritin: 31 ng/mL (ref 15–150)

## 2019-07-21 LAB — TSH: TSH: 6.96 u[IU]/mL — ABNORMAL HIGH (ref 0.450–4.500)

## 2019-07-23 ENCOUNTER — Other Ambulatory Visit: Payer: Self-pay | Admitting: Family Medicine

## 2019-07-23 DIAGNOSIS — R7989 Other specified abnormal findings of blood chemistry: Secondary | ICD-10-CM

## 2019-07-23 LAB — CYTOLOGY - PAP
Comment: NEGATIVE
Diagnosis: NEGATIVE
High risk HPV: NEGATIVE

## 2019-07-24 IMAGING — US US OB < 14 WEEKS - US OB TV
1 series · 15 of 28 positions shown · non-contrast
Comparison: None.

CLINICAL DATA: Pregnant, abdominal pain

EXAM:
OBSTETRIC <14 WK US AND TRANSVAGINAL OB US
TECHNIQUE: Both transabdominal and transvaginal ultrasound examinations were
performed for complete evaluation of the gestation as well as the
maternal uterus, adnexal regions, and pelvic cul-de-sac.
Transvaginal technique was performed to assess early pregnancy.

[Series 1: us ob < 14 weeks - us ob tv · 76 acquisitions, 15 frames shown]
[im 1/76]
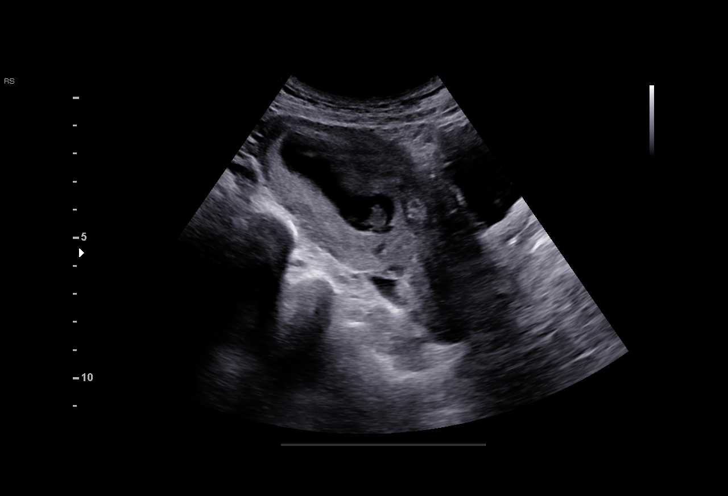
[im 6/76]
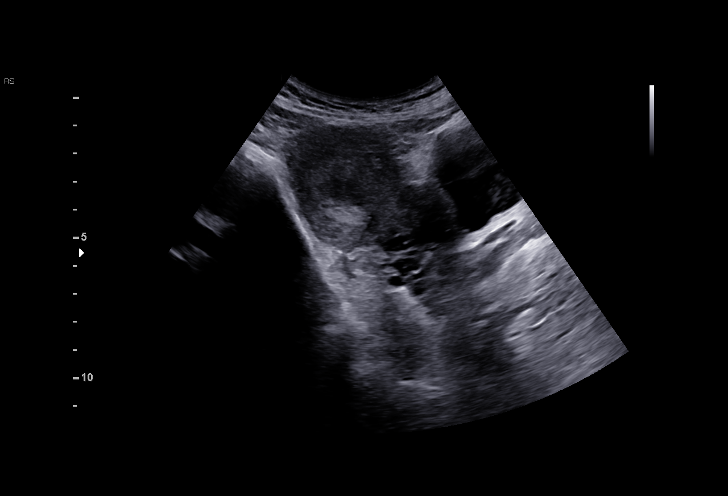
[im 12/76]
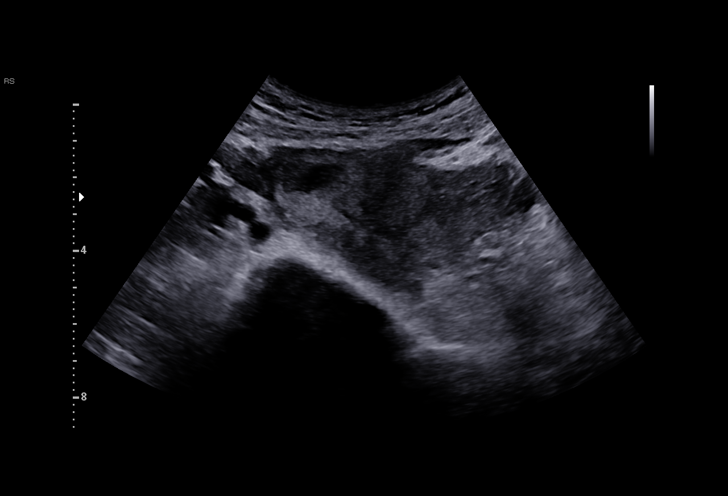
[im 17/76]
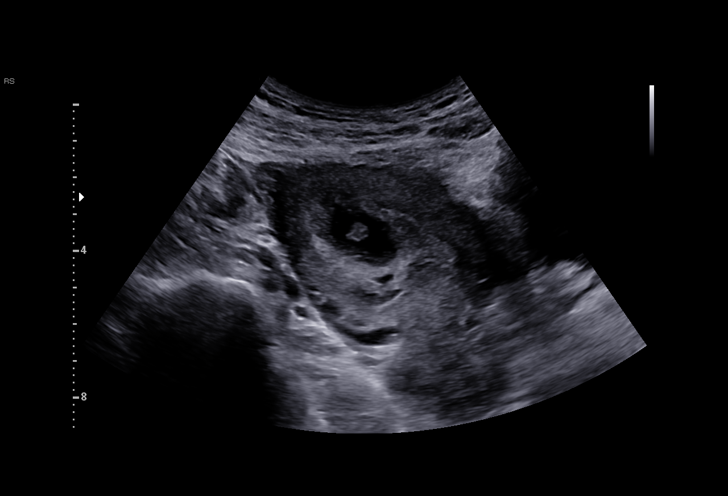
[im 23/76]
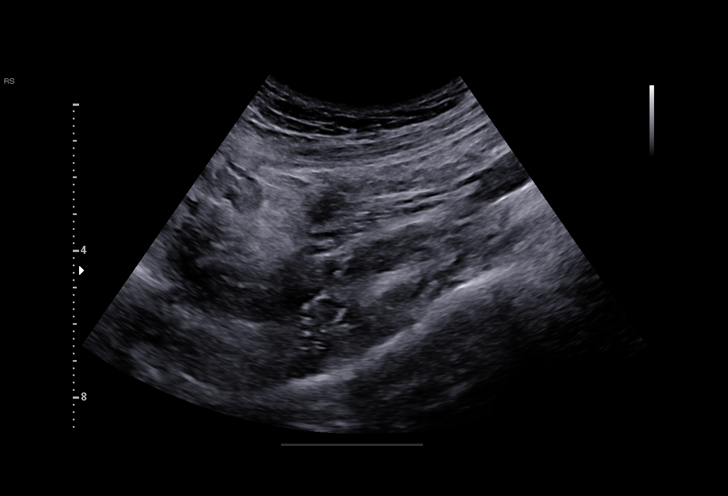
[im 28/76]
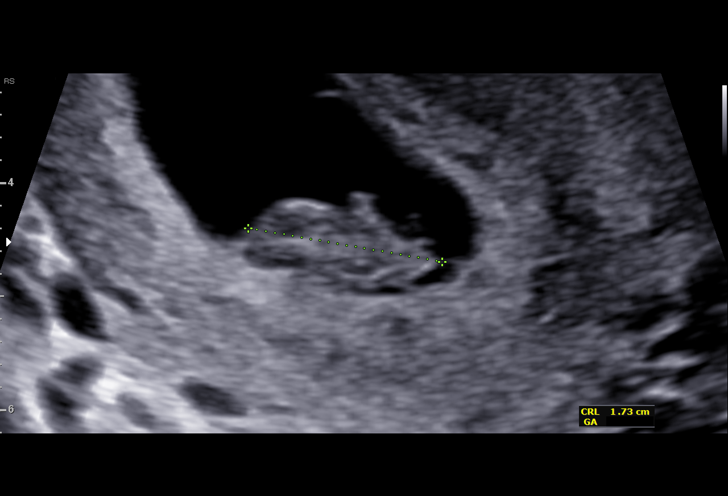
[im 34/76]
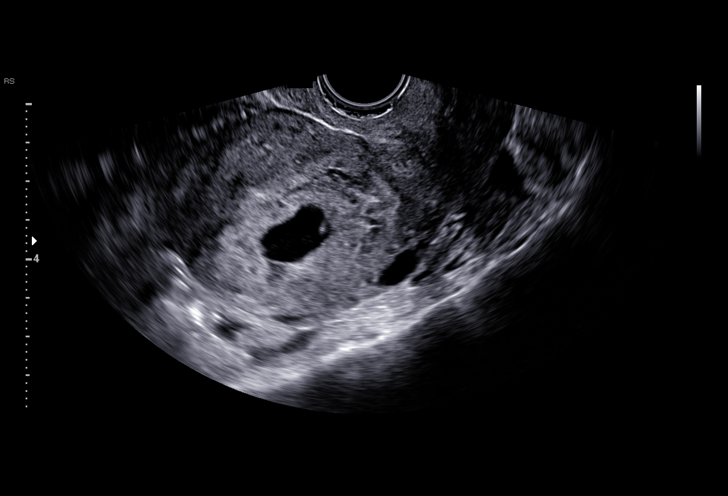
[im 39/76]
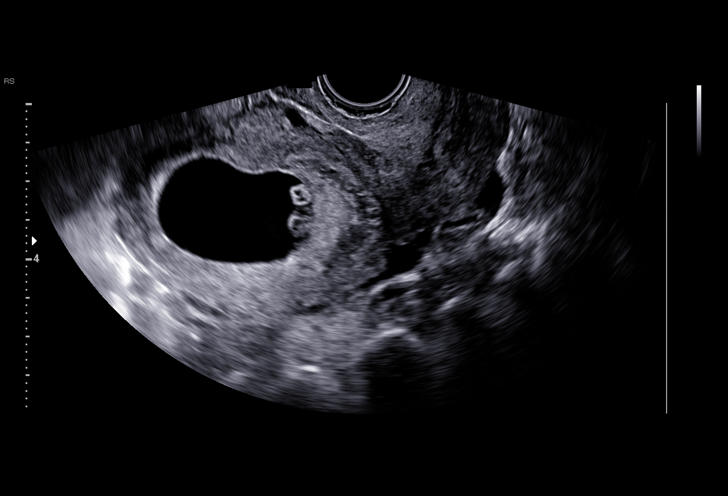
[im 42/76]
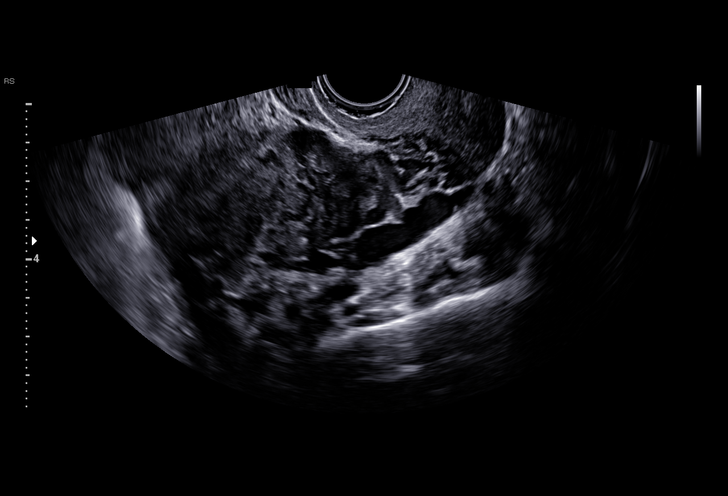
[im 48/76]
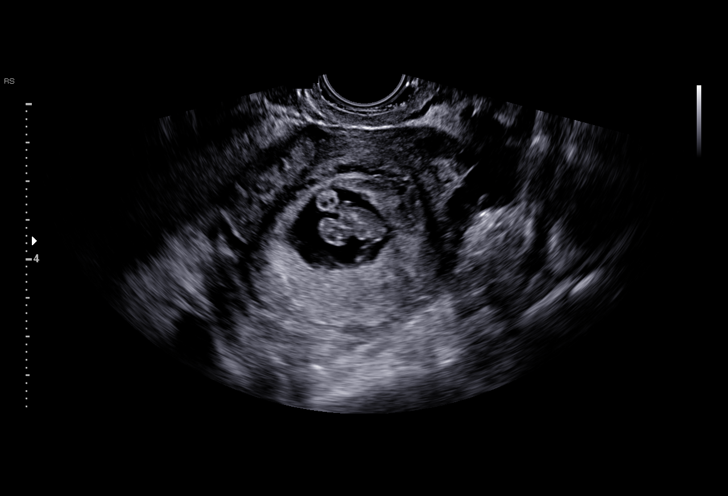
[im 53/76]
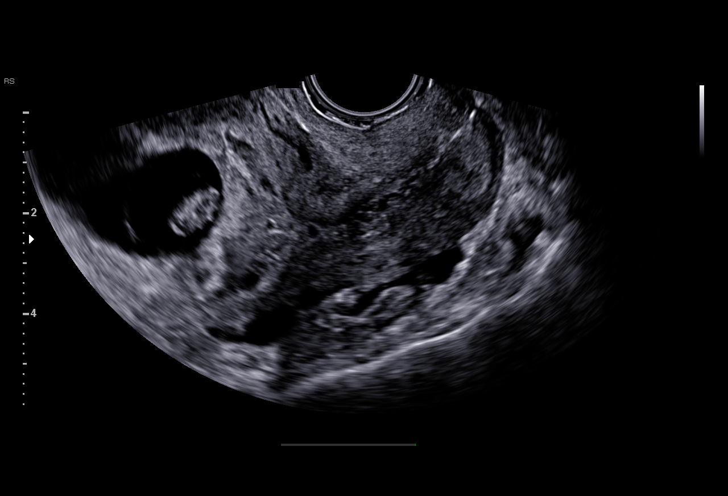
[im 59/76]
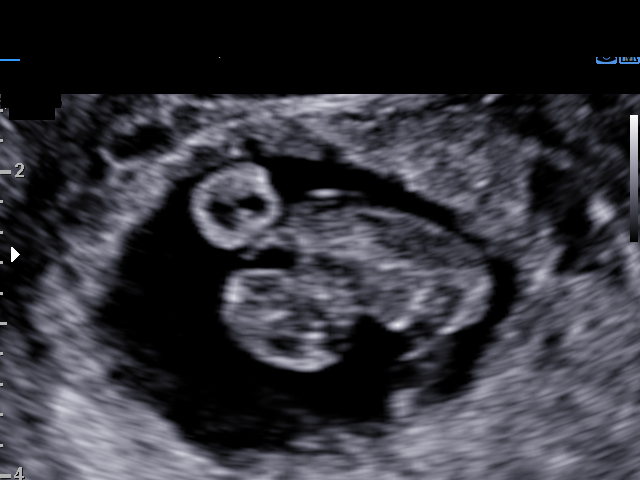
[im 64/76]
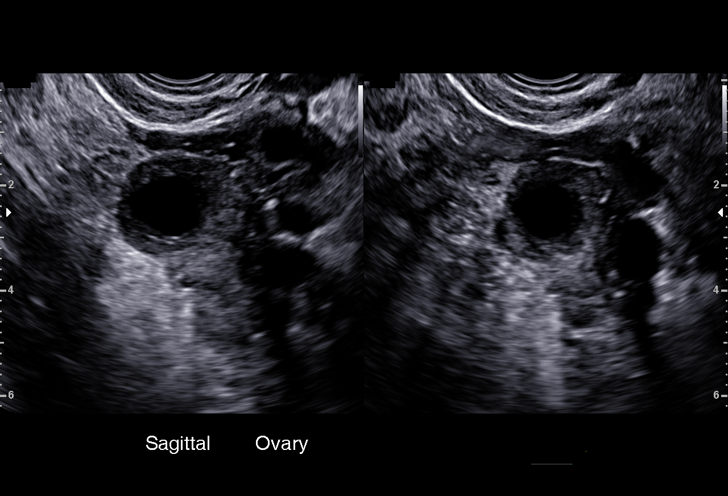
[im 70/76]
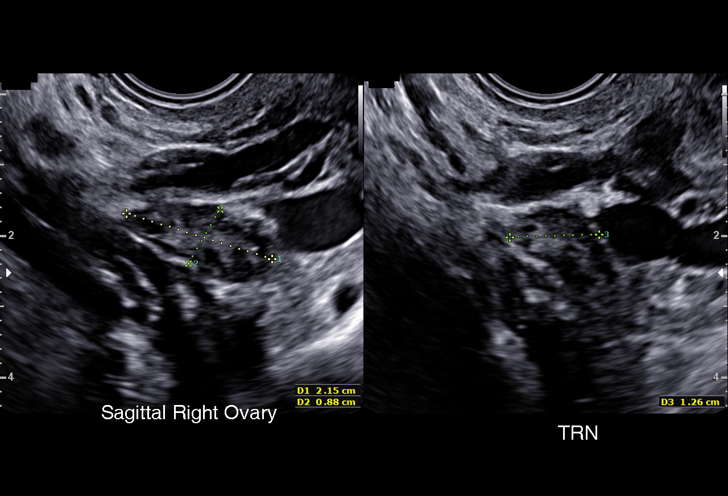
[im 76/76]
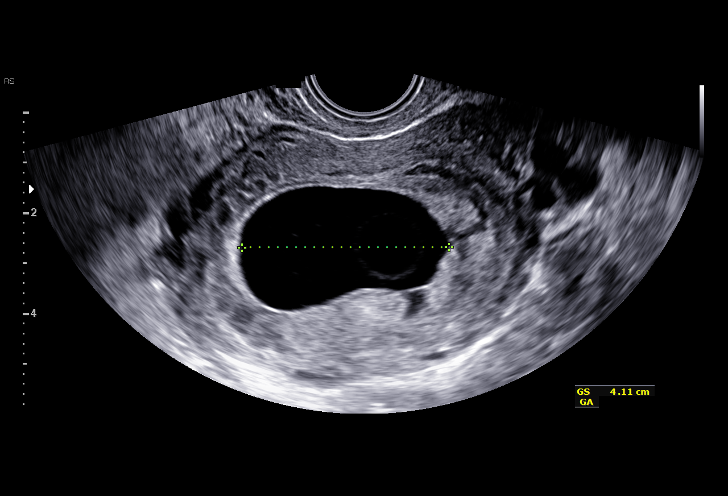

[15 of 28 positions shown; findings below may reference images not displayed]

FINDINGS: Intrauterine gestational sac: Single

Yolk sac:  Not Visualized.

Embryo:  Visualized.

Cardiac Activity: Visualized.

Heart Rate: 170 bpm

CRL:  18.3 mm   8 w   2 d                  US EDC: 12/14/2017

Subchorionic hemorrhage:  Small subchronic hemorrhage.

Maternal uterus/adnexae: Bilateral ovaries are within normal limits,
noting a left probable corpus luteal cyst.

No free fluid.
IMPRESSION: Single live intrauterine gestation, with estimated gestational age 8
weeks 2 days by crown-rump length.

## 2019-07-27 LAB — T4, FREE: Free T4: 1.27 ng/dL (ref 0.82–1.77)

## 2019-07-27 LAB — SPECIMEN STATUS REPORT

## 2019-09-19 ENCOUNTER — Other Ambulatory Visit: Payer: Self-pay | Admitting: Family Medicine

## 2019-09-19 DIAGNOSIS — K219 Gastro-esophageal reflux disease without esophagitis: Secondary | ICD-10-CM

## 2019-09-24 ENCOUNTER — Other Ambulatory Visit: Payer: Self-pay | Admitting: Family Medicine

## 2019-09-24 DIAGNOSIS — K219 Gastro-esophageal reflux disease without esophagitis: Secondary | ICD-10-CM

## 2019-09-24 MED ORDER — FAMOTIDINE 20 MG PO TABS: ORAL_TABLET | ORAL | 4 refills | Status: AC

## 2020-03-26 ENCOUNTER — Ambulatory Visit: Payer: Self-pay

## 2020-08-29 ENCOUNTER — Emergency Department (HOSPITAL_COMMUNITY)
Admission: EM | Admit: 2020-08-29 | Discharge: 2020-08-30 | Disposition: A | Discharge status: Home / Self Care | Payer: Self-pay | Attending: Emergency Medicine | Admitting: Emergency Medicine

## 2020-08-29 ENCOUNTER — Encounter (HOSPITAL_COMMUNITY): Payer: Self-pay | Admitting: *Deleted

## 2020-08-29 ENCOUNTER — Emergency Department (HOSPITAL_COMMUNITY): Payer: Self-pay

## 2020-08-29 ENCOUNTER — Other Ambulatory Visit: Payer: Self-pay

## 2020-08-29 DIAGNOSIS — Y990 Civilian activity done for income or pay: Secondary | ICD-10-CM | POA: Insufficient documentation

## 2020-08-29 DIAGNOSIS — Y9301 Activity, walking, marching and hiking: Secondary | ICD-10-CM | POA: Insufficient documentation

## 2020-08-29 DIAGNOSIS — S60221A Contusion of right hand, initial encounter: Secondary | ICD-10-CM | POA: Insufficient documentation

## 2020-08-29 DIAGNOSIS — W228XXA Striking against or struck by other objects, initial encounter: Secondary | ICD-10-CM | POA: Insufficient documentation

## 2020-08-29 NOTE — ED Triage Notes (Signed)
The plmp 3 months ago  not pregnantt struck her rt forearm at work earlier today  c/o pain

## 2020-08-29 NOTE — ED Provider Notes (Signed)
Emergency Medicine Provider Triage Evaluation Note  Hannah Orozco , a 40 y.o. female  was evaluated in triage.  Pt comppains of pain in her right hand.  She states that she was at work earlier today and struck her hand.  The pain is primarily in the hand, does radiate up the arm.  No other injuries.  No numbness..  Review of Systems  Positive: Right-hand-dominant, pain in right hand Negative: Numbness  Physical Exam  BP 117/81 (BP Location: Left Arm)   Pulse 64   Temp 98.5 F (36.9 C) (Oral)   Resp 16   Ht 4\' 11"  (1.499 m)   Wt 48 kg   LMP  (LMP Unknown)   SpO2 100%   BMI 21.37 kg/m  Gen:   Awake, no distress   Resp:  Normal effort  MSK:   Moves extremities without difficulty  Other:  Mild edema over the dorsum of the right hand.  Able to move fingers on right hand.  Compartments in right forearm are soft and easily compressible.   Medical Decision Making  Medically screening exam initiated at 9:46 PM.  Appropriate orders placed.  Hannah Orozco was informed that the remainder of the evaluation will be completed by another provider, this initial triage assessment does not replace that evaluation, and the importance of remaining in the ED until their evaluation is complete.     Shon Hale 08/29/20 2147    2148, MD 08/29/20 2232

## 2020-08-30 MED ORDER — IBUPROFEN 600 MG PO TABS: 600.0000 mg | ORAL_TABLET | Freq: Four times a day (QID) | ORAL | 0 refills | Status: AC | PRN

## 2020-08-30 NOTE — ED Provider Notes (Signed)
Emergency Department Provider Note   I have reviewed the triage vital signs and the nursing notes.   HISTORY  Chief Complaint Arm Injury  Phone Spanish interpreter used for this encounter.   HPI Hannah Orozco is a 40 y.o. female with past medical history reviewed below presents to the emergency department for evaluation of swelling and pain to the right hand.  Pain radiates into the forearm to the shoulder.  Patient states she was walking up steps when she hit her hand against a handrail.  She did not fall.  There was no head injury or trauma.  She is not having neck pain.  No numbness or weakness in the hand.  No other areas of pain or injury.   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 Beverely Low - pager [phone]) Please also call Dr. 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 opened up esophagus so she could eat/swallow    Allergies Patient has no known allergies.  Family History  Problem Relation Age of Onset   Hypertension Mother    Diabetes Mother    Hearing loss Neg Hx    Asthma Neg Hx    Cancer Neg Hx    Heart disease Neg Hx    Stroke Neg Hx     Social History Social History   Tobacco Use   Smoking status: Never   Smokeless tobacco: Never  Substance Use Topics   Alcohol use: No   Drug use: No    Review of Systems  Constitutional: No fever/chills Cardiovascular: Denies chest pain. Respiratory: Denies shortness of breath. Gastrointestinal: No abdominal pain.  Musculoskeletal: Negative for back pain. Positive right hand pain/swelling.  Skin: Negative for rash. Neurological: Negative for headaches, focal weakness or   ____________________________________________   PHYSICAL EXAM:  VITAL SIGNS: ED Triage Vitals  Enc Vitals Group     BP 08/29/20 2017 117/81     Pulse Rate 08/29/20 2017 64     Resp 08/29/20 2017 16     Temp 08/29/20 2017 98.5 F (36.9 C)     Temp Source 08/29/20 2017 Oral     SpO2 08/29/20 2017 100 %     Weight 08/29/20 2140 105 lb 13.1 oz (48 kg)  Height 08/29/20 2140 4\' 11"  (1.499 m)   Constitutional: Alert and oriented. Well appearing and in no acute distress. Eyes: Conjunctivae are normal.  Head: Atraumatic. Nose: No congestion/rhinnorhea. Mouth/Throat: Mucous membranes are moist. Neck: No stridor.   Cardiovascular: Good peripheral circulation. Respiratory: Normal respiratory effort.  Gastrointestinal: No distention.  Musculoskeletal: Mild swelling over the dorsum of the right hand.  Normal range of motion of the fingers and wrist.  No focal tenderness to the wrist or scaphoid area.  Normal range of motion of the right shoulder and elbow without discomfort.  No bruising or laceration.  No cellulitis. Neurologic:  Normal speech and language. Normal  sensation in the hand.  Skin:  Skin is warm, dry and intact. No rash noted.  ____________________________________________  RADIOLOGY  DG Hand Complete Right  Result Date: 08/29/2020 CLINICAL DATA:  Right hand pain, injury EXAM: RIGHT HAND - COMPLETE 3+ VIEW COMPARISON:  None. FINDINGS: Frontal, oblique, and lateral views of the right hand are obtained. No fracture, subluxation, or dislocation. Joint spaces are well preserved. Soft tissues are normal. IMPRESSION: 1. Unremarkable right hand. Electronically Signed   By: 08/31/2020 M.D.   On: 08/29/2020 22:08    ____________________________________________   PROCEDURES  Procedure(s) performed:   Procedures  None  ____________________________________________   INITIAL IMPRESSION / ASSESSMENT AND PLAN / ED COURSE  Pertinent labs & imaging results that were available during my care of the patient were reviewed by me and considered in my medical decision making (see chart for details).   Patient presents to the emergency department with hand pain after hitting it against a railing.  She has a focal area of swelling but preserved range of motion.  There is no significant bruising at this time although this may develop.  No cellulitis or abscess concern.  No pain or tenderness to other joints.  No radicular pain.  No head injury.  Plain films reviewed with no acute fracture seen.  Plan for ibuprofen, cool compress, PCP follow-up.  Contact information provided at discharge.   ____________________________________________  FINAL CLINICAL IMPRESSION(S) / ED DIAGNOSES  Final diagnoses:  Contusion of right hand, initial encounter     NEW OUTPATIENT MEDICATIONS STARTED DURING THIS VISIT:  New Prescriptions   IBUPROFEN (ADVIL) 600 MG TABLET    Take 1 tablet (600 mg total) by mouth every 6 (six) hours as needed.    Note:  This document was prepared using Dragon voice recognition software and may include unintentional dictation  errors.  08/31/2020, MD, [facility] Emergency Medicine    Andru Genter, [facility], MD 08/30/20 [phone]

## 2020-08-30 NOTE — ED Notes (Signed)
Patient discharge instructions reviewed with the patient. The patient verbalized understanding. Patient discharged. 

## 2020-12-08 ENCOUNTER — Other Ambulatory Visit: Payer: Self-pay | Admitting: Family Medicine

## 2020-12-08 DIAGNOSIS — Z658 Other specified problems related to psychosocial circumstances: Secondary | ICD-10-CM

## 2020-12-08 NOTE — Progress Notes (Signed)
I saw this patient's son today for a well child check and she expressed interest in therapy due to increased stressors including caring for her son with a developmental delay.  Marylu Lund with [facility] has been involved and discussed with Dr. Manson Passey referral to CCM to help with therapy resources.  Patient agreeable to referral.

## 2020-12-09 ENCOUNTER — Telehealth: Payer: Self-pay | Admitting: *Deleted

## 2020-12-09 NOTE — Chronic Care Management (AMB) (Signed)
  Care Management   Note  12/09/2020 Name: Carlos Quackenbush MRN: [id number] DOB: [birth year]  [name] Chestine Belknap is a 39 y.o. year old female who is a primary care patient of [name], DO. I reached out to [employer] by phone today using [employer] [phone] named Reyne Dumas in response to a referral sent by Ms. Lolita Cram Leon's health plan.    Ms. Keishawn Darsey was given information about care management services today including:  Care management services include personalized support from designated clinical staff supervised by her physician, including individualized plan of care and coordination with other care providers 24/7 contact phone numbers for assistance for urgent and routine care needs. The patient may stop care management services at any time by phone call to the office staff.  Patient agreed to services and verbal consent obtained.   Follow up plan: Telephone appointment with care management team member scheduled for:12/18/2020  [facility] Guide, Embedded Care Coordination [facility]  Care Management  Direct Dial: [phone]

## 2020-12-11 ENCOUNTER — Telehealth: Payer: Self-pay | Admitting: *Deleted

## 2020-12-11 NOTE — Chronic Care Management (AMB) (Signed)
  Care Management   Note  12/11/2020 Name: Alynna Hargrove MRN: [id number] DOB: May 10, 1980  [name] Maille Halliwell is a 40 y.o. year old female who is a primary care patient of [name], DO and is actively engaged with the care management team. I reached out to Sheliah Mends by phone today to assist with scheduling an initial visit with the Licensed Clinical Social Worker  Follow up plan: Telephone appointment with care management team member scheduled for:12/18/2020 Confirmed appointment and reviewed CCM services for patient.   Gwenevere Ghazi  Care Guide, Embedded Care Coordination [facility] Management  Direct Dial: [phone]

## 2020-12-18 ENCOUNTER — Ambulatory Visit: Payer: Self-pay | Admitting: Licensed Clinical Social Worker

## 2020-12-18 DIAGNOSIS — Z7189 Other specified counseling: Secondary | ICD-10-CM

## 2020-12-18 NOTE — Patient Instructions (Signed)
Licensed Clinical Social Worker Visit Information  Goals we discussed today:   Goals Addressed             This Visit's Progress    Begin and Stick with Counseling-Depression       Timeframe:  Short-Term Goal Priority:  High Start Date:    12/18/2020                         Expected End Date:                       Follow Up Date 01/08/2021     Patient Goals/Self-Care Activities: Call Department of Social Services [phone] to apply for food stamps I have placed a referral with [facility] [phone] they will call to schedule your appointment   Why is this important?   Beating depression may take some time.  If you don't feel better right away, don't give up on your treatment plan.         Ms. Ilo Beamon was given information about Care Management services today including:  Care Management services include personalized support from designated clinical staff supervised by her physician, including individualized plan of care and coordination with other care providers 24/7 contact phone numbers for assistance for urgent and routine care needs. The patient may stop Care Management services at any time by phone call to the office staff.   Patient agreed to services and verbal consent obtained.   Patient verbalizes understanding of instructions provided today and agrees to view in MyChart.   Follow up plan: Appointment scheduled for SW follow up with client by phone on: 01/08/2021  [name], LCSW Care Management & Coordination  [facility] Family Medicine / [employer]   [phone]

## 2020-12-18 NOTE — Chronic Care Management (AMB) (Signed)
Care Management Clinical Social Work Note  12/18/2020 Name: Hannah Orozco MRN: [id number] DOB: 1980-11-14  Hannah Orozco Hannah Orozco is a 40 y.o. year old female who is a primary care patient of Hannah Collum, DO.  The Care Management team was consulted for assistance with chronic disease management and coordination needs.  Engaged with patient by telephone for initial visit in response to provider referral for social work chronic care management and care coordination services  Consent to Services:  [name] was given information about Care Management services today including:  Care Management services includes personalized support from designated clinical staff supervised by her physician, including individualized plan of care and coordination with other care providers 24/7 contact phone numbers for assistance for urgent and routine care needs. The patient may stop case management services at any time by phone call to the office staff. Patient agreed to services and consent obtained.   Interpreter:Yes.   ; Name: Hannah Orozco , # [phone] and Language: Spanish  Assessment: Assessed patient's previous and current treatment, coping skills, support system and barriers to care. Patient is currently experiencing symptoms of  stress which seems to be exacerbated by caring for her son..  See Care Plan below for interventions and patient self-care actives.  Follow up Plan: Patient would like continued follow-up from CCM LCSW .  per patient's request will follow up in 3 weeks.  Will call office if needed prior to next encounter.   Review of patient past medical history, allergies, medications, and health status, including review of relevant consultants reports was performed today as part of a comprehensive evaluation and provision of chronic care management and care coordination services.  SDOH (Social Determinants of Health) assessments and interventions performed:  SDOH Interventions    Flowsheet Row  Most Recent Value  SDOH Interventions   Food Insecurity Interventions Other (Comment)  [referral to DSS]  Stress Interventions Provide Counseling, Other (Comment)  [referral for counseling]        Advanced Directives Status: Not addressed in this encounter.  Care Plan  No Known Allergies  Outpatient Encounter Medications as of 12/18/2020  Medication Sig   famotidine (PEPCID) 20 MG tablet TAKE 1 TABLET BY MOUTH TWICE DAILY AS NEEDED FOR HEARTBURN OR INDIGESTION   ferrous sulfate 325 (65 FE) MG tablet Take 1 tablet (325 mg total) by mouth 2 (two) times daily with a meal.   ibuprofen (ADVIL) 600 MG tablet Take 1 tablet (600 mg total) by mouth every 6 (six) hours as needed.   ondansetron (ZOFRAN) 4 MG tablet Take 1 tablet (4 mg total) by mouth every 8 (eight) hours as needed for nausea or vomiting.   ranitidine (ZANTAC) 150 MG tablet Take 1 tablet (150 mg total) by mouth 2 (two) times daily as needed for heartburn.   No facility-administered encounter medications on file as of 12/18/2020.    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   Interventions: Inter-disciplinary care team collaboration (see longitudinal plan of care) Evaluation of current treatment plan  related to  self management and patient's adherence to plan as established by provider  Mental Health:  (Status: New goal.) Evaluation of current treatment plan related to Caregiver Stress Collaboration with [facility] in order to make referral Solution-Focused Strategies employed: reviewed therapy options based on need and no insurance patient requested Spanish Speaking therapist Participation in counseling encouraged  Made referral to [employer] and provided walk in information for [employer] of the [place]  Patient Goals/Self-Care Activities: Call Department of Social Services [phone] to apply for food stamps I have placed a referral with [facility] [phone] they will call to schedule your appointment     Hannah Hines, LCSW Care Management & Coordination  [facility] Family Medicine / [employer]   [phone]

## 2021-01-08 ENCOUNTER — Ambulatory Visit: Payer: Self-pay | Admitting: Licensed Clinical Social Worker

## 2021-01-08 DIAGNOSIS — Z7189 Other specified counseling: Secondary | ICD-10-CM

## 2021-01-08 NOTE — Patient Instructions (Signed)
Visit Information   Goals Addressed             This Visit's Progress    Begin and Stick with Counseling-Depression       Timeframe:  Short-Term Goal Priority:  High Start Date:    12/18/2020                         Expected End Date:                        Patient Goals/Self-Care Activities: Call Department of Social Services [phone] to apply for food stamps I have placed a referral with [facility] [phone] they will call to schedule your appointment You have decided to do a walk-in appointment at [facility] of the [place]       It was a pleasure speaking with you today. No follow up scheduled, per our conversation you do not require or desire continued follow up Per our conversation I will remain part of your care team for the next 30 days.  If no needs are identified in the next 30 days, I will disconnect from the care team. Patient verbalized understanding of instructions, educational materials, and care plan provided today and declined offer to receive copy of patient instructions, educational materials, and care plan.  [name], LCSW Care Management & Coordination  [phone]

## 2021-01-08 NOTE — Chronic Care Management (AMB) (Signed)
Care Management  Clinical Social Work Note  01/08/2021 Name: Hannah Orozco MRN: [id number] DOB: 1980-09-10  Hannah Orozco Hannah Orozco is a 40 y.o. year old female who is a primary care patient of [name], DO. The CCM team was consulted for assistance with care coordination needs. Mental Health Counseling and Resources   Consent to Services:  The patient was given information about Care Management services, agreed to services, and gave verbal consent prior to initiation of services.  Please see initial visit note for detailed documentation.   Patient agreed to services today and consent obtained.  Engaged with patient by telephone for follow up visit in response to provider referral for social work care coordination services.   Interpreter:Yes.   ; Name: Clarita Leber [id number] and Language: Spanish  Assessment/Interventions: Assessed patient's current treatment, progress, coping skills, support system and barriers to care.  Patient has not been able to connect with counseling via referral placed with [facility], nor has she applied for foodstamps . See Care Plan below for interventions and patient self-care actives.  Recent life changes or stressors: caring for son  Recommendation: Patient may benefit from, and is in agreement to go walk-in appointment for therapy with [facility] of the [place]. All information provided.   Follow up Plan:  Patient does not require or desire continued follow-up by CCM LCSW. Will contact the office if needed Patient may benefit from and is in agreement for CCM LCSW to remain part of care team for the next 30 days.  If no needs are identified in the next 30 days, CCM LCSW will disconnect from the care team.   Review of patient past medical history, allergies, medications, and health status, including review of pertinent consultant reports was performed as part of comprehensive evaluation and provision of care management/care  coordination services.   SDOH (Social Determinants of Health) screening and interventions performed today:   Advanced Directives Status:Not addressed in this encounter.     Care Plan    Conditions to be addressed/monitored per PCP order: ,  Caregiver Stress  Care Plan : General Social Work (Adult)  Updates made by Soundra Pilon, LCSW since 01/08/2021 12:00 AM     Problem: Caregiver Stress      Goal: Caregiver Coping Optimized with therapy   Start Date: 12/18/2020  This Visit's Progress: Not on track  Recent Progress: On track  Priority: High  Note:   Current Barriers:  [facility] has not contacted patient for therapy  Language Barrier No health insurance   CSW Clinical Goal(s):  Patient  will connect for therapy through collaboration with Clinical Child psychotherapist, provider, and care team.   Interventions: Inter-disciplinary care team collaboration (see longitudinal plan of care) Evaluation of current treatment plan related to  self management and patient's adherence to plan as established by provider  Mental Health:  (Status: Patient declined further engagement on this goal.) Evaluation of current treatment plan related to Caregiver Stress Collaboration with [facility] in order to make referral Solution-Focused Strategies employed: reviewed therapy options based on need and no insurance patient requested Spanish Speaking therapist Participation in counseling encouraged  Made referral to [employer] and provided walk in information for [employer] of the [employer] with [employer] for barriers of connecting with patient.  Patient Goals/Self-Care Activities: Call Department of Social Services [phone] to apply for food stamps I have placed a referral with [facility]  [phone] they will call to schedule your appointment You have decided to do a walk-in appointment at [facility] of  the [facility] [place] Care Management & Coordination  [facility] / [employer]   [phone]

## 2021-06-24 ENCOUNTER — Emergency Department (HOSPITAL_COMMUNITY)
Admission: EM | Admit: 2021-06-24 | Discharge: 2021-06-25 | Disposition: A | Discharge status: Home / Self Care | Payer: Self-pay | Attending: Emergency Medicine | Admitting: Emergency Medicine

## 2021-06-24 DIAGNOSIS — R1032 Left lower quadrant pain: Secondary | ICD-10-CM | POA: Insufficient documentation

## 2021-06-24 LAB — I-STAT BETA HCG BLOOD, ED (MC, WL, AP ONLY): I-stat hCG, quantitative: <5 m[IU]/mL (ref <5)

## 2021-06-24 LAB — COMPREHENSIVE METABOLIC PANEL
ALT: 15 U/L (ref 0–44)
AST: 18 U/L (ref 15–41)
Albumin: 3.9 g/dL (ref 3.5–5.0)
Alkaline Phosphatase: 60 U/L (ref 38–126)
Anion gap: 4 — ABNORMAL LOW (ref 5–15)
BUN: 9 mg/dL (ref 6–20)
CO2: 28 mmol/L (ref 22–32)
Calcium: 9 mg/dL (ref 8.9–10.3)
Chloride: 106 mmol/L (ref 98–111)
Creatinine, Ser: 0.49 mg/dL (ref 0.44–1.00)
GFR, Estimated: >60 mL/min (ref >60)
Glucose, Bld: 82 mg/dL (ref 70–99)
Potassium: 3.9 mmol/L (ref 3.5–5.1)
Sodium: 138 mmol/L (ref 135–145)
Total Bilirubin: 0.6 mg/dL (ref 0.3–1.2)
Total Protein: 7.2 g/dL (ref 6.5–8.1)

## 2021-06-24 LAB — URINALYSIS, ROUTINE W REFLEX MICROSCOPIC
Bilirubin Urine: NEGATIVE
Glucose, UA: NEGATIVE mg/dL
Hgb urine dipstick: NEGATIVE
Ketones, ur: NEGATIVE mg/dL
Leukocytes,Ua: NEGATIVE
Nitrite: NEGATIVE
Protein, ur: NEGATIVE mg/dL
Specific Gravity, Urine: 1.017 (ref 1.005–1.030)
pH: 8 (ref 5.0–8.0)

## 2021-06-24 LAB — CBC WITH DIFFERENTIAL/PLATELET
Abs Immature Granulocytes: 0.02 10*3/uL (ref 0.00–0.07)
Basophils Absolute: 0 10*3/uL (ref 0.0–0.1)
Basophils Relative: 0 %
Eosinophils Absolute: 0.1 10*3/uL (ref 0.0–0.5)
Eosinophils Relative: 2 %
HCT: 34.4 % — ABNORMAL LOW (ref 36.0–46.0)
Hemoglobin: 11.3 g/dL — ABNORMAL LOW (ref 12.0–15.0)
Immature Granulocytes: 0 %
Lymphocytes Relative: 22 %
Lymphs Abs: 1.3 10*3/uL (ref 0.7–4.0)
MCH: 31.4 pg (ref 26.0–34.0)
MCHC: 32.8 g/dL (ref 30.0–36.0)
MCV: 95.6 fL (ref 80.0–100.0)
Monocytes Absolute: 0.4 10*3/uL (ref 0.1–1.0)
Monocytes Relative: 7 %
Neutro Abs: 3.9 10*3/uL (ref 1.7–7.7)
Neutrophils Relative %: 69 %
Platelets: 165 10*3/uL (ref 150–400)
RBC: 3.6 MIL/uL — ABNORMAL LOW (ref 3.87–5.11)
RDW: 13.7 % (ref 11.5–15.5)
WBC: 5.7 10*3/uL (ref 4.0–10.5)
nRBC: 0 % (ref 0.0–0.2)

## 2021-06-24 LAB — LIPASE, BLOOD: Lipase: 41 U/L (ref 11–51)

## 2021-06-24 NOTE — ED Provider Triage Note (Signed)
Emergency Medicine Provider Triage Evaluation Note ? ?Hannah Orozco , a 41 y.o. female  was evaluated in triage.  Pt complains of abd pain. ? ?Review of Systems  ?Positive: LLQ pain ?Negative: Fever, dysuria, hematuria, hematochezia, melena ? ?Physical Exam  ?BP 104/72   Pulse 62   Temp (!) 97.5 ?F (36.4 ?C) (Oral)   Resp 16   SpO2 100%  ?Gen:   Awake, no distress   ?Resp:  Normal effort  ?MSK:   Moves extremities without difficulty  ?Other:   ? ?Medical Decision Making  ?Medically screening exam initiated at 3:47 PM.  Appropriate orders placed.  Hannah Orozco was informed that the remainder of the evaluation will be completed by another provider, this initial triage assessment does not replace that evaluation, and the importance of remaining in the ED until their evaluation is complete. ? ?Recurring LLQ pain x 3 weeks, worsen today.  ?  ?Fayrene Helper, PA-C ?06/24/21 1556 ? ?

## 2021-06-24 NOTE — ED Triage Notes (Signed)
Pt c/o "burning, painful sensation" in lower abdomen x3wks, last night pain worsening. Associated back pain. Denies N/V/D, urinary symptoms.  ?

## 2021-06-25 ENCOUNTER — Emergency Department (HOSPITAL_COMMUNITY): Payer: Self-pay

## 2021-06-25 MED ORDER — SODIUM CHLORIDE 0.9 % IV BOLUS
1000.0000 mL | Freq: Once | INTRAVENOUS | Status: AC
  Administered 2021-06-25: 1000 mL via INTRAVENOUS

## 2021-06-25 MED ORDER — IOHEXOL 300 MG/ML  SOLN
75.0000 mL | Freq: Once | INTRAMUSCULAR | Status: AC | PRN
  Administered 2021-06-25: 75 mL via INTRAVENOUS

## 2021-06-25 MED ORDER — TRAMADOL HCL 50 MG PO TABS
50.0000 mg | ORAL_TABLET | Freq: Four times a day (QID) | ORAL | 0 refills | Status: DC | PRN
Start: 2021-06-25 — End: 2022-06-02

## 2021-06-25 MED ORDER — TRAMADOL HCL 50 MG PO TABS
50.0000 mg | ORAL_TABLET | Freq: Once | ORAL | Status: AC
  Administered 2021-06-25: 50 mg via ORAL
  Filled 2021-06-25: qty 1

## 2021-06-25 MED ORDER — NAPROXEN 500 MG PO TABS: 500.0000 mg | ORAL_TABLET | Freq: Two times a day (BID) | ORAL | 0 refills | Status: DC

## 2021-06-25 NOTE — ED Notes (Signed)
Pt verbalized understanding of d/c instructions, meds, and followup care. Denies questions. VSS, no distress noted. Steady gait to exit with all belongings. Eulis Foster RN provides translation. ?

## 2021-06-25 NOTE — Discharge Instructions (Signed)
Begin taking naproxen as prescribed.  Begin taking tramadol as prescribed as needed for pain not relieved with naproxen. ? ?Follow-up with primary doctor if not improving in the next week, and return to the ER if you develop worsening pain, high fevers, bloody stools, or other new and concerning symptoms. ?

## 2021-06-25 NOTE — ED Provider Notes (Signed)
?[facility] EMERGENCY DEPARTMENT ?Provider Note ? ? ?CSN: [id number] ?Arrival date & time: 06/24/21  1439 ? ?  ? ?History ? ?Chief Complaint  ?Patient presents with  ? Abdominal Pain  ? ? ?Hannah Orozco is a 41 y.o. female. ? ?Patient is a 41 year old female with past medical history of esophageal reflux.  Patient presenting today for evaluation of abdominal pain.  She describes left lower quadrant pain that has been present and constant for the past 2 weeks.  This began in the absence of any injury or trauma.  Pain is worse when she moves or presses in the area.  She denies any fevers, chills, nausea, vomiting, or diarrhea.  She denies urinary symptoms. ? ?History taken with the assistance of the translator tablet as patient speaks little [place], mainly [place]. ? ?The history is provided by the patient.  ?Abdominal Pain ?Pain location:  LLQ ?Pain quality: aching   ?Pain radiates to:  Does not radiate ?Pain severity:  Moderate ?Onset quality:  Sudden ?Duration:  2 weeks ?Timing:  Constant ?Progression:  Worsening ?Chronicity:  New ?Relieved by:  Nothing ?Worsened by:  Movement and palpation ? ?  ? ?Home Medications ?Prior to Admission medications   ?Medication Sig Start Date End Date Taking? Authorizing Provider  ?famotidine (PEPCID) 20 MG tablet TAKE 1 TABLET BY MOUTH TWICE DAILY AS NEEDED FOR HEARTBURN OR INDIGESTION 09/24/19   Allayne Stack, DO  ?ferrous sulfate 325 (65 FE) MG tablet Take 1 tablet (325 mg total) by mouth 2 (two) times daily with a meal. 09/23/17   Doreene Eland, MD  ?ibuprofen (ADVIL) 600 MG tablet Take 1 tablet (600 mg total) by mouth every 6 (six) hours as needed. 08/30/20   Long, Arlyss Repress, MD  ?ondansetron (ZOFRAN) 4 MG tablet Take 1 tablet (4 mg total) by mouth every 8 (eight) hours as needed for nausea or vomiting. 05/19/17   Tillman Sers, DO  ?ranitidine (ZANTAC) 150 MG tablet Take 1 tablet (150 mg total) by mouth 2 (two) times daily as needed for heartburn.  05/19/17   Tillman Sers, DO  ?   ? ?Allergies    ?Patient has no known allergies.   ? ?Review of Systems   ?Review of Systems  ?Gastrointestinal:  Positive for abdominal pain.  ?All other systems reviewed and are negative. ? ?Physical Exam ?Updated Vital Signs ?BP 101/60 (BP Location: Right Arm)   Pulse 63   Temp (!) 97.5 ?F (36.4 ?C) (Oral)   Resp 16   SpO2 100%  ?Physical Exam ?Vitals and nursing note reviewed.  ?Constitutional:   ?   General: She is not in acute distress. ?   Appearance: She is well-developed. She is not diaphoretic.  ?HENT:  ?   Head: Normocephalic and atraumatic.  ?Cardiovascular:  ?   Rate and Rhythm: Normal rate and regular rhythm.  ?   Heart sounds: No murmur heard. ?  No friction rub. No gallop.  ?Pulmonary:  ?   Effort: Pulmonary effort is normal. No respiratory distress.  ?   Breath sounds: Normal breath sounds. No wheezing.  ?Abdominal:  ?   General: Bowel sounds are normal. There is no distension.  ?   Palpations: Abdomen is soft.  ?   Tenderness: There is abdominal tenderness in the left lower quadrant. There is no right CVA tenderness, left CVA tenderness, guarding or rebound.  ?Musculoskeletal:     ?   General: Normal range of motion.  ?  Cervical back: Normal range of motion and neck supple.  ?Skin: ?   General: Skin is warm and dry.  ?Neurological:  ?   General: No focal deficit present.  ?   Mental Status: She is alert and oriented to person, place, and time.  ? ? ?ED Results / Procedures / Treatments   ?Labs ?(all labs ordered are listed, but only abnormal results are displayed) ?Labs Reviewed  ?CBC WITH DIFFERENTIAL/PLATELET - Abnormal; Notable for the following components:  ?    Result Value  ? RBC 3.60 (*)   ? Hemoglobin 11.3 (*)   ? HCT 34.4 (*)   ? All other components within normal limits  ?COMPREHENSIVE METABOLIC PANEL - Abnormal; Notable for the following components:  ? Anion gap 4 (*)   ? All other components within normal limits  ?URINALYSIS, ROUTINE W REFLEX  MICROSCOPIC - Abnormal; Notable for the following components:  ? APPearance HAZY (*)   ? All other components within normal limits  ?LIPASE, BLOOD  ?I-STAT BETA HCG BLOOD, ED ([facility], [facility], [facility] ONLY)  ? ? ?EKG ?None ? ?Radiology ?No results found. ? ?Procedures ?Procedures  ? ? ?Medications Ordered in ED ?Medications  ?sodium chloride 0.9 % bolus 1,000 mL (has no administration in time range)  ? ? ?ED Course/ Medical Decision Making/ A&P ? ?This patient presents to the ED for concern of left lower quadrant abdominal pain, this involves an extensive number of treatment options, and is a complaint that carries with it a high risk of complications and morbidity.  The differential diagnosis includes ovarian cyst, ovarian torsion, diverticulitis, musculoskeletal ? ? ?Co morbidities that complicate the patient evaluation ? ?None ? ? ?Additional history obtained: ? ?Additional history obtained from use of the translator tablet as patient speaks little English ?No external records needed ? ? ?Lab Tests: ? ?I Ordered, and personally interpreted labs.  The pertinent results include: CBC, metabolic panel, lipase, urinalysis and qualitative pregnancy test.  All of these were essentially unremarkable. ? ? ?Imaging Studies ordered: ? ?I ordered imaging studies including CT scan of the abdomen and pelvis ?I independently visualized and interpreted imaging which showed no acute intra-abdominal process ?I agree with the radiologist interpretation ? ? ?Cardiac Monitoring: / EKG: ? ?None indicated ? ? ?Consultations Obtained: ? ?No consultations needed or ordered ? ? ?Problem List / ED Course / Critical interventions / Medication management ? ?Patient presenting with left lower quadrant pain for the past 2 weeks.  I suspect a musculoskeletal etiology as her laboratory studies, urinalysis, and CT scan are unremarkable.  Pregnancy test is negative, thus ruling out ectopic.  I also highly doubt torsion as pain has been persistent for 2 weeks.   Patient to be treated with naproxen and tramadol.  She is to follow-up with primary doctor if not improving. ?I ordered medication including tramadol for pain ?Reevaluation of the patient after these medicines showed that the patient improved ?I have reviewed the patients home medicines and have made adjustments as needed ? ? ?Social Determinants of Health: ? ?None ? ? ?Test / Admission - Considered: ? ?Patient to be discharged.  I see no indication for admission. ? ? ?Final Clinical Impression(s) / ED Diagnoses ?Final diagnoses:  ?None  ? ? ?Rx / DC Orders ?ED Discharge Orders   ? ? None  ? ?  ? ? ?  ?Geoffery Lyons, MD ?06/25/21 0124 ? ?

## 2021-07-27 ENCOUNTER — Other Ambulatory Visit: Payer: Self-pay | Admitting: Nurse Practitioner

## 2021-07-27 DIAGNOSIS — K4090 Unilateral inguinal hernia, without obstruction or gangrene, not specified as recurrent: Secondary | ICD-10-CM

## 2021-08-03 ENCOUNTER — Ambulatory Visit
Admission: RE | Admit: 2021-08-03 | Discharge: 2021-08-03 | Disposition: A | Discharge status: Home / Self Care | Payer: No Typology Code available for payment source | Source: Ambulatory Visit | Attending: Nurse Practitioner | Admitting: Nurse Practitioner

## 2021-08-03 ENCOUNTER — Other Ambulatory Visit: Payer: Self-pay | Admitting: Nurse Practitioner

## 2021-08-03 DIAGNOSIS — K4090 Unilateral inguinal hernia, without obstruction or gangrene, not specified as recurrent: Secondary | ICD-10-CM

## 2021-08-21 ENCOUNTER — Telehealth: Payer: Self-pay

## 2021-08-21 ENCOUNTER — Ambulatory Visit (INDEPENDENT_AMBULATORY_CARE_PROVIDER_SITE_OTHER): Payer: Self-pay | Admitting: Nurse Practitioner

## 2021-08-21 ENCOUNTER — Encounter: Payer: Self-pay | Admitting: Nurse Practitioner

## 2021-08-21 VITALS — BP 118/78 | Ht <= 58 in | Wt 107.0 lb

## 2021-08-21 DIAGNOSIS — R1032 Left lower quadrant pain: Secondary | ICD-10-CM

## 2021-08-21 NOTE — Telephone Encounter (Signed)
-----   Message from Olivia Mackie, NP sent at 08/21/2021  3:23 PM EDT ----- Regarding: GI referral Please send referral to GI for persistent LLQ pain. She is spanish speaking.

## 2021-08-21 NOTE — Telephone Encounter (Signed)
Referral has been sent to [facility].  Would you mind calling the pt and letting her know that the referral has been sent. If she doesn't here from that office by the end of next week, she can call them at [phone]. Thank you!

## 2021-08-21 NOTE — Progress Notes (Signed)
   Acute Office Visit  Subjective:    Patient ID: Hannah Orozco, female    DOB: 22-Dec-1980, 41 y.o.   MRN: [id number]   HPI 41 y.o. presents as new patient for LLQ abdominal pain. This has been ongoing for 3 months, is intermittent, unable to describe, increased activity makes it worse, nothing makes it better. Not related to menstrual cycle. She has H/O irregular menses ranging from 1-3 months. Denies changes in bowel habits. Denies urinary or vaginal symptoms. Was seen in ER for same symptoms in April with negative workup and thought to be musculoskeletal. Normal pelvic ultrasound 08/03/2021. Normal pap history, most recent 07/20/2019.    Review of Systems  Constitutional: Negative.   Gastrointestinal:  Positive for abdominal pain. Negative for blood in stool, constipation, diarrhea, nausea and vomiting.  Genitourinary: Negative.        Objective:    Physical Exam Constitutional:      Appearance: Normal appearance.  Abdominal:     Tenderness: There is abdominal tenderness in the left lower quadrant. There is no guarding or rebound.     Hernia: No hernia is present.  Genitourinary:    General: Normal vulva.     Vagina: Normal.     Cervix: Normal.     Uterus: Not enlarged and not tender.      BP 118/78   Ht 4\' 9"  (1.448 m)   Wt 107 lb (48.5 kg)   LMP 06/16/2021   BMI 23.15 kg/m  Wt Readings from Last 3 Encounters:  08/21/21 107 lb (48.5 kg)  08/29/20 105 lb 13.1 oz (48 kg)  07/20/19 105 lb 12.8 oz (48 kg)        Patient informed chaperone available to be present for breast and/or pelvic exam. Patient has requested no chaperone to be present. Patient has been advised what will be completed during breast and pelvic exam.   Assessment & Plan:   Problem List Items Addressed This Visit   None Visit Diagnoses     Left lower quadrant abdominal pain    -  Primary      Plan: No evidence of GYN cause - normal CT scan and pelvic ultrasound. Normal exam today other  than mild tenderness in left lower quadrant. Likely GI in nature. Will send referral.      09/19/19 DNP, 3:23 PM 08/21/2021

## 2021-08-24 NOTE — Telephone Encounter (Signed)
Per Elane Fritz C: "Patient was informed of message below. Information was provided in [place]."

## 2021-08-26 ENCOUNTER — Other Ambulatory Visit: Payer: Self-pay | Admitting: Obstetrics and Gynecology

## 2021-08-26 DIAGNOSIS — Z1231 Encounter for screening mammogram for malignant neoplasm of breast: Secondary | ICD-10-CM

## 2021-09-30 NOTE — Telephone Encounter (Signed)
Would you mind following up with pt to see if she has had the chance to contact the gastroenterologist at [facility] for an appt?  Their number is [phone]. Thanks.

## 2021-09-30 NOTE — Telephone Encounter (Signed)
Inbound call from patient stating she has not received a call from [facility].  Gave her the number and she stated will call to setup an appt.

## 2021-10-27 NOTE — Telephone Encounter (Signed)
Referral successfully faxed to [facility].

## 2021-10-27 NOTE — Telephone Encounter (Signed)
Per Elane Fritz: "Spoke with patient she has called them and there has been no answer. Patient said yes its ok to send the referral to a different GI office."

## 2021-10-27 NOTE — Telephone Encounter (Signed)
I dont see an appt made for this pt for the gastroenterologist. Would you mind following up with her to see if she has tried reaching out to [facility]? I have heard from a few pt's that they have had trouble getting a person on the phone when calling them recently. If this is also the case for her, we can offer the pt a referral to a different office if she would like. Let us know. Thank you.

## 2021-10-28 NOTE — Telephone Encounter (Signed)
Spoke with Lynnell Catalan @ [facility] to confirm they received referral. She stated that pt is "innactive" in their system. States that she would have to get in touch with their billing dept before they can make her an appt at that office. If desires, that phone number is [phone] opt. 1.  There is two other options that I know of for her as far as referrals go. There is a provider that comes highly recommended in [place] but because of that, she is very busy and it can take some time for her file to be approved by the office/provider and for an appt to get scheduled with them.   If she wouldn't mind a little travel, there is an office in [place] that seems to be a bit quicker. Its called [facility] and they are affiliated w/ [facility].   Please offer these options to her and let us know which she would prefer. Thank you.

## 2021-11-04 NOTE — Telephone Encounter (Signed)
Referral to Dr. Mann/Dr. Haywood Pao office faxed successfully.

## 2021-11-04 NOTE — Telephone Encounter (Signed)
Per Elane Fritz: "Called patient with information below. She said she doesn't mind waiting a little while for the doctor in [place]."  Will fax pt's info to Dr. Kenna Gilbert office.

## 2021-11-06 NOTE — Telephone Encounter (Signed)
Msg in triage box from Dr. Mann/Dr. Haywood Pao office stating that they reviewed the pt's records and they are unable to see the pt and to call if any questions or concerns.   Shall I reopen/send another referral to [facility] or are you aware of another local GI office that I can send a referral to?  Please advise.

## 2021-11-09 NOTE — Telephone Encounter (Signed)
I would send referral to [facility] again if she is still having symptoms.

## 2021-11-09 NOTE — Telephone Encounter (Signed)
Please inform pt that for unknown reasons, her referral was denied to the other GI doctor that was in [place]. If she is still experiencing the LLQ pains, Tiffany said we can resend the referral to the [place] location in [place] in which she can continue to keep contacting or wait until they contact her for an appt. Or if she would like, we can either try the [place] or [place] location which would require some travel but those are other options that could potentially be quicker as far as getting an appt. Thanks in advance.

## 2021-11-09 NOTE — Telephone Encounter (Signed)
No ma'am, the referral coordinator states that the providers just tell her yes/no.

## 2021-11-09 NOTE — Telephone Encounter (Signed)
Did they say why they could not see her?

## 2021-11-12 NOTE — Telephone Encounter (Signed)
[facility] -has this patient been called?

## 2021-11-12 NOTE — Telephone Encounter (Signed)
Berna Spare A, [name]  You 2 hours ago (1:30 PM)    Yes, I have left a message for patient to return call.

## 2021-11-19 ENCOUNTER — Ambulatory Visit
Admission: RE | Admit: 2021-11-19 | Discharge: 2021-11-19 | Disposition: A | Discharge status: Home / Self Care | Payer: Self-pay | Source: Ambulatory Visit | Attending: Obstetrics and Gynecology | Admitting: Obstetrics and Gynecology

## 2021-11-19 ENCOUNTER — Ambulatory Visit: Payer: Self-pay | Admitting: *Deleted

## 2021-11-19 VITALS — BP 105/67 | Wt 108.0 lb

## 2021-11-19 DIAGNOSIS — Z1239 Encounter for other screening for malignant neoplasm of breast: Secondary | ICD-10-CM

## 2021-11-19 DIAGNOSIS — Z1231 Encounter for screening mammogram for malignant neoplasm of breast: Secondary | ICD-10-CM

## 2021-11-19 NOTE — Progress Notes (Signed)
[name] is a 41 y.o. female who presents to [facility] clinic today with no complaints.    Pap Smear: Pap smear not completed today. Last Pap smear was 07/20/2019 at [facility] clinic and was normal with negative HPV. Per patient has no history of an abnormal Pap smear. Last Pap smear result is available in Epic.   Physical exam: Breasts Breasts symmetrical. No skin abnormalities bilateral breasts. No nipple retraction bilateral breasts. No nipple discharge bilateral breasts. No lymphadenopathy. No lumps palpated bilateral breasts. No complaints of pain or tenderness on exam.  Pelvic/Bimanual Pap is not indicated today per BCCCP guidelines.   Smoking History: Patient has never smoked.   Patient Navigation: Patient education provided. Access to services provided for patient through [facility] program. Spanish interpreter Natale Lay from [facility] provided.    Breast and Cervical Cancer Risk Assessment: Patient does not have family history of breast cancer, known genetic mutations, or radiation treatment to the chest before age 62. Patient does not have history of cervical dysplasia, immunocompromised, or DES exposure in-utero.  Risk Assessment     Risk Scores       11/19/2021   Last edited by: Meryl Dare, [name]   5-year risk: 0.6 %   Lifetime risk: 9.6 %           A: BCCCP exam without pap smear No complaints.  P: Referred patient to the [facility] for a screening mammogram on mobile unit. Appointment scheduled Thursday, November 19, 2021 at 1110.  Priscille Heidelberg, RN 11/19/2021 10:49 AM

## 2021-11-19 NOTE — Patient Instructions (Signed)
Explained breast self awareness with Sheliah Mends. Patient did not need a Pap smear today due to last Pap smear and HPV typing was 07/20/2019. Let her know BCCCP will cover Pap smears and HPV typing every 5 years unless has a history of abnormal Pap smears. Referred patient to the [facility] for a screening mammogram on mobile unit. Appointment scheduled Thursday, November 19, 2021 at 1110. Patient aware of appointment and will be there. Let patient know the Breast Center will follow up with her within the next couple weeks with results of mammogram by letter or phone. Sheliah Mends verbalized understanding.  Tien Aispuro, Kathaleen Maser, RN 10:49 AM

## 2021-12-02 NOTE — Telephone Encounter (Signed)
Would you mind trying this pt one more time and if she doesn't answer or callback after that I will send back to TW.

## 2021-12-03 NOTE — Telephone Encounter (Signed)
Referral resent  

## 2021-12-03 NOTE — Telephone Encounter (Signed)
Per Hannah Orozco: "Patient said to send the referral back to [facility] and she will call them to schedule appointment."

## 2022-01-04 ENCOUNTER — Other Ambulatory Visit: Payer: Self-pay

## 2022-01-04 ENCOUNTER — Ambulatory Visit: Payer: Self-pay

## 2022-02-25 ENCOUNTER — Encounter: Payer: Self-pay | Admitting: Nurse Practitioner

## 2022-02-25 NOTE — Telephone Encounter (Signed)
Hannah Orozco, [name] Patient states she called and there was not spanish speaker was told they would call back with interpreter but she never got call. Patient is requesting if we can make appointment for her.  Pt is scheduled for 03/30/22 @ 230. Will send paper to address w/ appt information and forms for pt to fill out. Will send message to in office interpreter to inform pt.

## 2022-02-25 NOTE — Telephone Encounter (Signed)
Msg sent to spanish pool to inquire about referral to [facility].

## 2022-02-26 NOTE — Telephone Encounter (Signed)
FYI. As of 02/25/22:  Jennye Moccasin, [name] Patient informed."

## 2022-03-30 ENCOUNTER — Encounter: Payer: Self-pay | Admitting: Nurse Practitioner

## 2022-03-30 ENCOUNTER — Ambulatory Visit (INDEPENDENT_AMBULATORY_CARE_PROVIDER_SITE_OTHER): Payer: Self-pay | Admitting: Nurse Practitioner

## 2022-03-30 ENCOUNTER — Other Ambulatory Visit (INDEPENDENT_AMBULATORY_CARE_PROVIDER_SITE_OTHER): Payer: Self-pay

## 2022-03-30 VITALS — BP 118/70 | HR 64 | Ht <= 58 in | Wt 108.0 lb

## 2022-03-30 DIAGNOSIS — R109 Unspecified abdominal pain: Secondary | ICD-10-CM

## 2022-03-30 DIAGNOSIS — R1032 Left lower quadrant pain: Secondary | ICD-10-CM

## 2022-03-30 LAB — CBC WITH DIFFERENTIAL/PLATELET
Basophils Absolute: 0 10*3/uL (ref 0.0–0.1)
Basophils Relative: 0.7 % (ref 0.0–3.0)
Eosinophils Absolute: 0.2 10*3/uL (ref 0.0–0.7)
Eosinophils Relative: 3.2 % (ref 0.0–5.0)
HCT: 36.2 % (ref 36.0–46.0)
Hemoglobin: 11.9 g/dL — ABNORMAL LOW (ref 12.0–15.0)
Lymphocytes Relative: 25.5 % (ref 12.0–46.0)
Lymphs Abs: 1.5 10*3/uL (ref 0.7–4.0)
MCHC: 32.9 g/dL (ref 30.0–36.0)
MCV: 92.4 fl (ref 78.0–100.0)
Monocytes Absolute: 0.5 10*3/uL (ref 0.1–1.0)
Monocytes Relative: 8.6 % (ref 3.0–12.0)
Neutro Abs: 3.6 10*3/uL (ref 1.4–7.7)
Neutrophils Relative %: 62 % (ref 43.0–77.0)
Platelets: 186 10*3/uL (ref 150.0–400.0)
RBC: 3.92 Mil/uL (ref 3.87–5.11)
RDW: 14 % (ref 11.5–15.5)
WBC: 5.8 10*3/uL (ref 4.0–10.5)

## 2022-03-30 LAB — COMPREHENSIVE METABOLIC PANEL
ALT: 10 U/L (ref 0–35)
AST: 16 U/L (ref 0–37)
Albumin: 4.6 g/dL (ref 3.5–5.2)
Alkaline Phosphatase: 90 U/L (ref 39–117)
BUN: 10 mg/dL (ref 6–23)
CO2: 31 mEq/L (ref 19–32)
Calcium: 9.3 mg/dL (ref 8.4–10.5)
Chloride: 102 mEq/L (ref 96–112)
Creatinine, Ser: 0.82 mg/dL (ref 0.40–1.20)
GFR: 88.37 mL/min (ref >60.00)
Glucose, Bld: 91 mg/dL (ref 70–99)
Potassium: 4.1 mEq/L (ref 3.5–5.1)
Sodium: 139 mEq/L (ref 135–145)
Total Bilirubin: 0.2 mg/dL (ref 0.2–1.2)
Total Protein: 7.7 g/dL (ref 6.0–8.3)

## 2022-03-30 LAB — HCG, QUANTITATIVE, PREGNANCY: Quantitative HCG: 2.84 m[IU]/mL

## 2022-03-30 NOTE — Progress Notes (Signed)
03/30/2022 Nekesha Font [id number] October 21, 1980   CHIEF COMPLAINT: LLQ pain   HISTORY OF PRESENT ILLNESS: Chiante L. Horald Pollen Milbert Coulter is a 42 year old female with a past medical history of anxiety, depression, anemia,  mild gestational thrombocytopenia, preeclampsia and GERD. She presents to our office today as referred by Marny Lowenstein NP for further evaluation regarding LLQ pain.  She speaks Spanish therefore she is accompanied by a [place] Spanish interpreter to facilitate communication throughout today's consult.  She endorses having LLQ pain which started 9 months ago which comes and goes.  Her LLQ pain can occur for several consecutive days then goes away for few days then recurs.  He describes her LLQ pain as achy and burning.  No food triggers.  Her LLQ pain does not improve or worsen after she passes a bowel movement.  Walking or bending forward sometimes worsens her abdominal pain.  Passes a normal formed brown bowel movement daily.  No rectal bleeding or black stools.  No weight loss.  No fevers, sweats or chills.  No menstrual cycle for the past 6 to 8 months.  She reported seeing her gynecologist 3 months ago and a pelvic exam was normal.  She underwent a CTAP 06/23/2021 in the ED due to having LLQ pain which was unrevealing. She denies ever having a colonoscopy.  No known family history of colorectal cancer.  She has a remote history of GERD.  She underwent an EGD in [place] in 2004.  She underwent an EGD by Dr. Teena Irani 05/10/2003 due to having dysphagia and manometry showed probable achalasia.  She underwent Botox injections and her dysphagia abated.  She denies having any current heartburn or dysphagia symptoms at this time.     Latest Ref Rng & Units 06/24/2021    4:01 PM 07/20/2019   11:05 AM 01/17/2018   11:08 AM  CBC  WBC 4.0 - 10.5 K/uL 5.7  5.5  5.5   Hemoglobin 12.0 - 15.0 g/dL 11.3  12.7  12.3   Hematocrit 36.0 - 46.0 % 34.4  39.2  37.6   Platelets 150 - 400 K/uL 165  169   156         Latest Ref Rng & Units 06/24/2021    4:01 PM 06/03/2016    3:56 PM 08/01/2014   11:31 AM  CMP  Glucose 70 - 99 mg/dL 82  87  73   BUN 6 - 20 mg/dL 9  9  7    Creatinine 0.44 - 1.00 mg/dL 0.49  0.48  0.43   Sodium 135 - 145 mmol/L 138  141  135   Potassium 3.5 - 5.1 mmol/L 3.9  4.8  3.8   Chloride 98 - 111 mmol/L 106  101  103   CO2 22 - 32 mmol/L 28  23  24    Calcium 8.9 - 10.3 mg/dL 9.0  9.4  8.5   Total Protein 6.5 - 8.1 g/dL 7.2   6.1   Total Bilirubin 0.3 - 1.2 mg/dL 0.6   0.4   Alkaline Phos 38 - 126 U/L 60   121   AST 15 - 41 U/L 18   16   ALT 0 - 44 U/L 15   10     CTAP 06/23/2021:  Lower chest: No acute abnormality.   Hepatobiliary: No focal liver abnormality is seen. No gallstones, gallbladder wall thickening, or biliary dilatation.   Pancreas: Unremarkable. No pancreatic ductal dilatation or surrounding inflammatory changes.  Spleen: Normal in size without focal abnormality.   Adrenals/Urinary Tract: Adrenal glands are within normal limits. Kidneys are well visualized bilaterally. Normal excretion is seen bilaterally. No calculi are noted. The bladder is well distended.   Stomach/Bowel: The appendix is within normal limits. No obstructive or inflammatory changes of the colon are seen. The stomach and small bowel are within normal limits.   Vascular/Lymphatic: No significant vascular findings are present. No enlarged abdominal or pelvic lymph nodes.   Reproductive: Uterus is within normal limits. No adnexal mass is seen.   Other: No abdominal wall hernia or abnormality. No abdominopelvic ascites.   Musculoskeletal: No acute or significant osseous findings.   IMPRESSION: No acute abnormality noted to correspond with the given clinical history.     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 Beverlyn Roux - pager [phone]) Please also call Dr. Chrisandra Netters (cell [phone], pager [phone]) for delivery  Genetic Screen  declined  Anatomic [place]  normal female  Glucose Screen  90  GBS    Feeding Preference  Breast and bottle  Contraception  undecided  Circumcision  n/a  Pap  09/2012  Other  size < dates, but [place] at 36w shows estimated weight at 20th %   Thrombocytopenia [facility])    Past Surgical History:  Procedure Laterality Date   ESOPHAGUS SURGERY  ~2004   laparoscopic, ? opened up esophagus so she could eat/swallow   Social History: Nonsmoker. No alcohol use. No drug use.   Family History: Mother with history of diabetes.  Father with history of hypertension, deceased at the age of 23 secondary to non-Hodgkin's lymphoma.  No known family history of esophageal, gastric or colon cancer.  No Known Allergies    Outpatient Encounter Medications as of 03/30/2022  Medication Sig   famotidine (PEPCID) 20 MG tablet TAKE 1 TABLET BY MOUTH TWICE DAILY AS NEEDED FOR HEARTBURN OR INDIGESTION   ibuprofen (ADVIL) 600 MG tablet Take 1 tablet (600 mg total) by mouth every 6 (six) hours as needed.   ferrous sulfate 325 (65 FE) MG tablet Take 1 tablet (325 mg total) by mouth 2 (two) times daily with a meal. (Patient not taking: Reported on 03/30/2022)   naproxen (NAPROSYN) 500 MG tablet Take 1 tablet (500 mg total) by mouth 2 (two) times daily. (Patient not taking: Reported on 03/30/2022)   ondansetron (ZOFRAN) 4 MG tablet Take 1 tablet (4 mg total) by mouth every 8 (eight) hours as needed for nausea or vomiting. (Patient not taking: Reported on  03/30/2022)   ranitidine (ZANTAC) 150 MG tablet Take 1 tablet (150 mg total) by mouth 2 (two) times daily as needed for heartburn. (Patient not taking: Reported on 03/30/2022)   traMADol (ULTRAM) 50 MG tablet Take 1 tablet (50 mg total) by mouth every 6 (six) hours as needed. (Patient not taking: Reported on 03/30/2022)   No facility-administered encounter medications on file as of 03/30/2022.    REVIEW OF SYSTEMS:  Gen: Denies fever, sweats or chills. No weight loss.  CV: Denies chest pain, palpitations or edema. Resp: Denies cough, shortness of breath of hemoptysis.  GI: See HPI. GU : Denies urinary burning, blood in urine, increased urinary frequency or incontinence. MS: Denies joint pain, muscles aches or weakness. Derm: Denies rash, itchiness, skin lesions or unhealing ulcers. Psych: Denies depression, anxiety, memory loss or confusion. Heme: Denies bruising, easy bleeding. Neuro:  + Headaches. Endo:  Denies any problems with DM, thyroid or adrenal function.  PHYSICAL EXAM: Pulse 64   Ht 4\' 10"  (1.473 m)   Wt 108 lb (49 kg)   SpO2 99%   BMI 22.57 kg/m  General: 42 year old female in no acute distress. Head: Normocephalic and atraumatic. Eyes:  Sclerae non-icteric, conjunctive pink. Ears: Normal auditory acuity. Mouth: Dentition intact. No ulcers or lesions.  Neck: Supple, no lymphadenopathy or thyromegaly.  Lungs: Clear bilaterally to auscultation without wheezes, crackles or rhonchi. Heart: Regular rate and rhythm. No murmur, rub or gallop appreciated.  Abdomen: Soft, nondistended. Moderate LLQ tenderness without rebound or guarding. No masses. No hepatosplenomegaly. Normoactive bowel sounds x 4 quadrants.  Rectal: Deferred.  Musculoskeletal: Symmetrical with no gross deformities. Skin: Warm and dry. No rash or lesions on visible extremities. Extremities: No edema. Neurological: Alert oriented x 4, no focal deficits.  Psychological:  Alert and cooperative. Normal mood and  affect.  ASSESSMENT AND PLAN:  83) 42 year old female with intermittent LLQ pain x 9 months. CTAP 06/2021 was unrevealing.  Patient reported undergoing a recent pelvic exam by GYN which was normal.  Moderate LLQ pain without rebound or guarding on exam. LMP 6 to 8 months ago.  -CTAP with oral and IV contrast. Beta HCG quant, result to be reviewed prior to patient proceeding with a CTAP -Diagnostic colonoscopy if CTAP negative -MiraLAX nightly as tolerated to eliminate left colon stool burden  -Recommend pelvic sonogram with gynecologist and further follow-up regarding amenorrhea  2) History of GERD and dysphagia, s/p Botox injections per EGD in 2005.  No recent GERD or dysphagia symptoms.    CC:  2006, DO

## 2022-03-30 NOTE — Patient Instructions (Addendum)
Su proveedor le ha solicitado que vaya al stano para Optometrist anlisis de laboratorio antes de irse hoy. Presione "B" en el ascensor. El laboratorio est ubicado en la primera puerta a la izquierda al salir del Materials engineer.  Miralax- todas las noches segn la tolerancia  Se le ha programado una tomografa computarizada del abdomen y la pelvis en el primer piso de [place] programado el  04/06/22 at 3:00 pm . Deberas llegar a las 14:45.  You may take any medications as prescribed with a small amount of water, if necessary. If you take any of the following medications: METFORMIN, GLUCOPHAGE, GLUCOVANCE, AVANDAMET, RIOMET, FORTAMET, [place] MET, JANUMET, GLUMETZA or METAGLIP, you MAY be asked to HOLD this medication 48 hours AFTER the exam.   The purpose of you drinking the oral contrast is to aid in the visualization of your intestinal tract. The contrast solution may cause some diarrhea. Depending on your individual set of symptoms, you may also receive an intravenous injection of x-ray contrast/dye. Plan on being at [facility] for 45 minutes or longer, depending on the type of exam you are having performed.   If you have any questions regarding your exam or if you need to reschedule, you may call [facility] at [phone] between the hours of 8:00 am and 5:00 pm, Monday-Friday.   Thank you for trusting me with your gastrointestinal care!   Carl Best, CRNP

## 2022-03-30 NOTE — Progress Notes (Signed)
Attending Physician's Attestation   I have reviewed the chart.   I agree with the Advanced Practitioner's note, impression, and recommendations with any updates as below.    Paloma Grange Mansouraty, MD [facility] Advanced [facility]oscopy Office # [phone]  

## 2022-04-06 ENCOUNTER — Ambulatory Visit (HOSPITAL_COMMUNITY)
Admission: RE | Admit: 2022-04-06 | Discharge: 2022-04-06 | Disposition: A | Discharge status: Home / Self Care | Payer: Self-pay | Source: Ambulatory Visit | Attending: Nurse Practitioner | Admitting: Nurse Practitioner

## 2022-04-06 DIAGNOSIS — R109 Unspecified abdominal pain: Secondary | ICD-10-CM | POA: Insufficient documentation

## 2022-04-06 DIAGNOSIS — R1032 Left lower quadrant pain: Secondary | ICD-10-CM | POA: Insufficient documentation

## 2022-04-06 MED ORDER — IOHEXOL 300 MG/ML  SOLN
80.0000 mL | Freq: Once | INTRAMUSCULAR | Status: AC | PRN
  Administered 2022-04-06: 80 mL via INTRAVENOUS

## 2022-04-12 ENCOUNTER — Other Ambulatory Visit: Payer: Self-pay

## 2022-04-12 DIAGNOSIS — R109 Unspecified abdominal pain: Secondary | ICD-10-CM

## 2022-04-12 DIAGNOSIS — R1032 Left lower quadrant pain: Secondary | ICD-10-CM

## 2022-04-12 MED ORDER — NA SULFATE-K SULFATE-MG SULF 17.5-3.13-1.6 GM/177ML PO SOLN: ORAL | 0 refills | Status: DC

## 2022-04-22 ENCOUNTER — Encounter: Payer: Self-pay | Admitting: Family Medicine

## 2022-04-22 ENCOUNTER — Ambulatory Visit (INDEPENDENT_AMBULATORY_CARE_PROVIDER_SITE_OTHER): Payer: Self-pay | Admitting: Family Medicine

## 2022-04-22 VITALS — BP 95/75 | HR 74 | Ht 58.27 in | Wt 107.6 lb

## 2022-04-22 DIAGNOSIS — Z0000 Encounter for general adult medical examination without abnormal findings: Secondary | ICD-10-CM

## 2022-04-22 DIAGNOSIS — Z1159 Encounter for screening for other viral diseases: Secondary | ICD-10-CM

## 2022-04-22 DIAGNOSIS — Z13220 Encounter for screening for lipoid disorders: Secondary | ICD-10-CM

## 2022-04-22 DIAGNOSIS — N912 Amenorrhea, unspecified: Secondary | ICD-10-CM

## 2022-04-22 NOTE — Progress Notes (Signed)
    SUBJECTIVE:   Chief compliant/HPI: annual examination  Hannah Orozco is a 42 y.o. who presents today for an annual exam.  Also states she has not have a period in 8 months.  Has been having pain in lower left abdomen since April. States pain is there all day long. Has been following with GI- CT abdomen normal. Has colonoscopy scheduled in march. Saw a gyn doctor in May and states they didn't see anything.  Mom went into menopause at 75. Sister is 57 and still has her cycles   Denies tobacco use, alcohol, recreational rug use   Feels safe in relationship   Only takes Tylenol or Motrin as needed for pain and Pepcid for reflux  No concern about mood  OBJECTIVE:   BP 95/75   Pulse 74   Ht 4' 10.27" (1.48 m)   Wt 107 lb 9.6 oz (48.8 kg)   SpO2 100%   BMI 22.28 kg/m    General: alert, pleasant, NAD CV: RRR no murmurs  Resp: CTAB normal WOB  GI: soft, non distended Derm: warm, dry. No LE edema   ASSESSMENT/PLAN:   Amenorrhea Last menstrual cycle 8 months ago. Not on contraception. No menopausal symptoms. No family history of early menopause. Will obtain [facility] today.     Annual Examination  See AVS for age appropriate recommendations.   PHQ score 2, reviewed and discussed.  Blood pressure reviewed and at goal .  The patient not on contraception.   Considered the following items based upon USPSTF recommendations: Diabetes screening:  not ordered  Screening for elevated cholesterol: ordered Hepatitis C: ordered GC/CT not at high risk and not ordered. Reviewed risk factors for latent tuberculosis and not indicated Reviewed risk factors for osteoporosis and patient is low risk. Dexa not indicated  Cervical cancer screening: prior Pap reviewed, repeat due in 2 years Breast cancer screening:  not due for mammogram    Follow up in 1 month after colonoscopy    [place]

## 2022-04-22 NOTE — Patient Instructions (Addendum)
It was great seeing you today!  We did your physical exam and I am glad you are doing well!   Since you have not had a period in a while we are  checking a hormone in your blood called [place] to see if you may be in early menopause.   We are also checking your cholesterol and one time Hep C screen.   Please schedule to see me after your colonoscopy, but if you need to be seen earlier than that for any new issues we're happy to fit you in, just give [place] a call!  Feel free to call with any questions or concerns at any time, at [phone].   Take care,  Dr. Shary Key [facility]  Fue genial verte hoy!  Hicimos tu examen fsico y me alegro que ests bien!  Como no ha tenido un perodo durante un tiempo, estamos analizando una hormona en su sangre llamada [place] para ver si es posible que est en la menopausia temprana.  Tambin estamos controlando su colesterol y Ardelia Mems prueba de hepatitis C.  Programe una cita para verme despus de su colonoscopia, pero si necesita que lo atiendan antes por cualquier problema nuevo, estaremos encantados de atenderle, simplemente llmenos!  Si tiene alguna pregunta o inquietud, no dude en llamarnos en cualquier momento al [phone].   Cuidarse, Dra. [place] familiar [place]

## 2022-04-23 DIAGNOSIS — N912 Amenorrhea, unspecified: Secondary | ICD-10-CM | POA: Insufficient documentation

## 2022-04-23 LAB — LIPID PANEL
Chol/HDL Ratio: 2.8 ratio (ref 0.0–4.4)
Cholesterol, Total: 178 mg/dL (ref 100–199)
HDL: 63 mg/dL (ref >39)
LDL Chol Calc (NIH): 100 mg/dL — ABNORMAL HIGH (ref 0–99)
Triglycerides: 81 mg/dL (ref 0–149)
VLDL Cholesterol Cal: 15 mg/dL (ref 5–40)

## 2022-04-23 LAB — HCV INTERPRETATION

## 2022-04-23 LAB — FOLLICLE STIMULATING HORMONE: FSH: 148 m[IU]/mL

## 2022-04-23 LAB — HCV AB W REFLEX TO QUANT PCR: HCV Ab: NONREACTIVE

## 2022-04-23 NOTE — Assessment & Plan Note (Signed)
Last menstrual cycle 8 months ago. Not on contraception. No menopausal symptoms. No family history of early menopause. Will obtain [facility] today.

## 2022-05-21 ENCOUNTER — Ambulatory Visit (INDEPENDENT_AMBULATORY_CARE_PROVIDER_SITE_OTHER): Payer: Self-pay | Admitting: Family Medicine

## 2022-05-21 ENCOUNTER — Encounter: Payer: Self-pay | Admitting: Family Medicine

## 2022-05-21 ENCOUNTER — Other Ambulatory Visit: Payer: Self-pay | Admitting: Gastroenterology

## 2022-05-21 VITALS — BP 100/60 | HR 95 | Ht <= 58 in | Wt 107.0 lb

## 2022-05-21 DIAGNOSIS — R1032 Left lower quadrant pain: Secondary | ICD-10-CM

## 2022-05-21 DIAGNOSIS — R109 Unspecified abdominal pain: Secondary | ICD-10-CM

## 2022-05-21 DIAGNOSIS — J029 Acute pharyngitis, unspecified: Secondary | ICD-10-CM

## 2022-05-21 LAB — POCT RAPID STREP A (OFFICE): Rapid Strep A Screen: NEGATIVE

## 2022-05-21 MED ORDER — NA SULFATE-K SULFATE-MG SULF 17.5-3.13-1.6 GM/177ML PO SOLN: ORAL | 0 refills | Status: DC

## 2022-05-21 NOTE — Patient Instructions (Addendum)
The quick test in clinic was negative for strep, but that does not mean that you did not have it since you have exposure.  We are going to get a culture to see if any strep grows that we can treat. In the meantime I would recommend getting over-the-counter throat sprays to help reduce your pain, drink warm fluid and foods other things that have soft textures.  If you are noticing a dry mouth at night, that can make the symptoms worse so I would recommend getting a humidifier.   La prueba rpida en la clnica fue negativa para estreptococos, pero eso no significa que no la haya tenido ya que tiene exposicin.  Vamos a hacer un cultivo para ver si crece algn estreptococo que podamos tratar. Mientras tanto, recomendara obtener aerosoles para la garganta de venta libre para ayudar a Dietitian, beber lquidos tibios y alimentos que tengan texturas suaves.  Si notas sequedad en la boca por la noche, eso puede empeorar los sntomas, por lo que te recomiendo que consigas un humidificador.

## 2022-05-21 NOTE — Progress Notes (Signed)
    SUBJECTIVE:   CHIEF COMPLAINT / HPI:   Throat pain  - Sore throat since yesterday - Pain with swallowing but still able to maintain hydration and eat - Daughter had strep within the last week  - Denies fever, cough, congestion, ear pain, abdominal pain, N/V/D  PERTINENT  PMH / PSH: Reviewed  OBJECTIVE:   BP 100/60   Pulse 95   Ht 4\' 10"  (1.473 m)   Wt 107 lb (48.5 kg)   SpO2 97%   BMI 22.36 kg/m   Gen: well-appearing, NAD CV: RRR, no m/r/g appreciated, no peripheral edema Pulm: CTAB, no wheezes/crackles GI: soft, non-tender, non-distended HEENT: TM clear bilaterally, mild cervical LAD, oropharyngeal erythema, no obvious tonsillar exudates  ASSESSMENT/PLAN:   Sore throat Patient with sore throat x1 day, adequately hydrating with overall reassuring examination. Recent exposure to strep throat with rapid strep in the clinic being negative. Given the symptoms and exposure, we will get a throat culture to determine if antibiotics would be appropriate. - Group A strep throat culture - Return precautions discussed - Conservative management until results of culture   Rise Patience, [place]

## 2022-05-24 LAB — CULTURE, GROUP A STREP: Strep A Culture: NEGATIVE

## 2022-05-29 ENCOUNTER — Encounter (HOSPITAL_COMMUNITY): Payer: Self-pay

## 2022-05-29 ENCOUNTER — Ambulatory Visit (HOSPITAL_COMMUNITY)
Admission: EM | Admit: 2022-05-29 | Discharge: 2022-05-29 | Disposition: A | Discharge status: Home / Self Care | Payer: Self-pay | Attending: Emergency Medicine | Admitting: Emergency Medicine

## 2022-05-29 DIAGNOSIS — H6502 Acute serous otitis media, left ear: Secondary | ICD-10-CM

## 2022-05-29 MED ORDER — AMOXICILLIN 500 MG PO CAPS: 500.0000 mg | ORAL_CAPSULE | Freq: Two times a day (BID) | ORAL | 0 refills | Status: DC

## 2022-05-29 NOTE — Discharge Instructions (Addendum)
Hoy ests siendo tratado por una infeccin en el tmpano.  [place] [address], debera comenzar a ver mejora despus de 48 horas de uso del medicamento y luego debera mejorar progresivamente.  Puede usar Tylenol o ibuprofeno para Estate agent.  Puede colocar compresas tibias en el odo para mayor comodidad.  No intente limpiar los odos ni colocar objetos o lquidos en el canal auditivo para evitar una mayor irritacin.   Today you are being treated for an infection of the eardrum  Take amoxicillin twice daily for 10 days, you should begin to see improvement after 48 hours of medication use and then it should progressively get better  You may use Tylenol or ibuprofen for management of discomfort  May hold warm compresses to the ear for additional comfort  Please not attempted any ear cleaning or object or fluid placement into the ear canal to prevent further irritation

## 2022-05-29 NOTE — ED Triage Notes (Signed)
Pt presents for left ear pain x 2 days. No other symptoms.

## 2022-05-29 NOTE — ED Provider Notes (Signed)
[place]    CSN: [id number] Arrival date & time: 05/29/22  1754      History   Chief Complaint Chief Complaint  Patient presents with   Otalgia    HPI Hannah Orozco Milbert Coulter is a 42 y.o. female.   Patient presents for nasal congestion, body aches, chills and left-sided ear present for 2 days.  No known sick contacts prior.  Tolerating food and liquids.  Has attempted use of Motrin which has been minimally effective.  Denies respiratory history.  Non-smoker.  Shortness of breath or wheezing.    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 Beverlyn Roux - pager [phone]) Please also call Dr. 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 opened up esophagus so she could eat/swallow     OB History     Gravida  3   Para  3   Term  3   Preterm      AB      Living  3      SAB      IAB      Ectopic      Multiple  0   Live Births  3            Home Medications    Prior to Admission medications   Medication Sig Start Date End Date Taking? Authorizing Provider  famotidine (PEPCID) 20 MG tablet TAKE 1 TABLET BY MOUTH TWICE DAILY AS NEEDED FOR HEARTBURN OR INDIGESTION 09/24/19   Patriciaann Clan, DO  ferrous sulfate 325 (65 FE) MG tablet Take 1 tablet (325 mg total) by mouth 2 (two) times daily with a meal. Patient not taking: Reported on 03/30/2022 09/23/17   Kinnie Feil, MD  ibuprofen (ADVIL) 600 MG tablet Take 1 tablet (600 mg total) by mouth every 6 (six) hours as needed. Patient not taking: Reported on 04/22/2022 08/30/20   Margette Fast, MD  Na Sulfate-K Sulfate-Mg Sulf 17.5-3.13-1.6 GM/177ML SOLN Take as written on your Colonoscopy prep instructions 05/21/22   Mansouraty, Telford Nab., MD  naproxen (NAPROSYN) 500 MG tablet Take 1 tablet (500 mg total) by mouth 2 (two) times daily. Patient not taking: Reported on 03/30/2022 06/25/21  Veryl Speak, MD  ondansetron (ZOFRAN) 4 MG tablet Take 1 tablet (4 mg total) by mouth every 8 (eight) hours as needed for nausea or vomiting. Patient not taking: Reported on 03/30/2022 05/19/17   Steve Rattler, DO  ranitidine (ZANTAC) 150 MG tablet Take 1 tablet (150 mg total) by mouth 2 (two) times daily as needed for heartburn. Patient not taking: Reported on 03/30/2022 05/19/17   Steve Rattler, DO  traMADol (ULTRAM) 50 MG tablet Take 1 tablet (50 mg total) by mouth every 6 (six) hours as needed. Patient not taking: Reported on 03/30/2022 06/25/21   Veryl Speak, MD    Family History Family History  Problem Relation Age of Onset   Hypertension Mother    Diabetes Mother    Hypertension Father    Hearing loss Neg Hx    Asthma Neg Hx    Cancer Neg Hx    Heart disease Neg Hx    Stroke Neg Hx    Breast  cancer Neg Hx    Esophageal cancer Neg Hx    Colon cancer Neg Hx    Stomach cancer Neg Hx     Social History Social History   Tobacco Use   Smoking status: Never    Passive exposure: Never   Smokeless tobacco: Never  Vaping Use   Vaping Use: Never used  Substance Use Topics   Alcohol use: No   Drug use: No     Allergies   Patient has no known allergies.   Review of Systems Review of Systems Defer to HPI    Physical Exam Triage Vital Signs ED Triage Vitals [05/29/22 1805]  Enc Vitals Group     BP 111/72     Pulse Rate 86     Resp 18     Temp 98.7 F (37.1 C)     Temp Source Oral     SpO2 98 %     Weight      Height      Head Circumference      Peak Flow      Pain Score      Pain Loc      Pain Edu?      Excl. in [place]?    No data found.  Updated Vital Signs BP 111/72 (BP Location: Left Arm)   Pulse 86   Temp 98.7 F (37.1 C) (Oral)   Resp 18   SpO2 98%   Visual Acuity Right Eye Distance:   Left Eye Distance:   Bilateral Distance:    Right Eye Near:   Left Eye Near:    Bilateral Near:     Physical Exam Constitutional:      Appearance: Normal appearance.  HENT:     Right Ear: Hearing, tympanic membrane, ear canal and external ear normal.     Left Ear: Hearing, ear canal and external ear normal. Tympanic membrane is erythematous.     Nose: Congestion and rhinorrhea present.     Mouth/Throat:     Mouth: Mucous membranes are moist.     Pharynx: No posterior oropharyngeal erythema.  Cardiovascular:     Rate and Rhythm: Normal rate and regular rhythm.     Pulses: Normal pulses.     Heart sounds: Normal heart sounds.  Pulmonary:     Effort: Pulmonary effort is normal.     Breath sounds: Normal breath sounds.  Skin:    General: Skin is warm and dry.  Neurological:     Mental Status:  She is alert and oriented to person, place, and time. Mental status is at baseline.      UC Treatments / Results  Labs (all labs ordered are listed, but  only abnormal results are displayed) Labs Reviewed - No data to display  EKG   Radiology No results found.  Procedures Procedures (including critical care time)  Medications Ordered in UC Medications - No data to display  Initial Impression / Assessment and Plan / UC Course  I have reviewed the triage vital signs and the nursing notes.  Pertinent labs & imaging results that were available during my care of the patient were reviewed by me and considered in my medical decision making (see chart for details).  Nonrecurrent acute serous otitis media of left ear  Significant erythema noted to the left tympanic membrane without abnormality to the canal or external ear, discussed findings with patient, amoxicillin prescribed and recommended over-the-counter analgesics and warm compresses to the external ear for comfort, given suggestions for management of cough and congestion, advised against any ear cleaning until symptoms have resolved and medication is complete, may follow-up with his urgent care as needed, work note given Final Clinical Impressions(s) / UC Diagnoses   Final diagnoses:  None   Discharge Instructions   None    ED Prescriptions   None    PDMP not reviewed this encounter.   Hans Eden, NP 05/30/22 [phone]

## 2022-05-31 ENCOUNTER — Ambulatory Visit
Admission: EM | Admit: 2022-05-31 | Discharge: 2022-05-31 | Disposition: A | Discharge status: Home / Self Care | Payer: Self-pay | Attending: Nurse Practitioner | Admitting: Nurse Practitioner

## 2022-05-31 DIAGNOSIS — J069 Acute upper respiratory infection, unspecified: Secondary | ICD-10-CM

## 2022-05-31 DIAGNOSIS — H66002 Acute suppurative otitis media without spontaneous rupture of ear drum, left ear: Secondary | ICD-10-CM

## 2022-05-31 DIAGNOSIS — R051 Acute cough: Secondary | ICD-10-CM

## 2022-05-31 LAB — POCT INFLUENZA A/B
Influenza A, POC: NEGATIVE
Influenza B, POC: NEGATIVE

## 2022-05-31 MED ORDER — AMOXICILLIN-POT CLAVULANATE 875-125 MG PO TABS: 1.0000 | ORAL_TABLET | Freq: Two times a day (BID) | ORAL | 0 refills | Status: DC

## 2022-05-31 MED ORDER — FLUTICASONE PROPIONATE 50 MCG/ACT NA SUSP: 1.0000 | Freq: Every day | NASAL | 0 refills | Status: DC

## 2022-05-31 NOTE — Discharge Instructions (Addendum)
Stop amoxicillin and start Augmentin Flonase daily Over-the-counter Tylenol or ibuprofen as needed Follow-up with your PCP if symptoms do not improve Please go to the ER for any worsening symptoms

## 2022-05-31 NOTE — ED Triage Notes (Signed)
Pt presents with headaches and ear aches.   States she has taken antibiotics. Reports she had a fever last night and body aches, cough and sore throat. States the ear pain wet away with antibiotics and then came back.

## 2022-05-31 NOTE — ED Provider Notes (Signed)
[facility]    CSN: [id number] Arrival date & time: 05/31/22  1650      History   Chief Complaint Chief Complaint  Patient presents with   Headache    And my hear it hurting a lot  really bad - Entered by patient    HPI Hannah Orozco is a 42 y.o. female  presents for evaluation of URI symptoms for 5 days days.  Interpretation line used as patient only speaks [place].  Patient reports associated symptoms of left ear pain.  States yesterday she developed fever, cough, congestion, body aches.  States she did not have the symptoms at the time of her ER pain development.  Denies N/V/D, sore throat, shortness of breath. Patient does not have a hx of asthma or smoking. No known sick contacts.  Patient was seen in urgent care on 3/16 for 2 days of ear pain and URI symptoms.  She was diagnosed with serous otitis media of the left ear and started on amoxicillin.  Patient states she has been taking as prescribed with no improvement.  Continues to have ear pain.  Pt has taken ibuprofen OTC for symptoms. Pt has no other concerns at this time.    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 Beverlyn Roux - pager [phone]) Please also call Dr. 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 opened up esophagus so she could eat/swallow    OB History     Gravida  3   Para  3   Term  3   Preterm      AB      Living  3      SAB      IAB      Ectopic      Multiple  0   Live Births  3            Home Medications    Prior to Admission medications   Medication Sig Start Date End Date Taking? Authorizing Provider  fluticasone (FLONASE) 50 MCG/ACT nasal spray Place 1 spray into both nostrils daily. 05/31/22  Yes Melynda Ripple, NP  amoxicillin-clavulanate (  AUGMENTIN) 875-125 MG tablet Take 1 tablet by mouth every 12 (twelve) hours for 10 days. 05/31/22 06/10/22  Melynda Ripple, NP  famotidine (PEPCID) 20 MG tablet TAKE 1 TABLET BY MOUTH TWICE DAILY AS NEEDED FOR HEARTBURN OR INDIGESTION 09/24/19   Patriciaann Clan, DO  ferrous sulfate 325 (65 FE) MG tablet Take 1 tablet (325 mg total) by mouth 2 (two) times daily with a meal. Patient not taking: Reported on 03/30/2022 09/23/17   Kinnie Feil, MD  ibuprofen (ADVIL) 600 MG tablet Take 1 tablet (600 mg total) by mouth every 6 (six) hours as needed. Patient not taking: Reported on 04/22/2022 08/30/20   Margette Fast, MD  Na Sulfate-K Sulfate-Mg Sulf 17.5-3.13-1.6 GM/177ML SOLN Take as written on your Colonoscopy prep instructions 05/21/22   Mansouraty, Telford Nab., MD  naproxen (NAPROSYN) 500 MG tablet Take 1 tablet (500 mg total) by mouth 2 (two) times daily. Patient not taking:  Reported on 03/30/2022 06/25/21   Veryl Speak, MD  ondansetron (ZOFRAN) 4 MG tablet Take 1 tablet (4 mg total) by mouth every 8 (eight) hours as needed for nausea or vomiting. Patient not taking: Reported on 03/30/2022 05/19/17   Steve Rattler, DO  ranitidine (ZANTAC) 150 MG tablet Take 1 tablet (150 mg total) by mouth 2 (two) times daily as needed for heartburn. Patient not taking: Reported on 03/30/2022 05/19/17   Steve Rattler, DO  traMADol (ULTRAM) 50 MG tablet Take 1 tablet (50 mg total) by mouth every 6 (six) hours as needed. Patient not taking: Reported on 03/30/2022 06/25/21   Veryl Speak, MD    Family History Family History  Problem Relation Age of Onset   Hypertension Mother    Diabetes Mother    Hypertension Father    Hearing loss Neg Hx    Asthma Neg Hx    Cancer Neg Hx    Heart disease Neg Hx    Stroke Neg Hx    Breast cancer Neg Hx    Esophageal cancer Neg Hx    Colon cancer Neg Hx    Stomach cancer Neg Hx     Social History Social History   Tobacco Use   Smoking status: Never    Passive exposure: Never   Smokeless tobacco: Never  Vaping Use   Vaping Use: Never used  Substance Use Topics   Alcohol use: No   Drug use: No     Allergies   Patient has no known allergies.   Review of Systems Review of Systems  Constitutional:  Positive for fever.  HENT:  Positive for congestion and ear pain.   Respiratory:  Positive for cough.   Neurological:  Positive for headaches.     Physical Exam Triage Vital Signs ED Triage Vitals  Enc Vitals Group     BP 05/31/22 1717 101/69     Pulse Rate 05/31/22 1717 72     Resp 05/31/22 1717 19     Temp 05/31/22 1717 98.1 F (36.7 C)     Temp Source 05/31/22 1717 Oral     SpO2 05/31/22 1717 98 %     Weight --      Height --      Head Circumference --      Peak Flow --      Pain Score 05/31/22 1716 6     Pain Loc --      Pain Edu? --      Excl. in GC? --    No  data found.  Updated Vital Signs BP 101/69  (BP Location: Left Arm)   Pulse 72   Temp 98.1 F (36.7 C) (Oral)   Resp 19   LMP  (LMP Unknown)   SpO2 98%   Visual Acuity Right Eye Distance:   Left Eye Distance:   Bilateral Distance:    Right Eye Near:   Left Eye Near:    Bilateral Near:     Physical Exam Vitals and nursing note reviewed.  Constitutional:      General: She is not in acute distress.    Appearance: She is well-developed. She is not ill-appearing.  HENT:     Head: Normocephalic and atraumatic.     Right Ear: Tympanic membrane and ear canal normal.     Left Ear: Ear canal normal. A middle ear effusion is present. Tympanic membrane is erythematous.     Nose: Congestion present.     Mouth/Throat:     Mouth: Mucous membranes are moist.     Pharynx: Oropharynx is clear. Uvula midline. No oropharyngeal exudate or posterior oropharyngeal erythema.     Tonsils: No tonsillar exudate or tonsillar abscesses.  Eyes:     Conjunctiva/sclera: Conjunctivae normal.     Pupils: Pupils are equal, round, and reactive to light.  Cardiovascular:     Rate and Rhythm: Normal rate and regular rhythm.     Heart sounds: Normal heart sounds.  Pulmonary:     Effort: Pulmonary effort is normal.     Breath sounds: Normal breath sounds.  Musculoskeletal:     Cervical back: Normal range of motion and neck supple.  Lymphadenopathy:     Cervical: No cervical adenopathy.  Skin:    General: Skin is warm and dry.  Neurological:     General: No focal deficit present.     Mental Status: She is alert and oriented to person, place, and time.  Psychiatric:        Mood and Affect: Mood normal.        Behavior: Behavior normal.      UC Treatments / Results  Labs (all labs ordered are listed, but only abnormal results are displayed) Labs Reviewed  POCT INFLUENZA A/B    EKG   Radiology No results found.  Procedures Procedures (including critical care time)  Medications Ordered in UC Medications - No data to  display  Initial Impression / Assessment and Plan / UC Course  I have reviewed the triage vital signs and the nursing notes.  Pertinent labs & imaging results that were available during my care of the patient were reviewed by me and considered in my medical decision making (see chart for details).     Negative rapid flu Discussed symptoms with patient.  Patient placed on low-dose amoxicillin for ear infection with no improvement.  Patient will stop this and start Augmentin twice daily for 10 days Flonase daily Continue over-the-counter Tylenol or ibuprofen as needed OTC cough medicine as needed Rest and fluids Follow-up with PCP if symptoms do not improve ER precautions reviewed and Patient verbalized understanding Final Clinical Impressions(s) / UC Diagnoses   Final diagnoses:  Acute cough  Acute suppurative otitis media of left ear without spontaneous rupture of tympanic membrane, recurrence not specified  Viral URI     Discharge Instructions      Stop amoxicillin and start Augmentin Flonase daily Over-the-counter Tylenol or ibuprofen as needed Follow-up with your PCP if symptoms do not improve Please go to the ER for any worsening  symptoms      ED Prescriptions     Medication Sig Dispense Auth. Provider   amoxicillin-clavulanate (AUGMENTIN) 875-125 MG tablet  (Status: Discontinued) Take 1 tablet by mouth every 12 (twelve) hours for 10 days. 20 tablet Melynda Ripple, NP   fluticasone (FLONASE) 50 MCG/ACT nasal spray Place 1 spray into both nostrils daily. 15.8 mL Melynda Ripple, NP   amoxicillin-clavulanate (AUGMENTIN) 875-125 MG tablet Take 1 tablet by mouth every 12 (twelve) hours for 10 days. 20 tablet Melynda Ripple, NP      PDMP not reviewed this encounter.   Melynda Ripple, NP 05/31/22 [phone]

## 2022-06-01 ENCOUNTER — Other Ambulatory Visit: Payer: Self-pay | Admitting: Gastroenterology

## 2022-06-01 ENCOUNTER — Other Ambulatory Visit: Payer: Self-pay

## 2022-06-01 DIAGNOSIS — R109 Unspecified abdominal pain: Secondary | ICD-10-CM

## 2022-06-01 DIAGNOSIS — R1032 Left lower quadrant pain: Secondary | ICD-10-CM

## 2022-06-01 MED ORDER — NA SULFATE-K SULFATE-MG SULF 17.5-3.13-1.6 GM/177ML PO SOLN
ORAL | 0 refills | Status: DC
  Filled 2022-06-01: qty 354, 2d supply, fill #0

## 2022-06-02 ENCOUNTER — Ambulatory Visit (INDEPENDENT_AMBULATORY_CARE_PROVIDER_SITE_OTHER): Payer: Self-pay | Admitting: Family Medicine

## 2022-06-02 ENCOUNTER — Other Ambulatory Visit (HOSPITAL_COMMUNITY)
Admission: RE | Admit: 2022-06-02 | Discharge: 2022-06-02 | Disposition: A | Discharge status: Home / Self Care | Payer: Self-pay | Source: Ambulatory Visit | Attending: Family Medicine | Admitting: Family Medicine

## 2022-06-02 ENCOUNTER — Encounter: Payer: Self-pay | Admitting: Family Medicine

## 2022-06-02 ENCOUNTER — Other Ambulatory Visit: Payer: Self-pay | Admitting: Family Medicine

## 2022-06-02 ENCOUNTER — Other Ambulatory Visit: Payer: Self-pay

## 2022-06-02 ENCOUNTER — Ambulatory Visit: Payer: Self-pay

## 2022-06-02 ENCOUNTER — Encounter: Payer: Self-pay | Admitting: Gastroenterology

## 2022-06-02 VITALS — BP 95/54 | HR 85 | Temp 98.1°F | Wt 109.0 lb

## 2022-06-02 DIAGNOSIS — H6692 Otitis media, unspecified, left ear: Secondary | ICD-10-CM

## 2022-06-02 DIAGNOSIS — J029 Acute pharyngitis, unspecified: Secondary | ICD-10-CM

## 2022-06-02 MED ORDER — CEFDINIR 300 MG PO CAPS
300.0000 mg | ORAL_CAPSULE | Freq: Two times a day (BID) | ORAL | 0 refills | Status: DC
  Filled 2022-06-02: qty 20, 10d supply, fill #0

## 2022-06-02 NOTE — Patient Instructions (Addendum)
It was wonderful to see you today.  Please bring ALL of your medications with you to every visit.   Today we talked about:  - Stop Augmentin   - Start the new antibiotic that I have sent to your pharmacy  -- Continue your nasal spray   I have referred you to Ear nose and throat  to further evaluate your concern. If you do not received a phone call about this appointment within 2 weeks, please call our office back at [phone]. Jazmin Hartsell coordinates our referrals and can assist you in this.     Please follow up in 1 week   Thank you for choosing [facility].   Please call [phone] with any questions about today's appointment.  Please be sure to schedule follow up at the front  desk before you leave today.   Dorris Singh, MD  Family Medicine

## 2022-06-02 NOTE — Progress Notes (Signed)
    SUBJECTIVE:   CHIEF COMPLAINT: sore throat HPI:   Hannah Orozco is a 42 y.o.  with history notable for GERD and recent visit for sore throat presenting for pharyngitis. The patient speaks Spanish as their primary language.  An interpreter was used for the entire visit.  .   She reports since Thursday she has had ear pain, headache, and sore throat. She was prescribed an antibiotic. She reports persistent feeling cold, sore throat, era.  She has tried antibiotics, flonase, ibuprofen. She was initially given amoxicillin then Augmentin on 3/18. She has taken 4 doses of Augmentin without any improvement.  Other symptoms include pain with swallowing at times. Able to eat and drink well. Also has chills at times. Denies dizziness, chest pain, dyspnea, dental issues. Sexually active with 1 partner, her husband. Does not have oral sex. Prior testing includes a negative flu and strep culture (February)   PERTINENT  PMH / PSH/Family/Social History : GERD, ?chronic abdominal pain   OBJECTIVE:   BP (!) 95/54 (BP Location: Right Arm, Patient Position: Sitting)   Pulse 85   Temp 98.1 F (36.7 C)   Wt 109 lb (49.4 kg)   LMP  (LMP Unknown)   BMI 22.78 kg/m   Today's weight:  Last Weight  Most recent update: 06/02/2022  2:51 PM    Weight  49.4 kg (109 lb)            Review of prior weights: Filed Weights   06/02/22 1451  Weight: 109 lb (49.4 kg)   HEENT: EOMI. Sclera without injection or icterus. MMM. External auditory canal examined and WNL. TM normal appearance, no erythema or bulging on R, L TM erythematous, bulging with purulent discharge Neck: Supple. No LAD  Full ROM No pain with movement of neck  Dentition is poor Oropharynx--small tonsils, no exudate, midline uvula, no edema  No tenderness over her mastoid no erythema over her mastoid Cardiac: Regular rate and rhythm. Normal S1/S2. No murmurs, rubs, or gallops appreciated. Lungs: Clear bilaterally to ascultation.    ASSESSMENT/PLAN:   Pharyngitis with left otitis media, acute that has been refractory to typical antibiotic therapy No high risk behaviors suggestive of HIV or other immunosuppression condition ---she takes no immunosuppressive medications. Suspect this may be a resistant organism will transition to cefdinir.  Given the duration of her pharyngitis she has had several recurrent episodes of this since February we will refer her to ear nose and throat She can continue ibuprofen and nasal sprays I have scheduled follow-up for her in 1 week to check on her symptoms Although unlikely collected GC chlamydia of the throat today per patient preference  Low blood pressure Denies dizziness or symptoms.  She is drinking well-- she reports voiding a normal amount.  Appears her diastolics run relatively low continue to monitor at this point.    Dorris Singh, [place]

## 2022-06-03 LAB — CERVICOVAGINAL ANCILLARY ONLY
Chlamydia: NEGATIVE
Comment: NEGATIVE
Comment: NORMAL
Neisseria Gonorrhea: NEGATIVE

## 2022-06-07 ENCOUNTER — Telehealth: Payer: Self-pay | Admitting: Gastroenterology

## 2022-06-07 NOTE — Telephone Encounter (Signed)
Pt is spanish speaking, scheduler Sophia L. called patient to translate that she could continue to take her antibiotics, and to take her dose tomorrow after her procedure so that she could take her medicine with food. Pt verbalized understanding, and had no further concerns at the end of the call with Sophia.

## 2022-06-07 NOTE — Telephone Encounter (Signed)
Inbound call from patient states she has an ear infection and has been prescribed antibiotics. Patient has a procedure tomorrow morning, would like to know if it is okay to proceed with procedure. Patient is spanish speaking.

## 2022-06-08 ENCOUNTER — Ambulatory Visit (AMBULATORY_SURGERY_CENTER): Payer: Self-pay | Admitting: Gastroenterology

## 2022-06-08 ENCOUNTER — Other Ambulatory Visit: Payer: Self-pay

## 2022-06-08 ENCOUNTER — Encounter: Payer: Self-pay | Admitting: Gastroenterology

## 2022-06-08 VITALS — BP 99/42 | HR 76 | Temp 99.6°F | Resp 18 | Ht <= 58 in | Wt 108.0 lb

## 2022-06-08 DIAGNOSIS — R109 Unspecified abdominal pain: Secondary | ICD-10-CM

## 2022-06-08 DIAGNOSIS — R1032 Left lower quadrant pain: Secondary | ICD-10-CM

## 2022-06-08 MED ORDER — SODIUM CHLORIDE 0.9 % IV SOLN: 500.0000 mL | Freq: Once | INTRAVENOUS | Status: DC

## 2022-06-08 MED ORDER — DICYCLOMINE HCL 10 MG PO CAPS
10.0000 mg | ORAL_CAPSULE | ORAL | 6 refills | Status: DC
  Filled 2022-06-08: qty 30, 10d supply, fill #0
  Filled 2023-05-04 (×2): qty 30, 10d supply, fill #1

## 2022-06-08 NOTE — Patient Instructions (Signed)
Discharge instructions given. Handout on Hemorrhoids. Prescription sent to pharmacy. Follow up appointment scheduled. See Recommendations on report. YOU HAD AN ENDOSCOPIC PROCEDURE TODAY AT [facility]:   Refer to the procedure report that was given to you for any specific questions about what was found during the examination.  If the procedure report does not answer your questions, please call your gastroenterologist to clarify.  If you requested that your care partner not be given the details of your procedure findings, then the procedure report has been included in a sealed envelope for you to review at your convenience later.  YOU SHOULD EXPECT: Some feelings of bloating in the abdomen. Passage of more gas than usual.  Walking can help get rid of the air that was put into your GI tract during the procedure and reduce the bloating. If you had a lower endoscopy (such as a colonoscopy or flexible sigmoidoscopy) you may notice spotting of blood in your stool or on the toilet paper. If you underwent a bowel prep for your procedure, you may not have a normal bowel movement for a few days.  Please Note:  You might notice some irritation and congestion in your nose or some drainage.  This is from the oxygen used during your procedure.  There is no need for concern and it should clear up in a day or so.  SYMPTOMS TO REPORT IMMEDIATELY:  Following lower endoscopy (colonoscopy or flexible sigmoidoscopy):  Excessive amounts of blood in the stool  Significant tenderness or worsening of abdominal pains  Swelling of the abdomen that is new, acute  Fever of 100F or higher   For urgent or emergent issues, a gastroenterologist can be reached at any hour by calling [phone]. Do not use MyChart messaging for urgent concerns.    DIET:  We do recommend a small meal at first, but then you may proceed to your regular diet.  Drink plenty of fluids but you should avoid alcoholic beverages  for 24 hours.  ACTIVITY:  You should plan to take it easy for the rest of today and you should NOT DRIVE or use heavy machinery until tomorrow (because of the sedation medicines used during the test).    FOLLOW UP: Our staff will call the number listed on your records the next business day following your procedure.  We will call around 7:15- 8:00 am to check on you and address any questions or concerns that you may have regarding the information given to you following your procedure. If we do not reach you, we will leave a message.     If any biopsies were taken you will be contacted by phone or by letter within the next 1-3 weeks.  Please call us at [phone] if you have not heard about the biopsies in 3 weeks.    SIGNATURES/CONFIDENTIALITY: You and/or your care partner have signed paperwork which will be entered into your electronic medical record.  These signatures attest to the fact that that the information above on your After Visit Summary has been reviewed and is understood.  Full responsibility of the confidentiality of this discharge information lies with you and/or your care-partner.

## 2022-06-08 NOTE — Progress Notes (Signed)
Report to PACU, RN, vss, BBS= Clear.  

## 2022-06-08 NOTE — Progress Notes (Signed)
GASTROENTEROLOGY PROCEDURE H&P NOTE   Primary Care Physician: Shary Key, DO  HPI: Hannah Orozco is a 42 y.o. female who presents for Colonoscopy for evaluation of LLQ pain.  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 Beverlyn Roux - pager [phone]) Please also call Dr. Chrisandra Netters (cell [phone], pager [phone]) for delivery  Genetic Screen  declined  Anatomic [place]  normal female  Glucose Screen  90  GBS    Feeding Preference  Breast and bottle  Contraception  undecided  Circumcision  n/a  Pap  09/2012  Other  size < dates, but [place] at 36w shows estimated weight at 20th %   Thrombocytopenia [facility])    Past Surgical History:  Procedure Laterality Date   ESOPHAGUS SURGERY  ~2004   laparoscopic, ? opened up esophagus so she could eat/swallow   Current Outpatient Medications  Medication Sig Dispense Refill   cefdinir (OMNICEF) 300 MG capsule Take 1 capsule (300 mg total) by mouth 2 (two) times daily. 20 capsule 0   famotidine (PEPCID) 20 MG tablet TAKE 1 TABLET BY MOUTH TWICE DAILY AS NEEDED FOR HEARTBURN OR INDIGESTION 30 tablet 4   ferrous sulfate 325 (65 FE) MG tablet Take 1 tablet (325 mg total) by mouth 2 (two) times daily with a meal. (Patient not taking: Reported on 03/30/2022) 60 tablet 0   fluticasone (FLONASE) 50 MCG/ACT nasal spray Place 1 spray into both nostrils daily. 15.8 mL 0   ibuprofen (ADVIL) 600 MG tablet Take 1 tablet (600 mg total) by mouth every 6 (six)  hours as needed. (Patient not taking: Reported on 04/22/2022) 30 tablet 0   Na Sulfate-K Sulfate-Mg Sulf 17.5-3.13-1.6 GM/177ML SOLN Take as written on your Colonoscopy prep instructions 354 mL 0   No current facility-administered medications for this visit.    Current Outpatient Medications:    cefdinir (OMNICEF) 300 MG capsule, Take 1 capsule (300 mg total) by mouth 2 (two) times daily., Disp: 20 capsule, Rfl: 0   famotidine (PEPCID) 20 MG tablet, TAKE 1 TABLET BY MOUTH TWICE DAILY AS NEEDED FOR HEARTBURN OR INDIGESTION, Disp: 30 tablet, Rfl: 4   ferrous sulfate 325 (65 FE) MG tablet, Take 1 tablet (325 mg total) by mouth 2 (two) times daily with a meal. (Patient not taking: Reported on 03/30/2022), Disp: 60 tablet, Rfl: 0   fluticasone (FLONASE) 50 MCG/ACT nasal spray, Place 1 spray into both nostrils daily., Disp: 15.8 mL, Rfl: 0   ibuprofen (ADVIL) 600 MG tablet, Take 1 tablet (600 mg total) by mouth every 6 (six) hours as needed. (Patient not taking: Reported on 04/22/2022), Disp: 30 tablet, Rfl: 0   Na Sulfate-K Sulfate-Mg Sulf 17.5-3.13-1.6 GM/177ML SOLN, Take as written on your Colonoscopy prep instructions, Disp: 354 mL, Rfl: 0 No Known Allergies Family History  Problem Relation Age of Onset   Hypertension Mother    Diabetes Mother    Hypertension Father    Hearing loss Neg Hx  Asthma Neg Hx    Cancer Neg Hx    Heart disease Neg Hx    Stroke Neg Hx    Breast cancer Neg Hx    Esophageal cancer Neg Hx    Colon cancer Neg Hx    Stomach cancer Neg Hx    Social History   Socioeconomic History   Marital status: Significant Other    Spouse name: Not on file   Number of children: 3   Years of education: Not on file   Highest education level: Not on file  Occupational History   Not on file  Tobacco Use   Smoking status: Never    Passive exposure: Never   Smokeless tobacco: Never  Vaping Use   Vaping Use: Never used  Substance and Sexual Activity   Alcohol use: No   Drug  use: No   Sexual activity: Yes    Birth control/protection: None  Other Topics Concern   Not on file  Social History Narrative   Married. Works Education administrator houses.    Social Determinants of Health   Financial Resource Strain: Not on file  Food Insecurity: No Food Insecurity (11/19/2021)   Hunger Vital Sign    Worried About Running Out of Food in the Last Year: Never true    Ran Out of Food in the Last Year: Never true  Transportation Needs: No Transportation Needs (11/19/2021)   PRAPARE - Hydrologist (Medical): No    Lack of Transportation (Non-Medical): No  Physical Activity: Not on file  Stress: Stress Concern Present (12/18/2020)   [place]    Feeling of Stress : Rather much  Social Connections: Not on file  Intimate Partner Violence: Not on file    Physical Exam: There were no vitals filed for this visit. There is no height or weight on file to calculate BMI. GEN: NAD EYE: Sclerae anicteric ENT: MMM CV: Non-tachycardic GI: Soft, NT/ND NEURO:  Alert & Oriented x 3  Lab Results: No results for input(s): "WBC", "HGB", "HCT", "PLT" in the last 72 hours. BMET No results for input(s): "NA", "K", "CL", "CO2", "GLUCOSE", "BUN", "CREATININE", "CALCIUM" in the last 72 hours. LFT No results for input(s): "PROT", "ALBUMIN", "AST", "ALT", "ALKPHOS", "BILITOT", "BILIDIR", "IBILI" in the last 72 hours. PT/INR No results for input(s): "LABPROT", "INR" in the last 72 hours.   Impression / Plan: This is a 42 y.o.female who presents for Colonoscopy for evaluation of LLQ pain.  The risks and benefits of endoscopic evaluation/treatment were discussed with the patient and/or family; these include but are not limited to the risk of perforation, infection, bleeding, missed lesions, lack of diagnosis, severe illness requiring hospitalization, as well as anesthesia and sedation related illnesses.  The  patient's history has been reviewed, patient examined, no change in status, and deemed stable for procedure.  The patient and/or family is agreeable to proceed.    Justice Britain, MD [facility] Office # [id number]

## 2022-06-08 NOTE — Progress Notes (Signed)
Pt's states no medical or surgical changes since previsit or office visit. 

## 2022-06-08 NOTE — Op Note (Signed)
[place] Patient Name: Hannah Orozco Procedure Date: 06/08/2022 9:59 AM MRN: [id number] Endoscopist: Justice Britain , MD, [id number] Age: 42 Referring MD:  Date of Birth: [birth year] Gender: Female Account #: [id number] Procedure:                Colonoscopy Indications:              Intermittent abdominal pain in the left lower                            quadrant with previous negative CT scans Medicines:                Monitored Anesthesia Care Procedure:                Pre-Anesthesia Assessment:                           - Prior to the procedure, a History and Physical                            was performed, and patient medications and                            allergies were reviewed. The patient's tolerance of                            previous anesthesia was also reviewed. The risks                            and benefits of the procedure and the sedation                            options and risks were discussed with the patient.                            All questions were answered, and informed consent                            was obtained. Prior Anticoagulants: The patient has                            taken no anticoagulant or antiplatelet agents                            except for NSAID medication. ASA Grade Assessment:                            II - A patient with mild systemic disease. After                            reviewing the risks and benefits, the patient was                            deemed in satisfactory condition to undergo the  procedure.                           After obtaining informed consent, the colonoscope                            was passed under direct vision. Throughout the                            procedure, the patient's blood pressure, pulse, and                            oxygen saturations were monitored continuously. The                            CF [id number] was introduced  through the anus                            and advanced to the 5 cm into the ileum. The                            colonoscopy was somewhat difficult due to                            significant looping. Successful completion of the                            procedure was aided by changing the patient's                            position, using manual pressure, straightening and                            shortening the scope to obtain bowel loop reduction                            and using scope torsion. The patient tolerated the                            procedure. The quality of the bowel preparation was                            good. The terminal ileum, ileocecal valve,                            appendiceal orifice, and rectum were photographed. Scope In: 10:12:55 AM Scope Out: 10:21:26 AM Scope Withdrawal Time: 0 hours 5 minutes 8 seconds  Total Procedure Duration: 0 hours 8 minutes 31 seconds  Findings:                 The digital rectal exam was normal. Pertinent                            negatives include no palpable rectal lesions.  The terminal ileum and ileocecal valve appeared                            normal.                           Normal mucosa was found in the entire colon.                           Non-bleeding non-thrombosed internal hemorrhoids                            were found during retroflexion. The hemorrhoids                            were Grade I (internal hemorrhoids that do not                            prolapse). Complications:            No immediate complications. Estimated Blood Loss:     Estimated blood loss: none. Impression:               - The examined portion of the ileum was normal.                           - Normal mucosa in the entire examined colon.                           - Non-bleeding non-thrombosed internal hemorrhoids. Recommendation:           - The patient will be observed post-procedure,                             until all discharge criteria are met.                           - Discharge patient to home.                           - Patient has a contact number available for                            emergencies. The signs and symptoms of potential                            delayed complications were discussed with the                            patient. Return to normal activities tomorrow.                            Written discharge instructions were provided to the                            patient.                           -  High fiber diet.                           - Use FiberCon 1-2 tablets PO daily.                           - Continue present medications.                           - The etiology of the patient's intermittent left                            lower quadrant abdominal pain is not clearly noted                            at this point in time with negative CT scans and                            colonoscopy. Recommend consideration of Bentyl 10                            mg 1-3 times daily as needed and use at time of                            pain but can hold on use if not having pain) as                            needed to see at the time of patient having pain if                            that is helpful.                           - If not helpful, consider TCA use in future                            depending on patient's overall pain/discomfort                            levels moving forward.                           - Follow-up in clinic will be arranged.                           - The findings and recommendations were discussed                            with the patient.                           - The findings and recommendations were discussed  with the patient's family. Justice Britain, MD 06/08/2022 10:27:09 AM

## 2022-06-09 ENCOUNTER — Telehealth: Payer: Self-pay

## 2022-06-09 NOTE — Telephone Encounter (Signed)
  Follow up Call-     06/08/2022    9:55 AM  Call back number  Post procedure Call Back phone  # [phone]  Permission to leave phone message Yes     Patient questions:  Do you have a fever, pain , or abdominal swelling? No. Pain Score  0 *  Have you tolerated food without any problems? Yes.    Have you been able to return to your normal activities? Yes.    Do you have any questions about your discharge instructions: Diet   No. Medications  No. Follow up visit  No.  Do you have questions or concerns about your Care? No.  Actions: * If pain score is 4 or above: No action needed, pain <4.

## 2022-06-10 ENCOUNTER — Other Ambulatory Visit: Payer: Self-pay

## 2022-06-10 ENCOUNTER — Ambulatory Visit (INDEPENDENT_AMBULATORY_CARE_PROVIDER_SITE_OTHER): Payer: Self-pay | Admitting: Family Medicine

## 2022-06-10 ENCOUNTER — Encounter: Payer: Self-pay | Admitting: Family Medicine

## 2022-06-10 VITALS — BP 95/61 | HR 76 | Ht <= 58 in | Wt 107.6 lb

## 2022-06-10 DIAGNOSIS — H65195 Other acute nonsuppurative otitis media, recurrent, left ear: Secondary | ICD-10-CM

## 2022-06-10 LAB — POCT GLYCOSYLATED HEMOGLOBIN (HGB A1C): Hemoglobin A1C: 5.4 % (ref 4.0–5.6)

## 2022-06-10 NOTE — Patient Instructions (Addendum)
It was great seeing you today!  Im sorry you have still been having ear pain. Your ear still looks infected. Continue Cefdinir, flonase, and you can use Zyrtec daily to also help with the congestion.   You can try contacting the ear nose and throat specialist  Su Raynelle Bring, MD, PA Address: [address], [place], [place] [address] Phone: [phone]  We also checked your A1c which was low at 5.4  Feel free to call with any questions or concerns at any time, at [phone].   Take care,  Dr. Shary Key [facility]  Fue genial verte hoy!  Lamento que todava tengas dolor de odo. Tu odo todava parece infectado. Contine con Cefdinir, flonasa y puede usar Zyrtec a diario para ayudar tambin con la congestin.  Puedes intentar contactar con el otorrinolaringlogo. Su Raynelle Bring, MD, PA [address]: [address], [place], [place] [address] Telfono: [phone]  Tambin revisamos su A1c, que estaba bajo en 5,4.  Si tiene alguna pregunta o inquietud, no dude en llamarnos en cualquier momento al [phone].   Cuidarse, [place] familiar [place]

## 2022-06-10 NOTE — Progress Notes (Signed)
    SUBJECTIVE:   CHIEF COMPLAINT / HPI:   Patient presents for continued eft ear pain.   Was seen in clinic on 3/20 for pharyngitis and L OM was switched from Amoxicillin to Augmentin to cefdinir. Discussed that she would be referred to ENT at that time. Recommended to follow up in 1 week to check on symptoms  She reports ongoing ear pain, Primarily on left side. Ear feels clogged. Also endorses some right sided discomfort now. Does not feel like it has improved. Still having some throat discomfort. Denies fever or chills since first getting the infection. Able to eat and drink   PERTINENT  PMH / PSH: Reviewed   OBJECTIVE:   BP 95/61   Pulse 76   Ht 4\' 10"  (1.473 m)   Wt 107 lb 9.6 oz (48.8 kg)   LMP  (LMP Unknown) Comment: no period  SpO2 98%   BMI 22.49 kg/m    Physical exam General: well appearing, NAD HEENT: L TM erythematous and bulging without purulent discharge. R TM normal Cardiovascular: RRR, no murmurs Lungs: CTAB. Normal WOB Abdomen: soft, non-distended, non-tender Skin: warm, dry. No edema  ASSESSMENT/PLAN:   Otitis media Patient presents with continued ear pain despite finishing third antibiotic (Cefdinir). On exam L TM erythematous and bulging without purulent discharge which seems to be improving from 1 week ago though surprising that after 3 different antibiotics she still has an infection. A1c checked given abx resistance and was normal at 5.4, Discussed following up with ENT and continuing OTC pain medication as well as nasal spray    [place]

## 2022-06-12 DIAGNOSIS — H6690 Otitis media, unspecified, unspecified ear: Secondary | ICD-10-CM | POA: Insufficient documentation

## 2022-06-12 NOTE — Assessment & Plan Note (Signed)
Patient presents with continued ear pain despite finishing third antibiotic (Cefdinir). On exam L TM erythematous and bulging without purulent discharge which seems to be improving from 1 week ago though surprising that after 3 different antibiotics she still has an infection. A1c checked given abx resistance and was normal at 5.4, Discussed following up with ENT and continuing OTC pain medication as well as nasal spray

## 2022-06-14 ENCOUNTER — Encounter: Payer: Self-pay | Admitting: Family Medicine

## 2022-06-16 ENCOUNTER — Inpatient Hospital Stay: Payer: Self-pay

## 2022-06-16 ENCOUNTER — Other Ambulatory Visit: Payer: Self-pay

## 2022-06-16 DIAGNOSIS — Z0000 Encounter for general adult medical examination without abnormal findings: Secondary | ICD-10-CM

## 2022-07-22 ENCOUNTER — Ambulatory Visit (INDEPENDENT_AMBULATORY_CARE_PROVIDER_SITE_OTHER): Payer: Self-pay | Admitting: Nurse Practitioner

## 2022-07-22 ENCOUNTER — Other Ambulatory Visit: Payer: Self-pay

## 2022-07-22 ENCOUNTER — Other Ambulatory Visit (INDEPENDENT_AMBULATORY_CARE_PROVIDER_SITE_OTHER): Payer: Self-pay

## 2022-07-22 ENCOUNTER — Encounter: Payer: Self-pay | Admitting: Nurse Practitioner

## 2022-07-22 VITALS — BP 98/60 | HR 72 | Ht <= 58 in | Wt 112.0 lb

## 2022-07-22 DIAGNOSIS — R1032 Left lower quadrant pain: Secondary | ICD-10-CM

## 2022-07-22 DIAGNOSIS — D649 Anemia, unspecified: Secondary | ICD-10-CM

## 2022-07-22 LAB — IBC + FERRITIN
Ferritin: 18 ng/mL (ref 10.0–291.0)
Iron: 94 ug/dL (ref 42–145)
Saturation Ratios: 27.7 % (ref 20.0–50.0)
TIBC: 338.8 ug/dL (ref 250.0–450.0)
Transferrin: 242 mg/dL (ref 212.0–360.0)

## 2022-07-22 LAB — CBC
HCT: 34.1 % — ABNORMAL LOW (ref 36.0–46.0)
Hemoglobin: 11.3 g/dL — ABNORMAL LOW (ref 12.0–15.0)
MCHC: 33.2 g/dL (ref 30.0–36.0)
MCV: 93.3 fl (ref 78.0–100.0)
Platelets: 149 10*3/uL — ABNORMAL LOW (ref 150.0–400.0)
RBC: 3.66 Mil/uL — ABNORMAL LOW (ref 3.87–5.11)
RDW: 14.2 % (ref 11.5–15.5)
WBC: 4.2 10*3/uL (ref 4.0–10.5)

## 2022-07-22 LAB — VITAMIN B12: Vitamin B-12: 508 pg/mL (ref 211–911)

## 2022-07-22 MED ORDER — DICYCLOMINE HCL 10 MG PO CAPS
10.0000 mg | ORAL_CAPSULE | Freq: Two times a day (BID) | ORAL | 1 refills | Status: DC
  Filled 2022-07-22: qty 90, 30d supply, fill #0
  Filled 2022-07-29: qty 90, 45d supply, fill #0
  Filled 2022-07-29: qty 90, 30d supply, fill #0

## 2022-07-22 NOTE — Patient Instructions (Addendum)
Su proveedor le ha solicitado que vaya al stano para Education officer, environmental anlisis de laboratorio antes de irse hoy. Presione "B" en el ascensor. El laboratorio est ubicado en la primera puerta a la izquierda al salir del Medical sales representative.  Haz un seguimiento con tu gineclogo.  Hemos enviado los siguientes medicamentos a su farmacia para que los recoja cuando le convenga: Diciclomina 10 mg 1 por va oral dos veces al [employer]. Puede tomarlos tres veces al da si es necesario.  Due to recent changes in healthcare laws, you may see the results of your imaging and laboratory studies on MyChart before your provider has had a chance to review them.  We understand that in some cases there may be results that are confusing or concerning to you. Not all laboratory results come back in the same time frame and the provider may be waiting for multiple results in order to interpret others.  Please give [place] 48 hours in order for your provider to thoroughly review all the results before contacting the office for clarification of your results.   Thank you for trusting me with your gastrointestinal care!   Alcide Evener, CRNP

## 2022-07-22 NOTE — Progress Notes (Signed)
07/22/2022 Hannah Orozco [id number] Jul 18, 1980   Chief Complaint: Follow up after colonoscopy, persistent LLQ pain   History of Present Illness:  Hannah Orozco [place] is a 42 year old female with a past medical history of anxiety, depression, anemia, mild gestational thrombocytopenia, preeclampsia and GERD.  She speaks Spanish therefore she is accompanied by a [place] Spanish interpreter who was present to facilitate communication throughout today's office visit.  Refer to initial office visit 03/30/2022 for comprehensive history review.  She underwent a colonoscopy due to having chronic LLQ pain 06/08/2022 which showed a normal colon and nonbleeding hemorrhoids.  She was advised to take dicyclomine 3 times daily and to follow-up in our office if her LLQ pain persisted.  She does endorse taking dicyclomine the day before and the day of her colonoscopy and has not taken it since then.  She continues to have the same level of LLQ burning pain which occurs for 1 to 2 days and goes away for a week at a time then recurs.  Activity such as walking sometimes triggers this pain.  Eating or defecation does not trigger or improve her LLQ pain.  No constipation.  She is passing normal formed brown bowel movement daily.  No rectal bleeding or black stools.  She has very mild anemia with a hemoglobin 11.3 -> 11.0.  She denies having any menstrual cycle for the past 10 months has followed by her gynecologist.  [facility] level 148 on 04/22/2022 was suggestive of early menopause.  CTAP 04/06/2022 was unrevealing. No GERD symptoms or dysphagia.       Latest Ref Rng & Units 03/30/2022    3:43 PM 06/24/2021    4:01 PM 07/20/2019   11:05 AM  CBC  WBC 4.0 - 10.5 K/uL 5.8  5.7  5.5   Hemoglobin 12.0 - 15.0 g/dL 65.7  84.6  96.2   Hematocrit 36.0 - 46.0 % 36.2  34.4  39.2   Platelets 150.0 - 400.0 K/uL 186.0  165  169        Latest Ref Rng & Units 03/30/2022    3:43 PM 06/24/2021    4:01 PM 06/03/2016    3:56 PM  CMP   Glucose 70 - 99 mg/dL 91  82  87   BUN 6 - 23 mg/dL 10  9  9    Creatinine 0.40 - 1.20 mg/dL 9.52  8.41  3.24   Sodium 135 - 145 mEq/L 139  138  141   Potassium 3.5 - 5.1 mEq/L 4.1  3.9  4.8   Chloride 96 - 112 mEq/L 102  106  101   CO2 19 - 32 mEq/L 31  28  23    Calcium 8.4 - 10.5 mg/dL 9.3  9.0  9.4   Total Protein 6.0 - 8.3 g/dL 7.7  7.2    Total Bilirubin 0.2 - 1.2 mg/dL 0.2  0.6    Alkaline Phos 39 - 117 U/L 90  60    AST 0 - 37 U/L 16  18    ALT 0 - 35 U/L 10  15      CTAP WITH CONTRAST 04/06/2022:  Lower Chest: No acute findings.   Hepatobiliary: No hepatic masses identified. Gallbladder is unremarkable. No evidence of biliary ductal dilatation.   Pancreas:  No mass or inflammatory changes.   Spleen: Within normal limits in size and appearance.   Adrenals/Urinary Tract: No suspicious masses identified. No evidence of ureteral calculi or hydronephrosis.  Stomach/Bowel: No evidence of obstruction, inflammatory process or abnormal fluid collections.   Vascular/Lymphatic: No pathologically enlarged lymph nodes. No acute vascular findings.   Reproductive:  No mass or other significant abnormality.   Other:  None.   Musculoskeletal:  No suspicious bone lesions identified.   IMPRESSION: Negative. No acute findings or other significant abnormality.   GI PROCEDURES:  Colonoscopy 06/08/2022 by Dr. Meridee Score: - The examined portion of the ileum was normal.  - Normal mucosa in the entire examined colon.  - Non-bleeding non-thrombosed internal hemorrhoids.  EGD in [place] in 2004, results unknown.  EGD by Dr. Dorena Cookey 05/10/2003 due to having dysphagia and manometry showed probable achalasia. She underwent Botox injections and her dysphagia abated.   Current Outpatient Medications on File Prior to Visit  Medication Sig Dispense Refill   famotidine (PEPCID) 20 MG tablet TAKE 1 TABLET BY MOUTH TWICE DAILY AS NEEDED FOR HEARTBURN OR INDIGESTION 30 tablet 4   cefdinir  (OMNICEF) 300 MG capsule Take 1 capsule (300 mg total) by mouth 2 (two) times daily. (Patient not taking: Reported on 07/22/2022) 20 capsule 0   dicyclomine (BENTYL) 10 MG capsule Take 1 capsule (10 mg total) by mouth 1 to 3 times daily as needed and use at time of pain. (Patient not taking: Reported on 07/22/2022) 30 capsule 6   ibuprofen (ADVIL) 600 MG tablet Take 1 tablet (600 mg total) by mouth every 6 (six) hours as needed. (Patient not taking: Reported on 07/22/2022) 30 tablet 0   No current facility-administered medications on file prior to visit.   No Known Allergies  Current Medications, Allergies, Past Medical History, Past Surgical History, Family History and Social History were reviewed in [employer] record.   Review of Systems:   Constitutional: Negative for fever, sweats, chills or weight loss.  Respiratory: Negative for shortness of breath.   Cardiovascular: Negative for chest pain, palpitations and leg swelling.  Gastrointestinal: See HPI.  Musculoskeletal: Negative for back pain or muscle aches.  Neurological: Negative for dizziness, headaches or paresthesias.    Physical Exam: BP 98/60 (BP Location: Left Arm, Patient Position: Sitting, Cuff Size: Normal)   Pulse 72   Ht 4\' 10"  (1.473 m)   Wt 112 lb (50.8 kg)   SpO2 92%   BMI 23.41 kg/m   General: in no acute distress. Head: Normocephalic and atraumatic. Eyes: No scleral icterus. Conjunctiva pink . Ears: Normal auditory acuity. Mouth: Dentition intact. No ulcers or lesions.  Lungs: Clear throughout to auscultation. Heart: Regular rate and rhythm, no murmur. Abdomen: Soft, nontender and nondistended. No masses or hepatomegaly. Normal bowel sounds x 4 quadrants.  Rectal: Deferred.  Musculoskeletal: Symmetrical with no gross deformities. Extremities: No edema. Neurological: Alert oriented x 4. No focal deficits.  Psychological: Alert and cooperative. 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 Normal colonoscopy 05/2022. CTAP 06/2021 was negative.  -Dicyclomine 10mg  one tab bid x 2 weeks, may take tid if needed -If no improvement will try low dose Amitriptyline  -Patient to contact office if symptoms persist or worsen -Continue follow up with GYN  Very mild anemia, no overt GI bleeding.  Absent menstrual cycles x 10 months.  -CBC, iron panel and vitamin B12 level   Today's encounter was 25 minutes which included precharting, chart/result review, history/exam, face-to-face time used for counseling, formulating a treatment plan with follow-up and documentation.

## 2022-07-23 NOTE — Progress Notes (Signed)
Attending Physician's Attestation   I have reviewed the chart.   I agree with the Advanced Practitioner's note, impression, and recommendations with any updates as below. Agree with Bentyl trial again and if not helpful the TCA.  If still not helpful, then repeat CTAP.   Corliss Parish, MD [facility] Office # [phone]

## 2022-07-29 ENCOUNTER — Other Ambulatory Visit: Payer: Self-pay

## 2022-07-29 ENCOUNTER — Telehealth: Payer: Self-pay

## 2022-07-29 NOTE — Telephone Encounter (Signed)
Contacted [facility] Interpreter services. Interpreter contacted pt & pt verbalized understanding of lab results and is aware to follow up with PCP.

## 2022-08-03 ENCOUNTER — Ambulatory Visit: Payer: Self-pay | Admitting: Nurse Practitioner

## 2022-08-04 ENCOUNTER — Encounter: Payer: Self-pay | Admitting: Family Medicine

## 2022-08-04 ENCOUNTER — Other Ambulatory Visit: Payer: Self-pay

## 2022-08-04 ENCOUNTER — Ambulatory Visit (INDEPENDENT_AMBULATORY_CARE_PROVIDER_SITE_OTHER): Payer: Self-pay | Admitting: Family Medicine

## 2022-08-04 VITALS — BP 92/67 | HR 69 | Ht <= 58 in | Wt 110.6 lb

## 2022-08-04 DIAGNOSIS — G8929 Other chronic pain: Secondary | ICD-10-CM

## 2022-08-04 DIAGNOSIS — R1032 Left lower quadrant pain: Secondary | ICD-10-CM

## 2022-08-04 MED ORDER — AMITRIPTYLINE HCL 10 MG PO TABS
10.0000 mg | ORAL_TABLET | Freq: Every day | ORAL | 0 refills | Status: DC
  Filled 2022-08-04: qty 30, 30d supply, fill #0

## 2022-08-04 NOTE — Progress Notes (Signed)
    SUBJECTIVE:   CHIEF COMPLAINT / HPI:   Patient presents for follow up.   States she saw GI and colonoscopy was normal. Still have LLQ pain which has been going on more than a year. Has Bms every day. Trialed Dicyclomine for 2 weeks but states it did not help. Per GI they recommended  trying Amitriptyline if no improvement. She requested to see gyn but wants to see if they would take the financial card    PERTINENT  PMH / PSH: Reviewed   OBJECTIVE:   BP 92/67   Pulse 69   Ht 4\' 10"  (1.473 m)   Wt 110 lb 9.6 oz (50.2 kg)   SpO2 100%   BMI 23.12 kg/m    Physical exam General: well appearing, NAD Cardiovascular: RRR, no murmurs Lungs: CTAB. Normal WOB Abdomen: soft, non-distended, LLQ tender to moderate palpation without guarding rebound tenderness  Skin: warm, dry. No edema  ASSESSMENT/PLAN:   LLQ pain Patient with continued LLQ pain for over a year. Colonoscopy unremarkable. Following with GI. Not improved on Bentyl. Will trial Amitriptyline 10mg  at night. Referral placed to gyn per patient's request.     [name], DO [facility]

## 2022-08-04 NOTE — Patient Instructions (Addendum)
It was great seeing you today!  For your stomach pain we are starting you on a low dose medication called amitriptyline which she will take once a day in the evenings.  You may have some nausea or GI side effects which should improve with time, and if you are able to tolerate the medicine we can increase the dose.  I have also referred you to gynecology, they will call you to schedule an appointment once the referral is processed.  Visit Reminders: - Stop by the pharmacy to pick up your prescriptions  - Continue to work on your healthy eating habits and incorporating exercise into your daily life.   Feel free to call with any questions or concerns at any time, at [phone].   Take care,  Dr. [name] [facility]  Fue genial verte hoy!  Para su dolor de estmago, le comenzaremos con un medicamento en dosis bajas llamado amitriptilina, que tomar una vez al da por las noches. Es posible que tenga algunas nuseas o efectos secundarios gastrointestinales que deberan mejorar con el tiempo y, si puede tolerar el medicamento, podemos aumentar la dosis.  Tambin te he derivado a Print production planner, te llamarn para agendar una cita una vez tramitada la derivacin.   Recordatorios de visita: - Pasa por la farmacia a recoger tus recetas - Continuar trabajando en tus hbitos alimentarios saludables e incorporando el ejercicio a tu vida diaria.  Si tiene alguna pregunta o inquietud, no dude en llamarnos en cualquier momento al [phone].   Cuidarse, Dra. [name] Cent[facility] de medicina familiar de [place]  Amitriptyline Tablets Qu es este medicamento? La AMITRIPTILINA trata la depresin. Aumenta la cantidad de serotonina y norepinefrina en el cerebro, hormonas que ayudan a regular el estado de nimo. Pertenece a un grupo de medicamentos llamados antidepresivos tricclicos. Este medicamento puede ser utilizado para otros usos; si tiene alguna  pregunta consulte con su proveedor de atencin mdica o con su farmacutico. MARCAS COMUNES: Elavil, Vanatrip Qu le debo informar a mi profesional de la salud antes de tomar este medicamento? Necesitan saber si usted presenta alguno de los siguientes problemas o situaciones: Problemas con el alcohol Asma, dificultad para respirar Trastorno bipolar o esquizofrenia Dificultad para Geographical information systems officer, problemas de prstata Glaucoma Enfermedad cardiaca o ataque cardiaco previo Enfermedad heptica Tiroides demasiado activa Convulsiones Ideas o planes de suicidio, un intento previo de suicidio o antecedentes familiares de intento de suicidio [place] reaccin alrgica o inusual al amitriptilina, a otros medicamentos, alimentos, colorantes o conservantes Si est embarazada o buscando quedar embarazada Si est amamantando a un beb Cmo debo utilizar este medicamento? [employer] medicamento por va oral con un poco de [place]. Siga las instrucciones de la etiqueta del [place]. Puede tomar las tabletas con o sin alimentos. Use su medicamento a intervalos regulares. No lo use con una frecuencia mayor a la indicada. No deje de usar PPL Corporation de repente a menos que as lo indique su equipo de atencin. Dejar de Chemical engineer este medicamento demasiado rpido puede causar efectos secundarios graves o podra empeorar su afeccin. Su farmacutico le dar una Gua del medicamento especial (MedGuide, nombre en ingls) con cada receta y en cada ocasin que la vuelva a surtir. Asegrese de leer esta informacin cada vez cuidadosamente. Hable con su equipo de atencin sobre el uso de este medicamento en nios. Puede requerir atencin especial. Sobredosis: Pngase en contacto inmediatamente con un centro toxicolgico o una sala de urgencia si usted cree que haya tomado  demasiado medicamento. ATENCIN: [employer] es solo para usted. No comparta este medicamento con nadie. Qu sucede si me olvido de una dosis? Si olvida  una dosis, tmela lo antes posible. Si es casi la hora de la prxima dosis, tome slo esa dosis. No tome dosis adicionales o dobles. Qu puede interactuar con este medicamento? No use este medicamento con ninguno de los siguientes productos: Trixido de arsnico Ciertos medicamentos usados para regular la frecuencia cardiaca anormal o tratar otras afecciones cardacas Cisaprida Droperidol Halofantrina Linezolida IMAO, tales como [place], Eldepryl, [place], [place] y Parnate Azul de metileno Otros medicamentos para la depresin mental Fenotiazinas, tales como perfenacina, tioridazina y Barista Pimozida Probucol Procarbazina Esparfloxacino hierba de [place] Este medicamento tambin podra Product/process development scientist con los siguientes productos: Atropina y Patent attorney, tales como hiosciamina, escopolamina, tolterodina y Sales executive para inducir el sueo o para el tratamiento de convulsiones, como el fenobarbital Cimetidina Disulfiram Etclorvinol Hormonas tiroideas, como levotiroxina Ziprasidona Puede ser que esta lista no menciona todas las posibles interacciones. Informe a su profesional de [employer] de [employer] Inc productos a base de hierbas, medicamentos de [place] o suplementos nutritivos que est tomando. Si usted fuma, consume bebidas alcohlicas o si utiliza drogas ilegales, indqueselo tambin a su profesional de [employer]. Algunas sustancias pueden interactuar con su medicamento. A qu debo estar atento al usar PPL Corporation? Si los sntomas no mejoran o si empeoran, consulte con su equipo de atencin. Visite a su equipo de atencin para que revise su evolucin peridicamente. Debido a que podran necesitarse varias semanas para ver los efectos completos de [place], es importante que contine con el tratamiento segn las indicaciones de su equipo de atencin. Los pacientes y sus familias deben prestar atencin para Landscape architect aparicin o el empeoramiento  de la depresin o las ideas suicidas. Tambin es necesario estar atento ante Medtronic, tales como sentirse ansioso, agitado, con pnico, irritable, hostil, agresivo, impulsivo, muy inquieto, excitado en exceso e hiperactivo, o no poder dormir. Si esto sucede, especialmente en el comienzo del tratamiento o despus de un cambio de dosis, llame a su equipo de atencin. Puede experimentar somnolencia o mareos. No conduzca, no utilice maquinaria ni haga nada que Scientist, research (life sciences) en estado de alerta hasta que sepa cmo le afecta este medicamento. No se siente ni se ponga de pie con rapidez, especialmente si es un paciente de edad avanzada. Esto reduce el riesgo de mareos o [employer]. El alcohol podra interferir con el efecto de [place]. Evite consumir bebidas alcohlicas. No se trate usted mismo si tiene tos, resfro o alergias sin Science writer con su equipo de atencin. Algunos ingredientes pueden aumentar los posibles efectos secundarios. Se le podra secar la boca. Masticar chicle sin azcar o chupar caramelos duros, y tomar agua en abundancia podra ser de [place]. Si el problema no desaparece o es grave, consulte a su equipo de atencin. Este medicamento puede resecarle los ojos y provocar visin borrosa. Si [place] lentes de contacto, puede sentir ciertas molestias. Las gotas lubricantes pueden ser tiles. Si el problema no desaparece o es grave, consulte a su mdico de los ojos. Este medicamento puede causar estreimiento. Trate de evacuar al menos cada 2 a 3 das. Si no evacua los intestinos [place], llame a su equipo de atencin. Este medicamento puede aumentar su sensibilidad al sol. Evite la [employer]. Si no la Network engineer, utilice ropa protectora y crema de Orthoptist. No utilice lmparas solares, camas solares ni  cabinas solares. Qu efectos secundarios puedo tener al [employer] este medicamento? Efectos secundarios que debe informar a su equipo de atencin tan pronto  como sea posible: Reacciones alrgicas: erupcin cutnea, comezn/picazn, urticaria, hinchazn de la cara, los labios, la lengua o la garganta Cambios en el ritmo cardiaco: frecuencia cardiaca rpida o irregular, mareos, sensacin de desmayo o aturdimiento, Journalist, newspaper, dificultad para respirar [place] serotoninrgico (de la serotonina): irritabilidad, confusin, frecuencia cardiaca rpida o irregular, rigidez muscular, tics musculares, sudoracin, fiebre alta, convulsiones, escalofros, vmito, diarrea Dolor repentino en los ojos o cambio en la visin como visin borrosa, ver halos alrededor Assurant, prdida de visin Ideas suicidas o de autolesionarse, empeoramiento del [place] de nimo, sensacin de depresin Efectos secundarios que generalmente no requieren atencin mdica (debe informarlos a su equipo de atencin si persisten o si son molestos): Cambios en el apetito o el peso Cambios en el deseo o desempeo sexual Estreimiento Research scientist (life sciences) Somnolencia [place] seca Temblores Puede ser que esta lista no menciona todos los posibles efectos secundarios. Comunquese a su mdico por asesoramiento mdico Hewlett[employer]ard. Usted puede informar los efectos secundarios a la FDA por telfono al 1-800-FDA-1088. Dnde debo guardar mi medicina? Mantenga fuera del alcance de nios y Neurosurgeon. Guarde a San[employer]I, entre 20 y 25 grados Celsius (68 y 24 grados Fahrenheit). Deseche todo el medicamento que no haya utilizado despus de la fecha de vencimiento. ATENCIN: Este folleto es un resumen. Puede ser que no cubra toda la posible informacin. Si usted tiene preguntas acerca de esta medicina, consulte con su mdico, su farmacutico o su profesional de Radiographer, therapeutic.  2023 Elsevier/Gold Standard (2020-10-01 00:00:00)

## 2022-08-06 DIAGNOSIS — R1032 Left lower quadrant pain: Secondary | ICD-10-CM | POA: Insufficient documentation

## 2022-08-06 NOTE — Assessment & Plan Note (Signed)
Patient with continued LLQ pain for over a year. Colonoscopy unremarkable. Following with GI. Not improved on Bentyl. Will trial Amitriptyline 10mg  at night. Referral placed to gyn per patient's request.

## 2022-08-16 ENCOUNTER — Other Ambulatory Visit (HOSPITAL_BASED_OUTPATIENT_CLINIC_OR_DEPARTMENT_OTHER): Payer: Self-pay

## 2022-08-31 ENCOUNTER — Other Ambulatory Visit: Payer: Self-pay | Admitting: Family Medicine

## 2022-08-31 ENCOUNTER — Other Ambulatory Visit: Payer: Self-pay

## 2022-08-31 MED ORDER — AMITRIPTYLINE HCL 10 MG PO TABS
10.0000 mg | ORAL_TABLET | Freq: Every day | ORAL | 0 refills | Status: DC
  Filled 2022-08-31 – 2022-09-20 (×3): qty 30, 30d supply, fill #0

## 2022-09-01 ENCOUNTER — Other Ambulatory Visit: Payer: Self-pay

## 2022-09-07 ENCOUNTER — Ambulatory Visit: Payer: Self-pay | Admitting: Obstetrics and Gynecology

## 2022-09-08 ENCOUNTER — Other Ambulatory Visit: Payer: Self-pay

## 2022-09-09 ENCOUNTER — Other Ambulatory Visit: Payer: Self-pay

## 2022-09-17 ENCOUNTER — Other Ambulatory Visit: Payer: Self-pay

## 2022-09-20 ENCOUNTER — Other Ambulatory Visit: Payer: Self-pay

## 2022-09-21 ENCOUNTER — Other Ambulatory Visit: Payer: Self-pay

## 2022-10-22 ENCOUNTER — Ambulatory Visit: Payer: Self-pay | Admitting: Nurse Practitioner

## 2022-11-13 ENCOUNTER — Ambulatory Visit (HOSPITAL_COMMUNITY)
Admission: EM | Admit: 2022-11-13 | Discharge: 2022-11-13 | Disposition: A | Discharge status: Home / Self Care | Payer: Self-pay | Attending: Emergency Medicine | Admitting: Emergency Medicine

## 2022-11-13 ENCOUNTER — Encounter (HOSPITAL_COMMUNITY): Payer: Self-pay

## 2022-11-13 DIAGNOSIS — J069 Acute upper respiratory infection, unspecified: Secondary | ICD-10-CM | POA: Insufficient documentation

## 2022-11-13 DIAGNOSIS — Z1152 Encounter for screening for COVID-19: Secondary | ICD-10-CM | POA: Insufficient documentation

## 2022-11-13 DIAGNOSIS — J029 Acute pharyngitis, unspecified: Secondary | ICD-10-CM | POA: Insufficient documentation

## 2022-11-13 LAB — POCT RAPID STREP A (OFFICE): Rapid Strep A Screen: NEGATIVE

## 2022-11-13 NOTE — Discharge Instructions (Signed)
Su prueba rpida de estreptococos dio negativo, la enviaremos para que se realice un cultivo y nos comunicaremos con usted si se indican antibiticos. Tambin le hemos realizado un hisopado para Engineer, manufacturing COVID-19 y nos comunicaremos con usted si los [place] son positivos. Para dolores corporales, fiebre y [place], puede alternar entre 800 mg de ibuprofeno y 500 mg de Tylenol. Tambin puede hacer grgaras con solucin salina tibia y dormir con un humidificador.  Regrese a la clnica si no mejora los sntomas en la prxima semana o si desarrolla algn sntoma nuevo que le preocupe.  Your rapid strep test was negative, we are sending it off for culture and we will contact you if antibiotics are indicated.  We have also swabbed you for COVID-19 and we will contact you if your results are positive.  For body aches, fever and pains you can alternate between 800 mg of ibuprofen and 500 mg of Tylenol. You can do warm saline gargles and sleep with a humidifier as well.   Return to clinic if no improvement in symptoms in the next week or if you develop any new concerning symptoms.

## 2022-11-13 NOTE — ED Provider Notes (Signed)
[facility]    CSN: [id number] Arrival date & time: 11/13/22  1026      History   Chief Complaint Chief Complaint  Patient presents with   Sore Throat    HPI Hannah Orozco Hannah Orozco is a 42 y.o. female.   Patient presents to clinic for complaint of sore throat, body aches, and fatigue for the past 2 days.  Her daughter recently tested positive for strep, and then also tested positive for COVID-19.  She denies any cough, congestion, wheezing, shortness of breath or chest pain.  Has not tried any interventions for her symptoms.  She would like a strep test.     The history is provided by the patient and medical records.  Sore Throat Pertinent negatives include no chest pain and no shortness of breath.    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 Beverely Low - pager [phone]) Please also call Dr. 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 opened up esophagus so she could eat/swallow    OB History     Gravida  3   Para  3   Term  3   Preterm      AB      Living  3      SAB      IAB      Ectopic      Multiple  0   Live Births  3            Home Medications    Prior to Admission medications   Medication Sig Start Date End Date Taking? Authorizing Provider  famotidine (PEPCID) 20 MG tablet TAKE 1 TABLET BY MOUTH TWICE DAILY AS NEEDED FOR HEARTBURN OR INDIGESTION 09/24/19  Yes Leticia Penna N, DO  amitriptyline (ELAVIL) 10 MG tablet Take 1 tablet (10 mg total) by mouth at bedtime. 08/31/22   [name], DO  cefdinir (OMNICEF) 300 MG capsule Take 1 capsule (300 mg total) by mouth 2 (two) times daily. Patient not taking: Reported on 07/22/2022 06/02/22   Westley Chandler, MD  dicyclomine (BENTYL) 10 MG capsule Take 1 capsule (10 mg total) by mouth 1 to 3 times daily as needed and use at time of pain. Patient  not taking: Reported on 07/22/2022 06/08/22   Mansouraty, Netty Starring., MD  dicyclomine (BENTYL) 10 MG capsule Take 1 capsule (10 mg total) by mouth 2 (two) times daily. You may take three times daily if needed. 07/22/22   Arnaldo Natal, NP  ibuprofen (ADVIL) 600 MG tablet Take 1 tablet (600 mg total) by mouth every 6 (six) hours as needed. Patient not taking: Reported on 07/22/2022 08/30/20   Long, Arlyss Repress, MD    Family History Family History  Problem Relation Age of Onset   Hypertension Mother    Diabetes Mother    Hypertension Father    Hearing loss Neg Hx    Asthma Neg Hx    Cancer Neg Hx    Heart disease Neg Hx    Stroke Neg Hx    Breast cancer Neg Hx    Esophageal cancer Neg Hx    Colon cancer Neg Hx    Stomach cancer Neg Hx     Social History Social History   Tobacco Use   Smoking status: Never    Passive exposure: Never   Smokeless tobacco: Never  Vaping  Use   Vaping status: Never Used  Substance Use Topics   Alcohol use: No   Drug use: No     Allergies   Patient has no known allergies.   Review of Systems Review of Systems  Constitutional:  Positive for fatigue. Negative for fever.  HENT:  Positive for sore throat. Negative for congestion.   Respiratory:  Negative for cough, shortness of breath and wheezing.   Cardiovascular:  Negative for chest pain.     Physical Exam Triage Vital Signs ED Triage Vitals  Encounter Vitals Group     BP 11/13/22 1100 95/69     Systolic BP Percentile --      Diastolic BP Percentile --      Pulse Rate 11/13/22 1100 73     Resp --      Temp 11/13/22 1100 (!) 97.5 F (36.4 C)     Temp Source 11/13/22 1100 Oral     SpO2 11/13/22 1100 98 %     Weight 11/13/22 1057 108 lb (49 kg)     Height 11/13/22 1057 4\' 10"  (1.473 m)     Head Circumference --      Peak Flow --      Pain Score 11/13/22 1055 8     Pain Loc --      Pain Education --      Exclude from Growth Chart --    No data found.  Updated Vital Signs BP 95/69 (BP Location: Left Arm)   Pulse 73   Temp (!) 97.5 F (36.4 C) (Oral)   Ht 4\' 10"  (1.473 m)   Wt 108 lb (49 kg)   LMP 11/12/2021   SpO2 98%   BMI 22.57 kg/m   Visual Acuity Right Eye Distance:   Left Eye Distance:   Bilateral Distance:    Right Eye Near:   Left Eye Near:    Bilateral Near:     Physical Exam Vitals and nursing note reviewed.  Constitutional:      Appearance: Normal appearance. She is well-developed.  HENT:     Head: Normocephalic and atraumatic.     Right Ear: External ear normal.     Left Ear: External ear normal.     Nose: No congestion.     Mouth/Throat:     Mouth: Mucous membranes are moist.  Pharynx: Uvula midline. Posterior oropharyngeal erythema present.     Tonsils: No tonsillar exudate or tonsillar abscesses. 1+ on the right. 1+ on the left.  Eyes:     Conjunctiva/sclera: Conjunctivae normal.  Cardiovascular:     Rate and  Rhythm: Normal rate and regular rhythm.     Heart sounds: Normal heart sounds. No murmur heard. Pulmonary:     Effort: Pulmonary effort is normal. No respiratory distress.     Breath sounds: Normal breath sounds. No stridor.  Musculoskeletal:        General: Normal range of motion.     Cervical back: Normal range of motion.  Skin:    General: Skin is warm and dry.     Capillary Refill: Capillary refill takes less than 2 seconds.  Neurological:     General: No focal deficit present.     Mental Status: She is alert and oriented to person, place, and time.  Psychiatric:        Mood and Affect: Mood normal.        Behavior: Behavior normal.      UC Treatments / Results  Labs (all labs ordered are listed, but only abnormal results are displayed) Labs Reviewed  SARS CORONAVIRUS 2 (TAT 6-24 HRS)  CULTURE, GROUP A STREP [facility])  POCT RAPID STREP A (OFFICE)    EKG   Radiology No results found.  Procedures Procedures (including critical care time)  Medications Ordered in UC Medications - No data to display  Initial Impression / Assessment and Plan / UC Course  I have reviewed the triage vital signs and the nursing notes.  Pertinent labs & imaging results that were available during my care of the patient were reviewed by me and considered in my medical decision making (see chart for details).  Vitals and triage reviewed, patient is hemodynamically stable. Staff performed rapid strep, negative, will send for culture d/t exposure.  Lungs vesicular, heart w/ RRR. Posterior pharynx with erythema, no exudate. Symptoms consistent with viral illness, COVID-19 swab obtained.  Symptomatic management discussed, plan of care, follow-up care and return precautions given.  No questions at this time.      Final Clinical Impressions(s) / UC Diagnoses   Final diagnoses:  Pharyngitis, unspecified etiology  Viral URI     Discharge Instructions      Su prueba rpida de estreptococos  dio negativo, la enviaremos para que se realice un cultivo y nos comunicaremos con usted si se indican antibiticos. Tambin le hemos realizado un hisopado para Engineer, manufacturing COVID-19 y nos comunicaremos con usted si los [place] son positivos. Para dolores corporales, fiebre y [place], puede alternar entre 800 mg de ibuprofeno y 500 mg de Tylenol. Tambin puede hacer grgaras con solucin salina tibia y dormir con un humidificador.  Regrese a la clnica si no mejora los sntomas en la prxima semana o si desarrolla algn sntoma nuevo que le preocupe.  Your rapid strep test was negative, we are sending it off for culture and we will contact you if antibiotics are indicated.  We have also swabbed you for COVID-19 and we will contact you if your results are positive.  For body aches, fever and pains you can alternate between 800 mg of ibuprofen and 500 mg of Tylenol. You can do warm saline gargles and sleep with a humidifier as well.   Return to clinic if no improvement in symptoms in the next week or if you develop any new concerning symptoms.  ED Prescriptions   None    PDMP not reviewed this encounter.   Mistey Hoffert, [place] N, [place] 11/13/22 1136

## 2022-11-13 NOTE — ED Triage Notes (Signed)
Used interpreter Derek Mound [phone] and Reuel Boom [phone]  Pt c/o sore throat, pain when swallowing, neck painand body aches. x2days  Pt was around someone who was sick.  Pt asks for a strep test to be done

## 2022-11-14 LAB — SARS CORONAVIRUS 2 (TAT 6-24 HRS): SARS Coronavirus 2: NEGATIVE

## 2022-11-15 LAB — CULTURE, GROUP A STREP (THRC)

## 2022-11-17 ENCOUNTER — Ambulatory Visit (INDEPENDENT_AMBULATORY_CARE_PROVIDER_SITE_OTHER): Payer: Self-pay | Admitting: Family Medicine

## 2022-11-17 VITALS — BP 93/59 | HR 81 | Temp 98.2°F | Ht <= 58 in | Wt 110.4 lb

## 2022-11-17 DIAGNOSIS — J029 Acute pharyngitis, unspecified: Secondary | ICD-10-CM

## 2022-11-17 DIAGNOSIS — B349 Viral infection, unspecified: Secondary | ICD-10-CM

## 2022-11-17 NOTE — Progress Notes (Signed)
    SUBJECTIVE:   CHIEF COMPLAINT / HPI:   Sick symptoms Patient has had sore throat, congestion, headache, and fever to 100.8 this started 8/30.  Her daughter had similar symptoms and tested positive for strep and COVID in the ED.  When patient went to ED, she was negative for both of these.  She has not improved much.  She is taking p.o. okay.  She is concerned of the swabs for strep and the viruses was not done correctly.  No vomiting.  No bowel changes.  No persistent shortness of breath.  OBJECTIVE:   BP (!) 93/59   Pulse 81   Temp 98.2 F (36.8 C) (Oral)   Ht 4\' 10"  (1.473 m)   Wt 110 lb 6 oz (50.1 kg)   LMP 11/12/2021   SpO2 100%   BMI 23.07 kg/m   General: Alert and oriented, in NAD, tired appearing Skin: Warm, dry, and intact without lesions HEENT: NCAT, EOM grossly normal, midline nasal septum, nasal congestion and presence of postnasal drip without oropharyngeal exudates or severe erythema Cardiac: RRR, no m/r/g appreciated Respiratory: CTAB, breathing and speaking comfortably on RA Abdominal: Soft, nontender, nondistended, normoactive bowel sounds Extremities: Moves all extremities grossly equally Neurological: No gross focal deficit Psychiatric: Appropriate mood and affect   ASSESSMENT/PLAN:   Viral process History and exam likely viral process, presumably COVID given close exposure.  Reassured by negative group A strep culture collected on 8/31.  COVID panel at that time could have been inaccurate due to symptom duration.  COVID/flu collected again today.  Also reassured by lack of focal lung findings on exam.  Discussed time course of viral infections and return precautions of continued shortness of breath, continued worsening symptoms, or difficulty taking in fluids.  Patient amenable to plan.  Health maintenance Consider Pap smear at next visit with PCP  Janeal Holmes, MD [facility]

## 2022-11-17 NOTE — Patient Instructions (Addendum)
Probablemente tengas un virus respiratorio como el covid. Hemos enviado hisopos. Puede usar tylenol/motrin para Midwife, ducha de vapor/humidificador para Chief Technology Officer de garganta/congestin y miel con t caliente. Vuelva si no mejora, empeora o tiene dificultad para respirar.  You likely have a respiratory virus like covid. We have sent off swabs. You can use tylenol/motrin for fever/discomfort, steam shower/humidifier for sore throat/congestion, and honey with warm tea. Come back if not getting better, worsening, or having difficulty breathing.

## 2022-11-19 LAB — COVID-19, FLU A+B NAA
Influenza A, NAA: NOT DETECTED
Influenza B, NAA: NOT DETECTED
SARS-CoV-2, NAA: DETECTED — AB

## 2023-01-04 ENCOUNTER — Telehealth: Payer: Self-pay

## 2023-01-04 ENCOUNTER — Other Ambulatory Visit: Payer: Self-pay | Admitting: Obstetrics and Gynecology

## 2023-01-04 DIAGNOSIS — Z1231 Encounter for screening mammogram for malignant neoplasm of breast: Secondary | ICD-10-CM

## 2023-01-04 NOTE — Telephone Encounter (Signed)
Patient telephoned [facility], used interpreter, Madelyn Brunner for screening process. Patient will call back with spouse's income.

## 2023-03-01 ENCOUNTER — Ambulatory Visit: Payer: Self-pay | Admitting: Hematology and Oncology

## 2023-03-01 ENCOUNTER — Ambulatory Visit
Admission: RE | Admit: 2023-03-01 | Discharge: 2023-03-01 | Disposition: A | Discharge status: Home / Self Care | Payer: Self-pay | Source: Ambulatory Visit | Attending: Obstetrics and Gynecology | Admitting: Obstetrics and Gynecology

## 2023-03-01 VITALS — BP 102/69 | Wt 110.0 lb

## 2023-03-01 DIAGNOSIS — Z1231 Encounter for screening mammogram for malignant neoplasm of breast: Secondary | ICD-10-CM

## 2023-03-01 NOTE — Progress Notes (Signed)
[name] is a 42 y.o. female who presents to [facility] clinic today with no complaints .    Pap Smear: Pap not smear completed today. Last Pap smear was 07/20/2019 and was normal. Per patient has no history of an abnormal Pap smear. Last Pap smear result is available in Epic.   Physical exam: Breasts Breasts symmetrical. No skin abnormalities bilateral breasts. No nipple retraction bilateral breasts. No nipple discharge bilateral breasts. No lymphadenopathy. No lumps palpated bilateral breasts.     MS DIGITAL SCREENING TOMO BILATERAL Result Date: 11/23/2021 CLINICAL DATA:  Screening. EXAM: DIGITAL SCREENING BILATERAL MAMMOGRAM WITH TOMOSYNTHESIS AND CAD TECHNIQUE: Bilateral screening digital craniocaudal and mediolateral oblique mammograms were obtained. Bilateral screening digital breast tomosynthesis was performed. The images were evaluated with computer-aided detection. COMPARISON:  Report from right breast ultrasound dated 03/23/2005. ACR Breast Density Category b: There are scattered areas of fibroglandular density. FINDINGS: There are no findings suspicious for malignancy. IMPRESSION: No mammographic evidence of malignancy. A result letter of this screening mammogram will be mailed directly to the patient. RECOMMENDATION: Screening mammogram in one year. (Code:SM-B-01Y) BI-RADS CATEGORY  1: Negative. Electronically Signed   By: Romona Curls M.D.   On: 11/23/2021 09:14      Pelvic/Bimanual Pap is not indicated today    Smoking History: Patient has never smoked and was not referred to quit line.    Patient Navigation: Patient education provided. Access to services provided for patient through [facility] program. Natale Lay interpreter provided. No transportation provided   Colorectal Cancer Screening: Per patient has never had colonoscopy completed No complaints today.    Breast and Cervical Cancer Risk Assessment: Patient does not have family history of breast cancer, known  genetic mutations, or radiation treatment to the chest before age 76. Patient does not have history of cervical dysplasia, immunocompromised, or DES exposure in-utero.  Risk Scores as of Encounter on 03/01/2023     Hannah Orozco           5-year 0.7%   Lifetime 11.13%            Last calculated by Caprice Red, [name] on 03/01/2023 at  9:12 AM          A: B[facility]CP exam without pap smear No complaints with benign exam.   P: Referred patient to the [facility] for a screening mammogram. Appointment scheduled 03/01/2023.  Ilda Basset A, NP 03/01/2023 9:01 AM

## 2023-03-01 NOTE — Patient Instructions (Signed)
Taught Hannah Orozco about self breast awareness and gave educational materials to take home. Patient did not need a Pap smear today due to last Pap smear was in 07/20/2019 per patient.  Let her know [facility] will cover Pap smears every 5 years unless has a history of abnormal Pap smears. Referred patient to the [facility] of [facility] for screening mammogram. Appointment scheduled for 03/01/2023. Patient aware of appointment and will be there. Let patient know will follow up with her within the next couple weeks with results. Hannah Orozco verbalized understanding.  Pascal Lux, NP 9:21 AM

## 2023-03-04 ENCOUNTER — Other Ambulatory Visit: Payer: Self-pay | Admitting: Obstetrics and Gynecology

## 2023-03-04 DIAGNOSIS — R928 Other abnormal and inconclusive findings on diagnostic imaging of breast: Secondary | ICD-10-CM

## 2023-03-15 ENCOUNTER — Ambulatory Visit: Payer: No Typology Code available for payment source

## 2023-03-15 ENCOUNTER — Ambulatory Visit
Admission: RE | Admit: 2023-03-15 | Discharge: 2023-03-15 | Disposition: A | Discharge status: Home / Self Care | Payer: No Typology Code available for payment source | Source: Ambulatory Visit | Attending: Obstetrics and Gynecology | Admitting: Obstetrics and Gynecology

## 2023-03-15 DIAGNOSIS — R928 Other abnormal and inconclusive findings on diagnostic imaging of breast: Secondary | ICD-10-CM

## 2023-04-01 ENCOUNTER — Other Ambulatory Visit: Payer: Self-pay

## 2023-04-01 MED ORDER — CICLOPIROX 8 % EX SOLN
CUTANEOUS | 3 refills | Status: DC
  Filled 2023-04-01: qty 6.6, 30d supply, fill #0
  Filled 2023-05-04: qty 6.6, 30d supply, fill #1
  Filled 2023-05-31: qty 6.6, 30d supply, fill #2
  Filled 2023-10-24: qty 6.6, 30d supply, fill #3

## 2023-04-01 MED ORDER — TERBINAFINE HCL 250 MG PO TABS
250.0000 mg | ORAL_TABLET | Freq: Every day | ORAL | 0 refills | Status: DC
  Filled 2023-04-01: qty 30, 30d supply, fill #0
  Filled 2023-05-04 (×2): qty 30, 30d supply, fill #1
  Filled 2023-05-31: qty 30, 30d supply, fill #2

## 2023-05-04 ENCOUNTER — Other Ambulatory Visit: Payer: Self-pay

## 2023-05-06 ENCOUNTER — Other Ambulatory Visit: Payer: Self-pay

## 2023-05-31 ENCOUNTER — Other Ambulatory Visit: Payer: Self-pay

## 2023-06-01 ENCOUNTER — Other Ambulatory Visit: Payer: Self-pay

## 2023-09-12 IMAGING — CT CT ABD-PELV W/ CM
2 of 5 series · 16 of 46 positions shown, 18 images · IV contrast (Omni 300)
Comparison: None.

CLINICAL DATA: Left-sided abdominal pain for several weeks

EXAM:
CT ABDOMEN AND PELVIS WITH CONTRAST
TECHNIQUE: Multidetector CT imaging of the abdomen and pelvis was performed
using the standard protocol following bolus administration of
intravenous contrast.

[Series 3: a/p w/ 5mm · axial · 0.73mm/px · z∈[+783,+1138]mm · 13 of 79 slices shown, 15 images]
[im 4/79  soft-tissue]
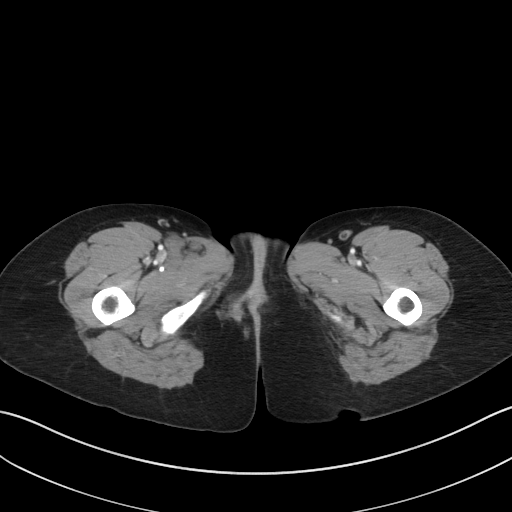
[im 4/79  bone]
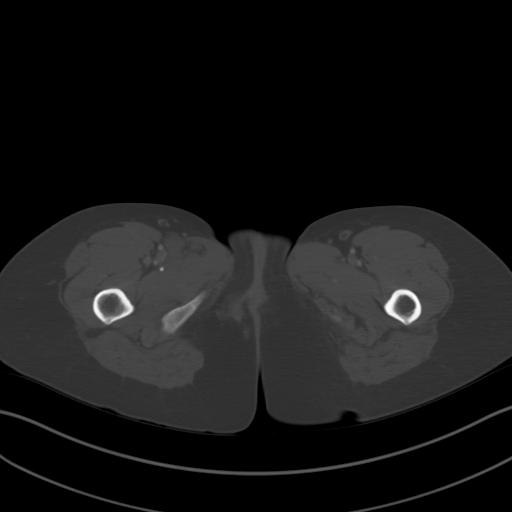
[im 12/79  soft-tissue]
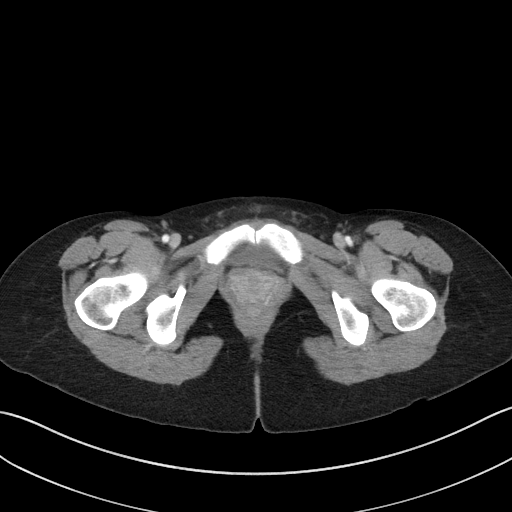
[im 16/79  soft-tissue]
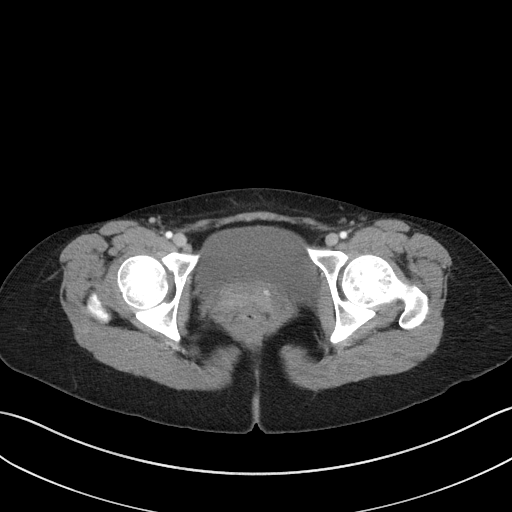
[im 24/79  soft-tissue]
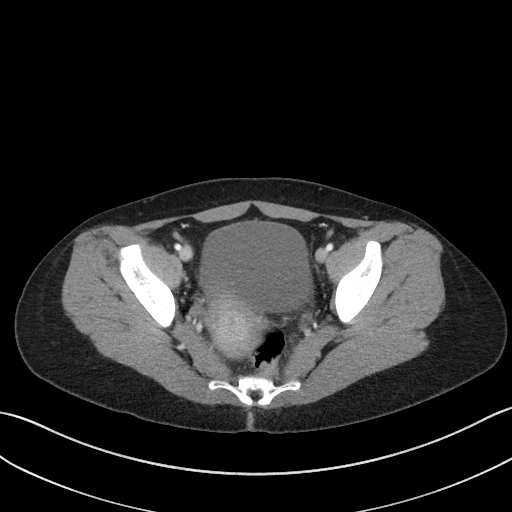
[im 28/79  soft-tissue]
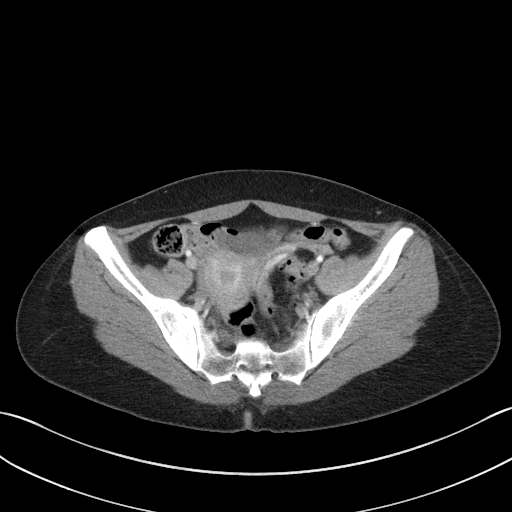
[im 36/79  soft-tissue]
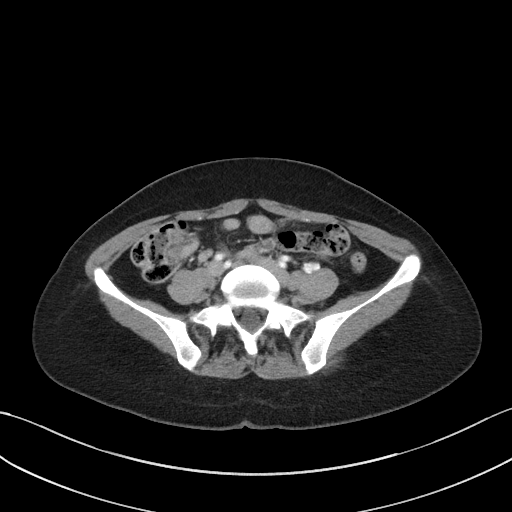
[im 40/79  soft-tissue]
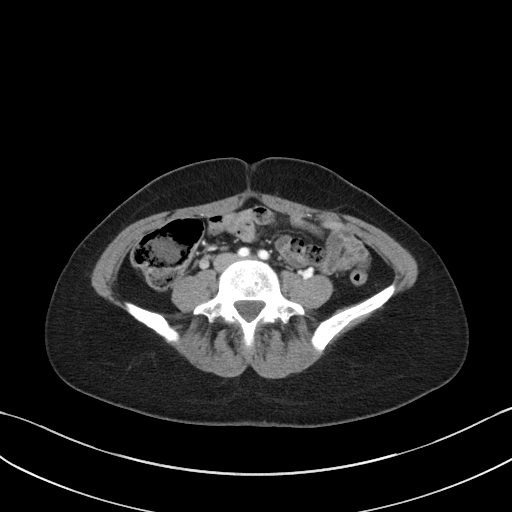
[im 43/79  soft-tissue]
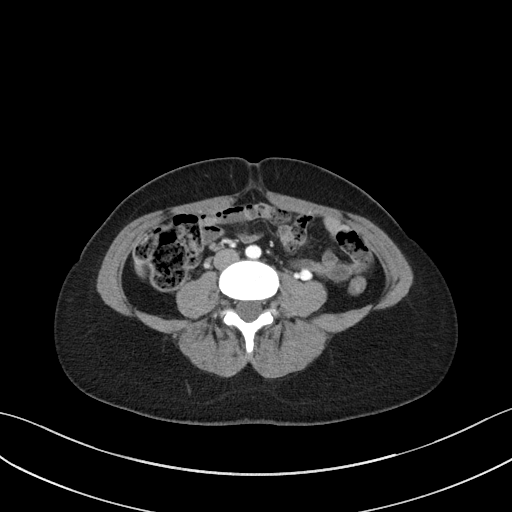
[im 51/79  soft-tissue]
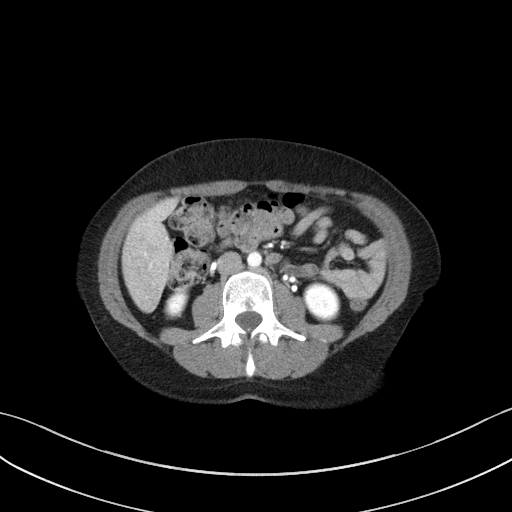
[im 51/79  bone]
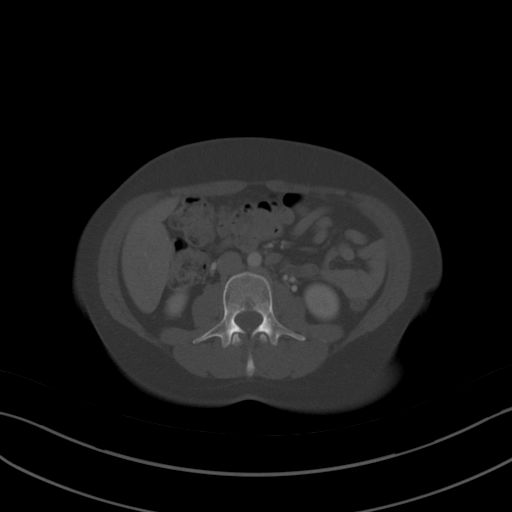
[im 55/79  soft-tissue]
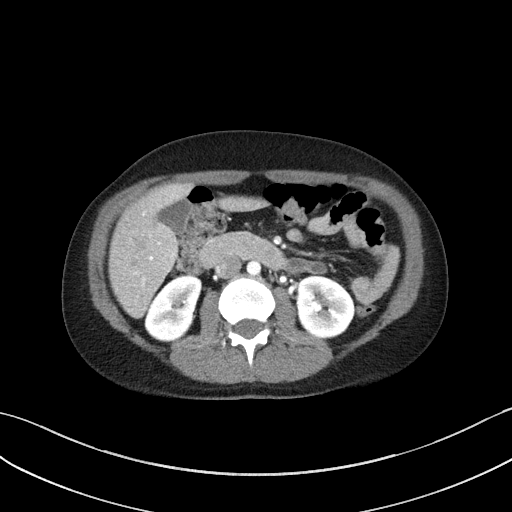
[im 63/79  soft-tissue]
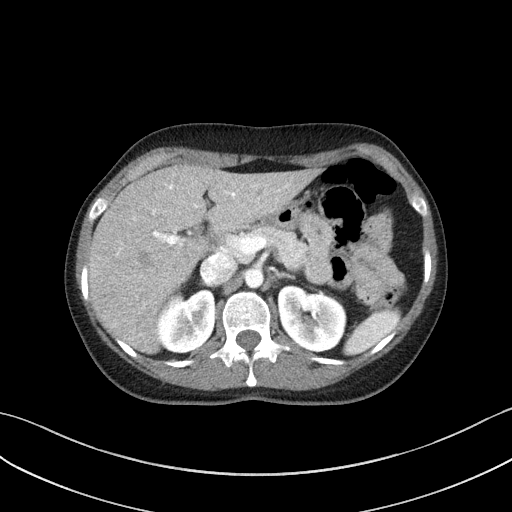
[im 67/79  soft-tissue]
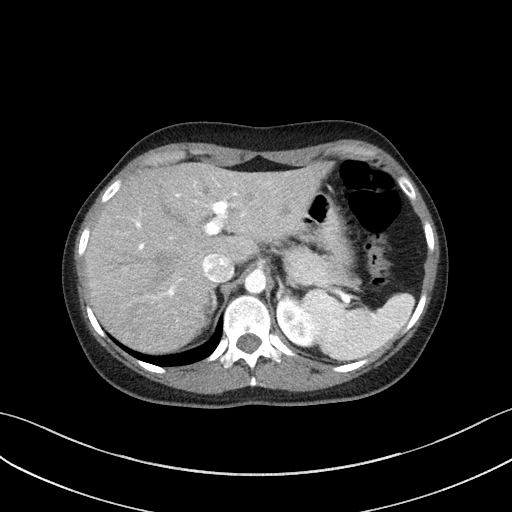
[im 75/79  soft-tissue]
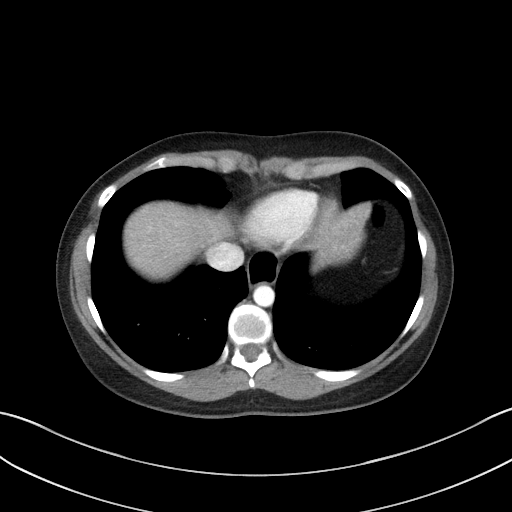

[Series 6: a/p w/ cor · coronal · 0.69mm/px · 3 of 130 slices shown]
[im 44/130  soft-tissue]
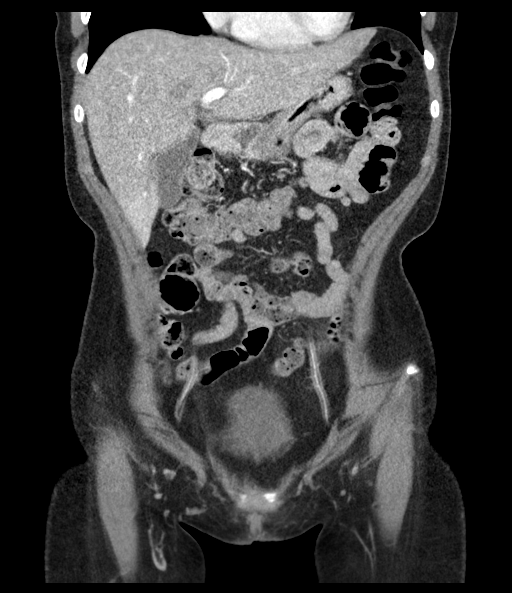
[im 58/130  soft-tissue]
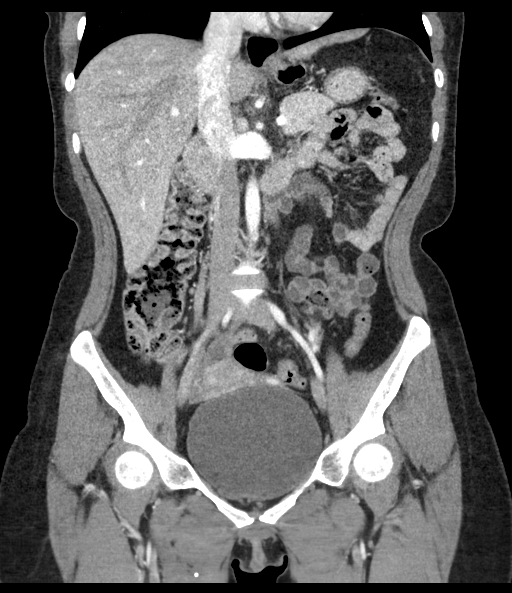
[im 72/130  soft-tissue]
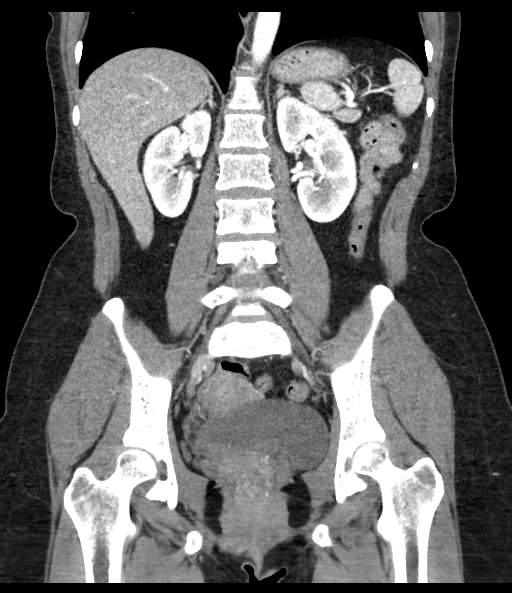

[16 of 46 positions shown; findings below may reference images not displayed]

RADIATION DOSE REDUCTION: This exam was performed according to the
departmental dose-optimization program which includes automated
exposure control, adjustment of the mA and/or kV according to
patient size and/or use of iterative reconstruction technique.

CONTRAST:  75mL OMNIPAQUE IOHEXOL 300 MG/ML  SOLN
FINDINGS: Lower chest: No acute abnormality.

Hepatobiliary: No focal liver abnormality is seen. No gallstones,
gallbladder wall thickening, or biliary dilatation.

Pancreas: Unremarkable. No pancreatic ductal dilatation or
surrounding inflammatory changes.

Spleen: Normal in size without focal abnormality.

Adrenals/Urinary Tract: Adrenal glands are within normal limits.
Kidneys are well visualized bilaterally. Normal excretion is seen
bilaterally. No calculi are noted. The bladder is well distended.

Stomach/Bowel: The appendix is within normal limits. No obstructive
or inflammatory changes of the colon are seen. The stomach and small
bowel are within normal limits.

Vascular/Lymphatic: No significant vascular findings are present. No
enlarged abdominal or pelvic lymph nodes.

Reproductive: Uterus is within normal limits. No adnexal mass is
seen.

Other: No abdominal wall hernia or abnormality. No abdominopelvic
ascites.

Musculoskeletal: No acute or significant osseous findings.
IMPRESSION: No acute abnormality noted to correspond with the given clinical
history.

## 2023-10-14 ENCOUNTER — Other Ambulatory Visit: Payer: Self-pay

## 2023-10-14 MED ORDER — MUPIROCIN 2 % EX OINT
1.0000 | TOPICAL_OINTMENT | Freq: Two times a day (BID) | CUTANEOUS | 3 refills | Status: DC
  Filled 2023-10-14: qty 22, 11d supply, fill #0

## 2023-10-24 ENCOUNTER — Other Ambulatory Visit: Payer: Self-pay

## 2023-11-09 ENCOUNTER — Other Ambulatory Visit: Payer: Self-pay | Admitting: Obstetrics and Gynecology

## 2023-11-09 DIAGNOSIS — Z1231 Encounter for screening mammogram for malignant neoplasm of breast: Secondary | ICD-10-CM

## 2024-01-23 ENCOUNTER — Inpatient Hospital Stay: Payer: Self-pay | Attending: Obstetrics and Gynecology | Admitting: *Deleted

## 2024-01-23 ENCOUNTER — Other Ambulatory Visit: Payer: Self-pay

## 2024-01-23 VITALS — BP 107/82 | Ht <= 58 in | Wt 110.1 lb

## 2024-01-23 DIAGNOSIS — Z0000 Encounter for general adult medical examination without abnormal findings: Secondary | ICD-10-CM

## 2024-01-23 NOTE — Progress Notes (Signed)
 [facility] initial screening   Interpreter- Suzi Hernan, [place]   Clinical Measurement:  Vitals:   01/23/24 0830 01/23/24 0843  BP: 111/78 107/82   Fasting Labs Drawn Today, will review with patient when they result.   Medical History: Patient states that she does not know if she has high cholesterol, does not have high blood pressure and she does not have diabetes. Patient states that she does not have history of gestational hypertension, does not have history of pre-eclampsia/eclampsia and she does not have history of gestational diabetes.    Medications: Patient states that she does not take medication to lower cholesterol, blood pressure or blood sugar.  Patient does not take an aspirin a day to help prevent a heart attack or stroke. During the past 7 days patient N/A taken prescribed medication to lower N/A on N/A days.   Blood pressure, self measurement: Patient states that she does measure blood pressure from home. She checks her blood pressure monthly. She shares her readings with a health care provider: no.   Nutrition: Patient states that on average she eats 1 cups of fruit and 1 cups of vegetables per day. Patient states that she does not eat fish at least 2 times per week. Patient eats less than half servings of whole grains. Patient drinks less than 36 ounces of beverages with added sugar weekly: no. Patient is currently watching sodium or salt intake: yes. In the past 7 days patient has consumed drinks containing alcohol on 0 days. On a day that patient consumes drinks containing alcohol on average 0 drinks are consumed.      Physical activity: Patient states that she gets 40 minutes of physical activity each week.  Smoking status: Patient states that she has has never smoked .   Quality of life: Over the past 2 weeks patient states that she had little interest or pleasure in doing things: not at all. She has been feeling down, depressed or hopeless:not at all.   Social  Determinants of Health Assessment:   Computer Use: During the last 12 months patient states that she has used any of the following: desktop/laptop, smart phone or tablet/other portable wireless computer: yes.   Internet Use: During the last 12 months, did you or any member of your household have access to the internet: Yes, by paying a cell phone company or internet service provider.   Food Insecurities: During the last 12 months, where there any times when you were worried that you would run out of food because of a lack of money or other resources: No.   Transportation Barriers: During the last 12 months, have you missed a doctor's appointment because of transportation problems: No.   Childcare Barriers: If you are currently using childcare services, please identify  the type of services you use. (If not using childcare services, please select Not applicable): not applicable. During the last 12 months, have you had any barriers to childcare services such as: not applicable.   Housing: What is your housing situation today: I have housing.   Intimate Partner Violence: During the last 12 months, how often did your partner physically hurt you: never. During the last 12 months, how often did your partner insult you or talk down to you: never.  Medication Adherence: During the last 12 months, did you ever forget to take your medicine: not applicable. During the last 12 months, were you careless ar times about taking your medicine: not applicable. During the last 12 months, when you felt  better did you sometimes stop taking your medication: not applicable. During the last 12 months, sometimes if you felt worse when you took your medicine did you stop taking it: not applicable.   Risk reduction and counseling:   Health Coaching: Spoke with patient about the daily recommendation for fruits and vegetables. Showed patient what a serving size would look like. Patient consumes fish once per week.  Encouraged her to try and add in an additional serving each week to meet weekly recommendation. Patient consumes oatmeal but no other whole grains. Gave suggestions for others that she can try incorporating into her diet (whole wheat bread, brown rice, whole wheat pasta or whole grain cereals). Spoke with patient about soda intake. See goal below. Patient has been exercising at home 1-2 days per week. Encouraged patient to try and start getting 20-30 minutes of exercise daily if possible.  Goal: Patient will work on reducing her soda intake. Patient currently consumes one can of pepsi daily. Patient will try and reduce her pepsi intake to 4 cans weekly at the start and will work on reducing more once first goal is met.   Navigation:  I will notify patient of lab results.  Patient is aware of 2 more health coaching sessions and a follow up.

## 2024-01-24 LAB — LIPID PANEL
Chol/HDL Ratio: 2.5 ratio (ref 0.0–4.4)
Cholesterol, Total: 183 mg/dL (ref 100–199)
HDL: 72 mg/dL (ref >39)
LDL Chol Calc (NIH): 99 mg/dL (ref 0–99)
Triglycerides: 66 mg/dL (ref 0–149)
VLDL Cholesterol Cal: 12 mg/dL (ref 5–40)

## 2024-01-24 LAB — HEMOGLOBIN A1C
Est. average glucose Bld gHb Est-mCnc: 117 mg/dL
Hgb A1c MFr Bld: 5.7 % — ABNORMAL HIGH (ref 4.8–5.6)

## 2024-01-24 LAB — GLUCOSE, RANDOM: Glucose: 89 mg/dL (ref 70–99)

## 2024-01-30 ENCOUNTER — Ambulatory Visit: Payer: Self-pay

## 2024-01-30 NOTE — Telephone Encounter (Signed)
 Health coaching 2   interpreter- [employer] [phone]   Labs-183 cholesterol, 99 LDL cholesterol, 72 HDL cholesterol, 66 triglycerides,5.7 hemoglobin A1C, 89 mean plasma glucose. Patient understands and is aware of her lab results.   Goals-  1. Watch the amount of sweet and sugary food and drinks consumed.  2. Watch the amount of carb rich foods consumed. 3. Daily exercise for 20-30 minutes.   Navigation:  Patient is aware of 1 more health coaching sessions and a follow up.

## 2024-03-21 NOTE — Progress Notes (Signed)
 [name] is a 44 y.o. female who presents to [facility] clinic today with no complaints.    Pap Smear: Pap smear not completed today. Last Pap smear was 07/20/2019 at [facility] clinic and was normal with negative HPV. Per patient has no history of an abnormal Pap smear. Last Pap smear result is available in Epic.   Physical exam: Breasts Breasts symmetrical. No skin abnormalities bilateral breasts. No nipple retraction bilateral breasts. No nipple discharge bilateral breasts. No lymphadenopathy. No lumps palpated bilateral breasts. No complaints of pain or tenderness on exam.      MM 3D DIAGNOSTIC MAMMOGRAM UNILATERAL LEFT BREAST Result Date: 03/15/2023 CLINICAL DATA:  Patient presents for further evaluation of possible LEFT breast asymmetry. EXAM: DIGITAL DIAGNOSTIC UNILATERAL LEFT MAMMOGRAM WITH TOMOSYNTHESIS AND CAD TECHNIQUE: Left digital diagnostic mammography and breast tomosynthesis was performed. The images were evaluated with computer-aided detection. COMPARISON:  Previous exam(s). ACR Breast Density Category b: There are scattered areas of fibroglandular density. FINDINGS: Additional 2-D and 3-D images are performed. These views show no persistent asymmetry in the UPPER-OUTER QUADRANT of the LEFT breast. No suspicious mass, distortion, or microcalcifications are identified to suggest presence of malignancy. IMPRESSION: No mammographic evidence for malignancy. RECOMMENDATION: Screening mammogram in one year.(Code:SM-B-01Y) I have discussed the findings and recommendations with the patient. If applicable, a reminder letter will be sent to the patient regarding the next appointment. BI-RADS CATEGORY  1: Negative. Electronically Signed   By: [name] M.D.   On: 03/15/2023 10:16   MS 3D SCR MAMMO BILAT BR (aka MM) Result Date: 03/04/2023 CLINICAL DATA:  Screening. EXAM: DIGITAL SCREENING BILATERAL MAMMOGRAM WITH TOMOSYNTHESIS AND CAD TECHNIQUE: Bilateral screening  digital craniocaudal and mediolateral oblique mammograms were obtained. Bilateral screening digital breast tomosynthesis was performed. The images were evaluated with computer-aided detection. COMPARISON:  Previous exam(s). ACR Breast Density Category c: The breasts are heterogeneously dense, which may obscure small masses. FINDINGS: In the left breast, a possible asymmetry warrants further evaluation. In the right breast, no findings suspicious for malignancy. IMPRESSION: Further evaluation is suggested for possible asymmetry in the left breast. RECOMMENDATION: Diagnostic mammogram and possibly ultrasound of the left breast. (Code:[id number]-75M) The patient will be contacted regarding the findings, and additional imaging will be scheduled. BI-RADS CATEGORY  0: Incomplete: Need additional imaging evaluation. Electronically Signed   By: [name] M.D.   On: 03/04/2023 06:46   MS DIGITAL SCREENING TOMO BILATERAL Result Date: 11/23/2021 CLINICAL DATA:  Screening. EXAM: DIGITAL SCREENING BILATERAL MAMMOGRAM WITH TOMOSYNTHESIS AND CAD TECHNIQUE: Bilateral screening digital craniocaudal and mediolateral oblique mammograms were obtained. Bilateral screening digital breast tomosynthesis was performed. The images were evaluated with computer-aided detection. COMPARISON:  Report from right breast ultrasound dated 03/23/2005. ACR Breast Density Category b: There are scattered areas of fibroglandular density. FINDINGS: There are no findings suspicious for malignancy. IMPRESSION: No mammographic evidence of malignancy. A result letter of this screening mammogram will be mailed directly to the patient. RECOMMENDATION: Screening mammogram in one year. (Code:SM-B-01Y) BI-RADS CATEGORY  1: Negative. Electronically Signed   By: [name] M.D.   On: 11/23/2021 09:14   Pelvic/Bimanual Pap is not indicated today per BCCCP guidelines.   Smoking History: Patient has never smoked.   Patient Navigation: Patient education  provided. Access to services provided for patient through [facility] program. Spanish interpreter [name] from [facility] provided.    Breast and Cervical Cancer Risk Assessment: Patient does not have family history of breast cancer, known genetic  mutations, or radiation treatment to the chest before age 2. Patient does not have history of cervical dysplasia, immunocompromised, or DES exposure in-utero.  Risk Scores as of Encounter on 03/22/2024     [name] as of 03/01/2023           5-year 0.7%   Lifetime 11.13%            Last calculated by [name], [name], [name] on 03/01/2023 at  9:12 AM        A: BCCCP exam without pap smear No complaints.   P: Referred patient to the [facility] for a screening mammogram on mobile unit. Appointment scheduled Thursday, March 22, 2024 at 0930.  [name], RN 03/21/2024 9:11 AM

## 2024-03-22 ENCOUNTER — Ambulatory Visit
Admission: RE | Admit: 2024-03-22 | Discharge: 2024-03-22 | Disposition: A | Discharge status: Home / Self Care | Source: Ambulatory Visit | Attending: Obstetrics and Gynecology | Admitting: Obstetrics and Gynecology

## 2024-03-22 ENCOUNTER — Ambulatory Visit: Payer: Self-pay | Admitting: *Deleted

## 2024-03-22 ENCOUNTER — Other Ambulatory Visit: Payer: Self-pay

## 2024-03-22 ENCOUNTER — Ambulatory Visit: Payer: Self-pay | Admitting: Family Medicine

## 2024-03-22 VITALS — BP 97/66 | HR 67 | Temp 98.2°F | Ht <= 58 in | Wt 108.8 lb

## 2024-03-22 VITALS — BP 99/71 | Wt 109.9 lb

## 2024-03-22 DIAGNOSIS — Z1231 Encounter for screening mammogram for malignant neoplasm of breast: Secondary | ICD-10-CM

## 2024-03-22 DIAGNOSIS — J0100 Acute maxillary sinusitis, unspecified: Secondary | ICD-10-CM

## 2024-03-22 DIAGNOSIS — Z1239 Encounter for other screening for malignant neoplasm of breast: Secondary | ICD-10-CM

## 2024-03-22 MED ORDER — FLUTICASONE PROPIONATE 50 MCG/ACT NA SUSP
2.0000 | Freq: Every day | NASAL | 6 refills | Status: AC
  Filled 2024-03-22: qty 16, 30d supply, fill #0

## 2024-03-22 MED ORDER — AMOXICILLIN-POT CLAVULANATE 875-125 MG PO TABS
1.0000 | ORAL_TABLET | Freq: Two times a day (BID) | ORAL | 0 refills | Status: AC
  Filled 2024-03-22: qty 20, 10d supply, fill #0

## 2024-03-22 NOTE — Patient Instructions (Addendum)
 Take augmentin  (antibiotic) twice daily for 10 days  Use flonase  (nasal spray) as prescribed. You can also try over the counter antihistamines such as Zyrtec or Allegra   Tome Augmentin  (antibitico) dos veces al da durante [address]  Use Flonase  (aerosol nasal) segn las indicaciones. Tambin puede probar antihistamnicos de venta libre como Zyrtec o Allegra.

## 2024-03-22 NOTE — Patient Instructions (Signed)
 Explained breast self awareness with Hadassah LITTIE Sudie Meribeth. Patient did not need a Pap smear today due to last Pap smear and HPV typing was 07/20/2019. Let her know B[facility]CP will cover Pap smears and HPV typing every 5 years unless has a history of abnormal Pap smears. Referred patient to the [facility] for a screening mammogram on mobile unit. Appointment scheduled Thursday, March 22, 2024 at 0930. Patient aware of appointment and will be there. Let patient know the Breast Center will follow up with her within the next couple weeks with results of her mammogram by letter or phone. Hadassah LITTIE Sudie Meribeth verbalized understanding.  Mendi Constable, Wanda Ship, RN 8:37 AM

## 2024-03-22 NOTE — Progress Notes (Signed)
" ° ° °  SUBJECTIVE:   CHIEF COMPLAINT / HPI:   Productive cough x3wks  Discussed the use of AI scribe software for clinical note transcription with the patient, who gave verbal consent to proceed.  History of Present Illness Hannah Orozco is a 44 year old female who presents with persistent cough and back pain.  Cough and upper respiratory symptoms - Persistent productive cough for three weeks, occurring day and night - Cough is triggered by an itchy throat - Fever was present initially but has resolved - Intermittent headaches associated with coughing - No improvement with over-the-counter medications, honey, or tea  Exposure history - Husband and children recently diagnosed with influenza     PERTINENT  PMH / PSH: reviewed  OBJECTIVE:   BP 97/66   Pulse 67   Temp 98.2 F (36.8 C)   Ht 4' 9 (1.448 m)   Wt 108 lb 12.8 oz (49.4 kg)   LMP 11/12/2021   SpO2 100%   BMI 23.54 kg/m    General: NAD, pleasant, able to participate in exam HEENT: Bilateral maxillary sinuses tender to palpation.  Frontal sinuses nontender.  Posterior oropharynx erythematous without significant tonsillar swelling or exudate. Cardiac: RRR, no murmurs auscultated Respiratory: CTAB, normal WOB Abdomen: soft, non-tender, non-distended, normoactive bowel sounds Extremities: warm and well perfused, no edema or cyanosis Skin: warm and dry, no rashes noted Neuro: alert, no obvious focal deficits, speech normal Psych: Normal affect and mood  ASSESSMENT/PLAN:    Assessment & Plan Acute non-recurrent maxillary sinusitis Persistent cough and phlegm post-viral infection with sinus tenderness suggests sinusitis. Differential includes post-viral cough. Reassuringly afebrile.  Lungs are clear reassuring against pneumonia. - Prescribed Augmentin . - Prescribed nasal steroid spray. - Recommended OTC antihistamines like cetirizine or allegra. - discussed return precautions and supportive care   Payton Coward, MD [facility] "

## 2024-05-17 ENCOUNTER — Ambulatory Visit
